# Patient Record
Sex: Male | Born: 1945 | Race: White | Hispanic: No | Marital: Married | Smoking: Former smoker
Health system: Southern US, Community
[De-identification: ages and names within clinical notes are randomized; demographics above are authoritative.]

## PROBLEM LIST (undated history)

## (undated) DIAGNOSIS — M1990 Unspecified osteoarthritis, unspecified site: Secondary | ICD-10-CM

## (undated) DIAGNOSIS — E119 Type 2 diabetes mellitus without complications: Secondary | ICD-10-CM

## (undated) DIAGNOSIS — I10 Essential (primary) hypertension: Secondary | ICD-10-CM

## (undated) DIAGNOSIS — I739 Peripheral vascular disease, unspecified: Secondary | ICD-10-CM

## (undated) DIAGNOSIS — H409 Unspecified glaucoma: Secondary | ICD-10-CM

## (undated) DIAGNOSIS — Z8619 Personal history of other infectious and parasitic diseases: Secondary | ICD-10-CM

## (undated) DIAGNOSIS — K219 Gastro-esophageal reflux disease without esophagitis: Secondary | ICD-10-CM

## (undated) DIAGNOSIS — J45909 Unspecified asthma, uncomplicated: Secondary | ICD-10-CM

## (undated) DIAGNOSIS — C801 Malignant (primary) neoplasm, unspecified: Secondary | ICD-10-CM

## (undated) DIAGNOSIS — E785 Hyperlipidemia, unspecified: Secondary | ICD-10-CM

## (undated) HISTORY — PX: HERNIA REPAIR: SHX51

---

## 2006-11-10 ENCOUNTER — Ambulatory Visit: Payer: Self-pay | Admitting: Gastroenterology

## 2014-06-02 ENCOUNTER — Ambulatory Visit: Payer: Medicare Other | Admitting: Anesthesiology

## 2014-06-02 ENCOUNTER — Encounter
Admission: RE | Disposition: A | Discharge status: Home / Self Care | Payer: Self-pay | Source: Ambulatory Visit | Attending: Gastroenterology

## 2014-06-02 ENCOUNTER — Encounter: Payer: Self-pay | Admitting: *Deleted

## 2014-06-02 ENCOUNTER — Ambulatory Visit
Admission: RE | Admit: 2014-06-02 | Discharge: 2014-06-02 | Disposition: A | Discharge status: Home / Self Care | Payer: Medicare Other | Source: Ambulatory Visit | Attending: Gastroenterology | Admitting: Gastroenterology

## 2014-06-02 DIAGNOSIS — K5730 Diverticulosis of large intestine without perforation or abscess without bleeding: Secondary | ICD-10-CM | POA: Insufficient documentation

## 2014-06-02 DIAGNOSIS — K640 First degree hemorrhoids: Secondary | ICD-10-CM | POA: Insufficient documentation

## 2014-06-02 DIAGNOSIS — D123 Benign neoplasm of transverse colon: Secondary | ICD-10-CM | POA: Insufficient documentation

## 2014-06-02 DIAGNOSIS — H409 Unspecified glaucoma: Secondary | ICD-10-CM | POA: Insufficient documentation

## 2014-06-02 DIAGNOSIS — E785 Hyperlipidemia, unspecified: Secondary | ICD-10-CM | POA: Diagnosis not present

## 2014-06-02 DIAGNOSIS — Z1211 Encounter for screening for malignant neoplasm of colon: Secondary | ICD-10-CM | POA: Diagnosis present

## 2014-06-02 DIAGNOSIS — Z87891 Personal history of nicotine dependence: Secondary | ICD-10-CM | POA: Insufficient documentation

## 2014-06-02 DIAGNOSIS — I10 Essential (primary) hypertension: Secondary | ICD-10-CM | POA: Insufficient documentation

## 2014-06-02 DIAGNOSIS — K621 Rectal polyp: Secondary | ICD-10-CM | POA: Diagnosis not present

## 2014-06-02 DIAGNOSIS — Z79899 Other long term (current) drug therapy: Secondary | ICD-10-CM | POA: Diagnosis not present

## 2014-06-02 DIAGNOSIS — E119 Type 2 diabetes mellitus without complications: Secondary | ICD-10-CM | POA: Diagnosis not present

## 2014-06-02 DIAGNOSIS — Z87898 Personal history of other specified conditions: Secondary | ICD-10-CM | POA: Insufficient documentation

## 2014-06-02 DIAGNOSIS — Z7982 Long term (current) use of aspirin: Secondary | ICD-10-CM | POA: Diagnosis not present

## 2014-06-02 DIAGNOSIS — I739 Peripheral vascular disease, unspecified: Secondary | ICD-10-CM | POA: Diagnosis not present

## 2014-06-02 HISTORY — DX: Personal history of other infectious and parasitic diseases: Z86.19

## 2014-06-02 HISTORY — PX: COLONOSCOPY: SHX5424

## 2014-06-02 HISTORY — DX: Type 2 diabetes mellitus without complications: E11.9

## 2014-06-02 HISTORY — DX: Unspecified glaucoma: H40.9

## 2014-06-02 HISTORY — DX: Peripheral vascular disease, unspecified: I73.9

## 2014-06-02 HISTORY — DX: Hyperlipidemia, unspecified: E78.5

## 2014-06-02 HISTORY — DX: Essential (primary) hypertension: I10

## 2014-06-02 LAB — GLUCOSE, CAPILLARY: Glucose-Capillary: 275 mg/dL — ABNORMAL HIGH (ref 70–99)

## 2014-06-02 SURGERY — COLONOSCOPY
Anesthesia: General

## 2014-06-02 MED ORDER — PROPOFOL INFUSION 10 MG/ML OPTIME
INTRAVENOUS | Status: DC | PRN
  Administered 2014-06-02: 75 ug/kg/min via INTRAVENOUS

## 2014-06-02 MED ORDER — LACTATED RINGERS IV SOLN
INTRAVENOUS | Status: DC | PRN
  Administered 2014-06-02: 08:00:00 via INTRAVENOUS

## 2014-06-02 MED ORDER — SODIUM CHLORIDE 0.9 % IV SOLN
INTRAVENOUS | Status: DC
  Administered 2014-06-02: 1000 mL via INTRAVENOUS

## 2014-06-02 MED ORDER — MIDAZOLAM HCL 2 MG/2ML IJ SOLN
INTRAMUSCULAR | Status: DC | PRN
  Administered 2014-06-02: 2 mg via INTRAVENOUS

## 2014-06-02 MED ORDER — SODIUM CHLORIDE 0.9 % IV SOLN: INTRAVENOUS | Status: DC

## 2014-06-02 NOTE — Anesthesia Preprocedure Evaluation (Signed)
Anesthesia Evaluation  Patient identified by MRN, date of birth, ID band Patient awake    Reviewed: Allergy & Precautions, H&P , NPO status , Patient's Chart, lab work & pertinent test results, reviewed documented beta blocker date and time   Airway Mallampati: IV  TM Distance: >3 FB Neck ROM: full    Dental no notable dental hx. (+) Teeth Intact   Pulmonary neg pulmonary ROS, former smoker,  breath sounds clear to auscultation  Pulmonary exam normal       Cardiovascular Exercise Tolerance: Good hypertension, + Peripheral Vascular Disease negative cardio ROS  Rhythm:regular Rate:Normal     Neuro/Psych negative neurological ROS  negative psych ROS   GI/Hepatic negative GI ROS, Neg liver ROS,   Endo/Other  negative endocrine ROSdiabetes  Renal/GU negative Renal ROS  negative genitourinary   Musculoskeletal   Abdominal   Peds  Hematology negative hematology ROS (+)   Anesthesia Other Findings   Reproductive/Obstetrics negative OB ROS                             Anesthesia Physical Anesthesia Plan  ASA: II  Anesthesia Plan: General   Post-op Pain Management:    Induction:   Airway Management Planned:   Additional Equipment:   Intra-op Plan:   Post-operative Plan:   Informed Consent: I have reviewed the patients History and Physical, chart, labs and discussed the procedure including the risks, benefits and alternatives for the proposed anesthesia with the patient or authorized representative who has indicated his/her understanding and acceptance.   Dental Advisory Given  Plan Discussed with: CRNA  Anesthesia Plan Comments:         Anesthesia Quick Evaluation

## 2014-06-02 NOTE — Transfer of Care (Signed)
Immediate Anesthesia Transfer of Care Note  Patient: Gerald Clark  Procedure(s) Performed: Procedure(s): COLONOSCOPY (N/A)  Patient Location: PACU  Anesthesia Type:General  Level of Consciousness: awake, alert  and oriented  Airway & Oxygen Therapy: Patient Spontanous Breathing  Post-op Assessment: Report given to RN  Post vital signs: Reviewed and stable  Last Vitals:  Filed Vitals:   06/02/14 0715  BP: 110/62  Pulse: 85  Temp: 36.3 C  Resp: 16    Complications: No apparent anesthesia complications

## 2014-06-02 NOTE — Discharge Instructions (Signed)

## 2014-06-02 NOTE — H&P (Signed)
Primary Care Physician:  Adrian Prows, MD  Pre-Procedure History & Physical: HPI:  Gerald Clark is a 69 y.o. male is here for an colonoscopy.   Past Medical History  Diagnosis Date  . Glaucoma   . Diabetes mellitus without complication   . Hypertension   . Hyperlipemia   . Peripheral vascular disease   . History of chicken pox     History reviewed. No pertinent past surgical history.  Prior to Admission medications   Medication Sig Start Date End Date Taking? Authorizing Provider  amLODipine (NORVASC) 5 MG tablet Take 5 mg by mouth daily.   Yes Historical Provider, MD  aspirin 81 MG tablet Take 81 mg by mouth daily.   Yes Historical Provider, MD  brimonidine (ALPHAGAN) 0.2 % ophthalmic solution Place 1 drop into both eyes 2 (two) times daily.   Yes Historical Provider, MD  dorzolamide (TRUSOPT) 2 % ophthalmic solution Place 1 drop into both eyes 2 (two) times daily.   Yes Historical Provider, MD  glipiZIDE (GLUCOTROL) 10 MG tablet Take 10 mg by mouth daily before breakfast.   Yes Historical Provider, MD  hydrochlorothiazide (HYDRODIURIL) 25 MG tablet Take 25 mg by mouth daily.   Yes Historical Provider, MD  latanoprost (XALATAN) 0.005 % ophthalmic solution Place 1 drop into both eyes at bedtime.   Yes Historical Provider, MD  lisinopril (PRINIVIL,ZESTRIL) 40 MG tablet Take 40 mg by mouth daily.   Yes Historical Provider, MD  metFORMIN (GLUCOPHAGE) 1000 MG tablet Take 1,000 mg by mouth 2 (two) times daily with a meal.   Yes Historical Provider, MD  niacin 500 MG CR capsule Take 250 mg by mouth at bedtime.   Yes Historical Provider, MD  Polyethyl Glycol-Propyl Glycol (SYSTANE ULTRA OP) Apply 1 drop to eye 3 (three) times daily as needed (Dry eyes).   Yes Historical Provider, MD  simvastatin (ZOCOR) 40 MG tablet Take 40 mg by mouth daily.   Yes Historical Provider, MD  timolol (TIMOPTIC) 0.5 % ophthalmic solution Place 1 drop into both eyes 2 (two) times daily.   Yes  Historical Provider, MD    Allergies as of 05/08/2014  . (Not on File)    History reviewed. No pertinent family history.  History   Social History  . Marital Status: Married    Spouse Name: N/A  . Number of Children: N/A  . Years of Education: N/A   Occupational History  . Not on file.   Social History Main Topics  . Smoking status: Former Research scientist (life sciences)  . Smokeless tobacco: Not on file  . Alcohol Use: 3.0 oz/week    5 Cans of beer per week  . Drug Use: Not on file  . Sexual Activity: Not on file   Other Topics Concern  . Not on file   Social History Narrative     Physical Exam: BP 110/62 mmHg  Pulse 85  Temp(Src) 97.4 F (36.3 C) (Tympanic)  Resp 16  Ht 5\' 7"  (1.702 m)  Wt 179 lb (81.194 kg)  BMI 28.03 kg/m2  SpO2 95% General:   Alert,  pleasant and cooperative in NAD Head:  Normocephalic and atraumatic. Neck:  Supple; no masses or thyromegaly. Lungs:  Clear throughout to auscultation.    Heart:  Regular rate and rhythm. Abdomen:  Soft, nontender and nondistended. Normal bowel sounds, without guarding, and without rebound.   Neurologic:  Alert and  oriented x4;  grossly normal neurologically.  Impression/Plan: Gerald Clark is here for an colonoscopy to  be performed for polyp surveillance  Risks, benefits, limitations, and alternatives regarding  colonoscopy have been reviewed with the patient.  Questions have been answered.  All parties agreeable.   Josefine Class, MD  06/02/2014, 8:02 AM

## 2014-06-02 NOTE — Anesthesia Postprocedure Evaluation (Signed)
  Anesthesia Post-op Note  Patient: Gerald Clark  Procedure(s) Performed: Procedure(s): COLONOSCOPY (N/A)  Anesthesia type:General  Patient location: PACU  Post pain: Pain level controlled  Post assessment: Post-op Vital signs reviewed, Patient's Cardiovascular Status Stable, Respiratory Function Stable, Patent Airway and No signs of Nausea or vomiting  Post vital signs: Reviewed and stable  Last Vitals:  Filed Vitals:   06/02/14 0910  BP: 107/68  Pulse:   Temp:   Resp:     Level of consciousness: awake, alert  and patient cooperative  Complications: No apparent anesthesia complications

## 2014-06-02 NOTE — Op Note (Signed)
[facility] Patient Name: Gerald Clark Procedure Date: 06/02/2014 8:10 AM MRN: [id number] Account #: [id number] Date of Birth: [birth year] Admit Type: Outpatient Age: 69 Room: [facility] ENDO ROOM 2 Gender: Male Note Status: Finalized Procedure:         Colonoscopy Indications:       High risk colon cancer surveillance: Personal history of                     adenoma less than 10 mm in size, Last colonoscopy: 2008 Patient Profile:   This is a 69 year old male. Providers:         Gerrit Heck. Rayann Heman, MD Referring MD:      Youlanda Roys. Ola Spurr, MD (Referring MD) Medicines:         Propofol per Anesthesia Complications:     No immediate complications. Procedure:         Pre-Anesthesia Assessment:                    - Prior to the procedure, a History and Physical was                     performed, and patient medications and allergies were                     reviewed. The patient is competent. The risks and benefits                     of the procedure and the sedation options and risks were                     discussed with the patient. All questions were answered                     and informed consent was obtained. Patient identification                     and proposed procedure were verified by the physician and                     the nurse in the pre-procedure area. Mental Status                     Examination: alert and oriented. Airway Examination:                     normal oropharyngeal airway and neck mobility. Respiratory                     Examination: clear to auscultation. CV Examination: RRR,                     no murmurs, no S3 or S4. Prophylactic Antibiotics: The                     patient does not require prophylactic antibiotics. Prior                     Anticoagulants: The patient has taken aspirin, last dose                     was 1 day prior to procedure. ASA Grade Assessment: II - A  patient with mild  systemic disease. After reviewing the                     risks and benefits, the patient was deemed in satisfactory                     condition to undergo the procedure. The anesthesia plan                     was to use monitored anesthesia care (MAC). Immediately                     prior to administration of medications, the patient was                     re-assessed for adequacy to receive sedatives. The heart                     rate, respiratory rate, oxygen saturations, blood                     pressure, adequacy of pulmonary ventilation, and response                     to care were monitored throughout the procedure. The                     physical status of the patient was re-assessed after the                     procedure.                    - Prior to the procedure, a History and Physical was                     performed, and patient medications, allergies and                     sensitivities were reviewed. The patient's tolerance of                     previous anesthesia was reviewed.                    After obtaining informed consent, the colonoscope was                     passed under direct vision. Throughout the procedure, the                     patient's blood pressure, pulse, and oxygen saturations                     were monitored continuously. The Olympus CF-H180AL                     colonoscope ( S#: [id number] ) was introduced through the                     anus and advanced to the the cecum, identified by                     appendiceal orifice and ileocecal valve. The colonoscopy                     was  performed without difficulty. The patient tolerated                     the procedure well. The quality of the bowel preparation                     was excellent. Findings:      The perianal and digital rectal examinations were normal.      Two sessile polyps were found in the transverse colon. The polyps were 2       to 3 mm in size. These polyps  were removed with a jumbo cold forceps.       Resection and retrieval were complete.      Three sessile polyps were found in the rectum. The polyps were 1 to 2 mm       in size. These polyps were removed with a jumbo cold forceps. Resection       and retrieval were complete.      A few small and large-mouthed diverticula were found in the sigmoid       colon.      Internal hemorrhoids were found during retroflexion. The hemorrhoids       were Grade I (internal hemorrhoids that do not prolapse).      The exam was otherwise without abnormality. Impression:        - Two 2 to 3 mm polyps in the transverse colon. Resected                     and retrieved.                    - Three 1 to 2 mm polyps in the rectum. Resected and                     retrieved.                    - Diverticulosis in the sigmoid colon, one diverticulum in                     asc colon.                    - Internal hemorrhoids.                    - The examination was otherwise normal. Recommendation:    - Observe patient in GI recovery unit.                    - High fiber diet.                    - Continue present medications.                    - Await pathology results.                    - Repeat colonoscopy for surveillance based on pathology                     results, no later than 5 years.                    - Return to referring physician.                    - The  findings and recommendations were discussed with the                     patient.                    - The findings and recommendations were discussed with the                     patient's family. Procedure Code(s): --- Professional ---                    [phone], Colonoscopy, flexible; with biopsy, single or                     multiple CPT copyright 2014 American Medical Association. All rights reserved. The codes documented in this report are preliminary and upon coder review may  be revised to meet current compliance  requirements. Mellody Life, MD 06/02/2014 8:39:26 AM This report has been signed electronically. Number of Addenda: 0 Note Initiated On: 06/02/2014 8:10 AM Scope Withdrawal Time: 0 hours 11 minutes 51 seconds  Total Procedure Duration: 0 hours 18 minutes 36 seconds       [facility]

## 2014-06-03 LAB — SURGICAL PATHOLOGY

## 2014-06-03 NOTE — Progress Notes (Signed)
Patient phone number that was left for call back was answered for as a fax machine

## 2014-06-04 ENCOUNTER — Encounter: Payer: Self-pay | Admitting: Gastroenterology

## 2014-06-10 ENCOUNTER — Emergency Department
Admission: EM | Admit: 2014-06-10 | Discharge: 2014-06-10 | Disposition: A | Discharge status: Home / Self Care | Payer: Medicare Other | Attending: Emergency Medicine | Admitting: Emergency Medicine

## 2014-06-10 ENCOUNTER — Encounter: Payer: Self-pay | Admitting: Emergency Medicine

## 2014-06-10 DIAGNOSIS — I10 Essential (primary) hypertension: Secondary | ICD-10-CM | POA: Diagnosis not present

## 2014-06-10 DIAGNOSIS — Z87891 Personal history of nicotine dependence: Secondary | ICD-10-CM | POA: Insufficient documentation

## 2014-06-10 DIAGNOSIS — W311XXA Contact with metalworking machines, initial encounter: Secondary | ICD-10-CM | POA: Insufficient documentation

## 2014-06-10 DIAGNOSIS — Z79899 Other long term (current) drug therapy: Secondary | ICD-10-CM | POA: Insufficient documentation

## 2014-06-10 DIAGNOSIS — E119 Type 2 diabetes mellitus without complications: Secondary | ICD-10-CM | POA: Insufficient documentation

## 2014-06-10 DIAGNOSIS — Y998 Other external cause status: Secondary | ICD-10-CM | POA: Diagnosis not present

## 2014-06-10 DIAGNOSIS — Z23 Encounter for immunization: Secondary | ICD-10-CM | POA: Insufficient documentation

## 2014-06-10 DIAGNOSIS — Y92009 Unspecified place in unspecified non-institutional (private) residence as the place of occurrence of the external cause: Secondary | ICD-10-CM | POA: Insufficient documentation

## 2014-06-10 DIAGNOSIS — S61412A Laceration without foreign body of left hand, initial encounter: Secondary | ICD-10-CM | POA: Diagnosis present

## 2014-06-10 DIAGNOSIS — Z7982 Long term (current) use of aspirin: Secondary | ICD-10-CM | POA: Diagnosis not present

## 2014-06-10 DIAGNOSIS — S61211A Laceration without foreign body of left index finger without damage to nail, initial encounter: Secondary | ICD-10-CM | POA: Insufficient documentation

## 2014-06-10 DIAGNOSIS — Y9389 Activity, other specified: Secondary | ICD-10-CM | POA: Insufficient documentation

## 2014-06-10 DIAGNOSIS — S61219A Laceration without foreign body of unspecified finger without damage to nail, initial encounter: Secondary | ICD-10-CM

## 2014-06-10 MED ORDER — LIDOCAINE HCL (PF) 1 % IJ SOLN
INTRAMUSCULAR | Status: AC
  Administered 2014-06-10: 5 mL
  Filled 2014-06-10: qty 5

## 2014-06-10 MED ORDER — TETANUS-DIPHTH-ACELL PERTUSSIS 5-2.5-18.5 LF-MCG/0.5 IM SUSP
INTRAMUSCULAR | Status: AC
  Administered 2014-06-10: 0.5 mL via INTRAMUSCULAR
  Filled 2014-06-10: qty 0.5

## 2014-06-10 MED ORDER — LIDOCAINE HCL 1 % IJ SOLN: 5.0000 mL | Freq: Once | INTRAMUSCULAR | Status: AC

## 2014-06-10 MED ORDER — TETANUS-DIPHTH-ACELL PERTUSSIS 5-2.5-18.5 LF-MCG/0.5 IM SUSP
0.5000 mL | Freq: Once | INTRAMUSCULAR | Status: AC
  Administered 2014-06-10: 0.5 mL via INTRAMUSCULAR

## 2014-06-10 NOTE — ED Notes (Signed)
Patient to ED with laceration to 2nd digit left hand, reports cutting with wire brush on electric grinder.

## 2014-06-10 NOTE — ED Provider Notes (Signed)
[facility] Emergency Department Provider Note  ____________________________________________  Time seen: Approximately 2122 PM  I have reviewed the triage vital signs and the nursing notes.   HISTORY  Chief Complaint Extremity Laceration    HPI Gerald Clark is a 69 y.o. male presents to the ER after sustaining a laceration to his left second digit while using a grinding wheel while at home.   Past Medical History  Diagnosis Date  . Glaucoma   . Diabetes mellitus without complication   . Hypertension   . Hyperlipemia   . Peripheral vascular disease   . History of chicken pox     There are no active problems to display for this patient.   Past Surgical History  Procedure Laterality Date  . Colonoscopy N/A 06/02/2014    Procedure: COLONOSCOPY;  Surgeon: Josefine Class, MD;  Location: [facility] ENDOSCOPY;  Service: Endoscopy;  Laterality: N/A;    Current Outpatient Rx  Name  Route  Sig  Dispense  Refill  . amLODipine (NORVASC) 5 MG tablet   Oral   Take 5 mg by mouth daily.         Marland Kitchen aspirin 81 MG tablet   Oral   Take 81 mg by mouth daily.         . brimonidine (ALPHAGAN) 0.2 % ophthalmic solution   Both Eyes   Place 1 drop into both eyes 2 (two) times daily.         . dorzolamide (TRUSOPT) 2 % ophthalmic solution   Both Eyes   Place 1 drop into both eyes 2 (two) times daily.         Marland Kitchen glipiZIDE (GLUCOTROL) 10 MG tablet   Oral   Take 10 mg by mouth daily before breakfast.         . hydrochlorothiazide (HYDRODIURIL) 25 MG tablet   Oral   Take 25 mg by mouth daily.         Marland Kitchen latanoprost (XALATAN) 0.005 % ophthalmic solution   Both Eyes   Place 1 drop into both eyes at bedtime.         Marland Kitchen lisinopril (PRINIVIL,ZESTRIL) 40 MG tablet   Oral   Take 40 mg by mouth daily.         . metFORMIN (GLUCOPHAGE) 1000 MG tablet   Oral   Take 1,000 mg by mouth 2 (two) times daily with a meal.         . niacin 500 MG CR  capsule   Oral   Take 250 mg by mouth at bedtime.         Vladimir Faster Glycol-Propyl Glycol (SYSTANE ULTRA OP)   Ophthalmic   Apply 1 drop to eye 3 (three) times daily as needed (Dry eyes).         . simvastatin (ZOCOR) 40 MG tablet   Oral   Take 40 mg by mouth daily.         . timolol (TIMOPTIC) 0.5 % ophthalmic solution   Both Eyes   Place 1 drop into both eyes 2 (two) times daily.           Allergies Review of patient's allergies indicates no known allergies.  History reviewed. No pertinent family history.  Social History History  Substance Use Topics  . Smoking status: Former Research scientist (life sciences)  . Smokeless tobacco: Never Used  . Alcohol Use: 3.0 oz/week    5 Cans of beer per week    Review of Systems Constitutional: No fever/chills  Musculoskeletal: Negative for  pain with the exception of the left second digit. Skin: Negative for rash. Neurological: Negative for headaches, focal weakness or numbness.  10-point ROS otherwise negative.  ____________________________________________   PHYSICAL EXAM:  VITAL SIGNS: ED Triage Vitals  Enc Vitals Group     BP 06/10/14 1618 170/80 mmHg     Pulse Rate 06/10/14 1618 97     Resp 06/10/14 1618 101     Temp 06/10/14 1618 94.8 F (34.9 C)     Temp Source 06/10/14 1618 Temporal     SpO2 06/10/14 1618 98 %     Weight 06/10/14 1618 181 lb (82.101 kg)     Height 06/10/14 1618 5\' 7"  (1.702 m)     Head Cir --      Peak Flow --      Pain Score --      Pain Loc --      Pain Edu? --      Excl. in [place]? --     Constitutional: Alert and oriented. Well appearing and in no acute distress. Eyes: Conjunctivae are normal. PERRL. EOMI. Head: Atraumatic. Nose: No congestion/rhinnorhea. Mouth/Throat: Mucous membranes are moist.   Neck: No stridor.   Cardiovascular:   Good peripheral circulation. Respiratory: Normal respiratory effort.  Gastrointestinal: Soft and nontender. No distention. No abdominal bruits. No CVA  tenderness. Musculoskeletal: Full range of motion of left second digit. Neurologic:  Normal speech and language. No gross focal neurologic deficits are appreciated. Speech is normal. No gait instability. No sensory impairment of the second digit. Skin:  Skin is warm, dry and intact. No rash noted. 4 cm laceration present to the dorsal surface of the second digit, tendon visualized intact. Psychiatric: Mood and affect are normal. Speech and behavior are normal.  ____________________________________________   LABS (all labs ordered are listed, but only abnormal results are displayed)  Labs Reviewed - No data to display ____________________________________________  EKG   ____________________________________________  RADIOLOGY   ____________________________________________   PROCEDURES  Procedure(s) performed: LACERATION REPAIR Performed by: Sherrie George Authorized by: Sherrie George Consent: Verbal consent obtained. Risks and benefits: risks, benefits and alternatives were discussed Consent given by: patient Prepped and Draped in normal sterile fashion Wound explored  Laceration Location: left hand dorsal surface of 2nd digit    Laceration Length: 4cm  No Foreign Bodies seen or palpated  Anesthesia: local infiltration  Local anesthetic: lidocaine 1% 0 epinephrine  Anesthetic total: 4 ml  Irrigation method: syringe Amount of cleaning: standard  Skin closure: closely approximated  Number of sutures: 5  Technique: simple interrupted   Patient tolerance: Patient tolerated the procedure well with no immediate complications.  Critical Care performed: No  ____________________________________________   INITIAL IMPRESSION / ASSESSMENT AND PLAN / ED COURSE  Pertinent labs & imaging results that were available during my care of the patient were reviewed by me and considered in my medical decision making (see chart for details).  Patient was advised to return in  10 days for suture removal or sooner for symptoms of concern. Wound care discussed. ____________________________________________   FINAL CLINICAL IMPRESSION(S) / ED DIAGNOSES  Final diagnoses:  Laceration of left hand without complication, including fingers, initial encounter      Victorino Dike, FNP 06/10/14 2320  Nance Pear, MD 06/10/14 [phone]

## 2016-06-17 ENCOUNTER — Encounter
Admission: RE | Admit: 2016-06-17 | Discharge: 2016-06-17 | Disposition: A | Discharge status: Home / Self Care | Payer: Medicare Other | Source: Ambulatory Visit | Attending: Surgery | Admitting: Surgery

## 2016-06-17 DIAGNOSIS — I10 Essential (primary) hypertension: Secondary | ICD-10-CM | POA: Diagnosis not present

## 2016-06-17 DIAGNOSIS — Z01810 Encounter for preprocedural cardiovascular examination: Secondary | ICD-10-CM | POA: Diagnosis present

## 2016-06-17 HISTORY — DX: Unspecified osteoarthritis, unspecified site: M19.90

## 2016-06-17 HISTORY — DX: Malignant (primary) neoplasm, unspecified: C80.1

## 2016-06-17 HISTORY — DX: Gastro-esophageal reflux disease without esophagitis: K21.9

## 2016-06-17 HISTORY — DX: Unspecified asthma, uncomplicated: J45.909

## 2016-06-17 NOTE — Patient Instructions (Signed)
  Your procedure is scheduled on: June 28, 2016 [facility]) Report to Same Day Surgery 2nd floor medical mall ([place] Entrance-take elevator on left to 2nd floor.  Check in with surgery information desk.) To find out your arrival time please call [phone] between 1PM - 3PM on June 27, 2016 (MONDAY)  Remember: Instructions that are not followed completely may result in serious medical risk, up to and including death, or upon the discretion of your surgeon and anesthesiologist your surgery may need to be rescheduled.    _x___ 1. Do not eat food or drink liquids after midnight. No gum chewing or hard candies                               __x__ 2. No Alcohol for 24 hours before or after surgery.   __x__3. No Smoking for 24 prior to surgery.   ____  4. Bring all medications with you on the day of surgery if instructed.    __x__ 5. Notify your doctor if there is any change in your medical condition     (cold, fever, infections).     Do not wear jewelry, make-up, hairpins, clips or nail polish.  Do not wear lotions, powders, or perfumes.   Do not shave 48 hours prior to surgery. Men may shave face and neck.  Do not bring valuables to the hospital.    [facility] is not responsible for any belongings or valuables.               Contacts, dentures or bridgework may not be worn into surgery.  Leave your suitcase in the car. After surgery it may be brought to your room.  For patients admitted to the hospital, discharge time is determined by your  treatment team                     Patients discharged the day of surgery will not be allowed to drive home.  You will need someone to drive you home and stay with you the night of your procedure.    Please read over the following fact sheets that you were given:   [facility] Preparing for Surgery and or MRSA Information   _x___ Take the following medication the morning of surgery with a sip of water:   1. AMLODIPINE    ____Fleets enema or  Magnesium Citrate as directed.   _x___ Use CHG Soap or sage wipes as directed on instruction sheet   ____ Use inhalers on the day of surgery and bring to hospital day of surgery  __x__ Stop Metformin and Janumet 2 days prior to surgery. (STOP METFORMIN ON JUNE  3 )  ____ Take 1/2 of usual insulin dose the night before surgery and none on the morning     surgery.   _x___ Follow recommendations from Cardiologist, Pulmonologist or PCP regarding          stopping Aspirin, Coumadin, Pllavix ,Eliquis, Effient, or Pradaxa, and Pletal. (STOP ASPIRIN ONE WEEK PRIOR TO SURGERY )  X____Stop Anti-inflammatories such as Advil, Aleve, Ibuprofen, Motrin, Naproxen, Naprosyn, Goodies powders or aspirin products. OK to take Tylenol   _x___ Stop supplements until after surgery.  But may continue Vitamin D, Vitamin B, and multivitamin (STOP CINNAMON, AND CHROMIUM PICOLINATE NOW )       ____ Bring C-Pap to the hospital.

## 2016-06-28 ENCOUNTER — Ambulatory Visit
Admission: RE | Admit: 2016-06-28 | Discharge: 2016-06-28 | Disposition: A | Discharge status: Home / Self Care | Payer: Medicare Other | Source: Ambulatory Visit | Attending: Surgery | Admitting: Surgery

## 2016-06-28 ENCOUNTER — Ambulatory Visit: Payer: Medicare Other | Admitting: Anesthesiology

## 2016-06-28 ENCOUNTER — Encounter: Payer: Self-pay | Admitting: *Deleted

## 2016-06-28 ENCOUNTER — Encounter
Admission: RE | Disposition: A | Discharge status: Home / Self Care | Payer: Self-pay | Source: Ambulatory Visit | Attending: Surgery

## 2016-06-28 DIAGNOSIS — Z7982 Long term (current) use of aspirin: Secondary | ICD-10-CM | POA: Insufficient documentation

## 2016-06-28 DIAGNOSIS — E1151 Type 2 diabetes mellitus with diabetic peripheral angiopathy without gangrene: Secondary | ICD-10-CM | POA: Insufficient documentation

## 2016-06-28 DIAGNOSIS — K4090 Unilateral inguinal hernia, without obstruction or gangrene, not specified as recurrent: Secondary | ICD-10-CM | POA: Diagnosis present

## 2016-06-28 DIAGNOSIS — D176 Benign lipomatous neoplasm of spermatic cord: Secondary | ICD-10-CM | POA: Insufficient documentation

## 2016-06-28 DIAGNOSIS — Z79899 Other long term (current) drug therapy: Secondary | ICD-10-CM | POA: Diagnosis not present

## 2016-06-28 DIAGNOSIS — Z87891 Personal history of nicotine dependence: Secondary | ICD-10-CM | POA: Insufficient documentation

## 2016-06-28 DIAGNOSIS — I10 Essential (primary) hypertension: Secondary | ICD-10-CM | POA: Diagnosis not present

## 2016-06-28 DIAGNOSIS — E785 Hyperlipidemia, unspecified: Secondary | ICD-10-CM | POA: Diagnosis not present

## 2016-06-28 DIAGNOSIS — Z7984 Long term (current) use of oral hypoglycemic drugs: Secondary | ICD-10-CM | POA: Diagnosis not present

## 2016-06-28 DIAGNOSIS — H409 Unspecified glaucoma: Secondary | ICD-10-CM | POA: Diagnosis not present

## 2016-06-28 HISTORY — PX: INGUINAL HERNIA REPAIR: SHX194

## 2016-06-28 LAB — GLUCOSE, CAPILLARY
Glucose-Capillary: 214 mg/dL — ABNORMAL HIGH (ref 65–99)
Glucose-Capillary: 171 mg/dL — ABNORMAL HIGH (ref 65–99)

## 2016-06-28 SURGERY — REPAIR, HERNIA, INGUINAL, ADULT
Anesthesia: General | Site: Inguinal | Laterality: Left | Wound class: Clean Contaminated

## 2016-06-28 MED ORDER — ROCURONIUM BROMIDE 50 MG/5ML IV SOLN
INTRAVENOUS | Status: AC
  Filled 2016-06-28: qty 1

## 2016-06-28 MED ORDER — FENTANYL CITRATE (PF) 100 MCG/2ML IJ SOLN
25.0000 ug | INTRAMUSCULAR | Status: DC | PRN
  Administered 2016-06-28 (×4): 25 ug via INTRAVENOUS

## 2016-06-28 MED ORDER — CEFAZOLIN SODIUM-DEXTROSE 2-4 GM/100ML-% IV SOLN
INTRAVENOUS | Status: AC
  Filled 2016-06-28: qty 100

## 2016-06-28 MED ORDER — FAMOTIDINE 20 MG PO TABS
ORAL_TABLET | ORAL | Status: AC
  Filled 2016-06-28: qty 1

## 2016-06-28 MED ORDER — ONDANSETRON HCL 4 MG/2ML IJ SOLN
INTRAMUSCULAR | Status: DC | PRN
  Administered 2016-06-28: 4 mg via INTRAVENOUS

## 2016-06-28 MED ORDER — FAMOTIDINE 20 MG PO TABS
20.0000 mg | ORAL_TABLET | Freq: Once | ORAL | Status: AC
  Administered 2016-06-28: 20 mg via ORAL

## 2016-06-28 MED ORDER — HYDROCODONE-ACETAMINOPHEN 5-325 MG PO TABS: 1.0000 | ORAL_TABLET | ORAL | 0 refills | Status: DC | PRN

## 2016-06-28 MED ORDER — HYDROCODONE-ACETAMINOPHEN 5-325 MG PO TABS
1.0000 | ORAL_TABLET | ORAL | Status: DC | PRN
  Administered 2016-06-28: 1 via ORAL

## 2016-06-28 MED ORDER — PHENYLEPHRINE HCL 10 MG/ML IJ SOLN
INTRAMUSCULAR | Status: DC | PRN
  Administered 2016-06-28 (×6): 100 ug via INTRAVENOUS

## 2016-06-28 MED ORDER — PROPOFOL 10 MG/ML IV BOLUS
INTRAVENOUS | Status: AC
  Filled 2016-06-28: qty 20

## 2016-06-28 MED ORDER — FENTANYL CITRATE (PF) 100 MCG/2ML IJ SOLN
INTRAMUSCULAR | Status: AC
  Filled 2016-06-28: qty 2

## 2016-06-28 MED ORDER — SODIUM CHLORIDE 0.9 % IV SOLN
INTRAVENOUS | Status: DC
  Administered 2016-06-28 (×2): via INTRAVENOUS

## 2016-06-28 MED ORDER — BUPIVACAINE-EPINEPHRINE (PF) 0.5% -1:200000 IJ SOLN
INTRAMUSCULAR | Status: DC | PRN
  Administered 2016-06-28: 22 mL via PERINEURAL

## 2016-06-28 MED ORDER — PROPOFOL 10 MG/ML IV BOLUS
INTRAVENOUS | Status: DC | PRN
  Administered 2016-06-28: 150 mg via INTRAVENOUS

## 2016-06-28 MED ORDER — ONDANSETRON HCL 4 MG/2ML IJ SOLN: 4.0000 mg | Freq: Once | INTRAMUSCULAR | Status: DC | PRN

## 2016-06-28 MED ORDER — FENTANYL CITRATE (PF) 100 MCG/2ML IJ SOLN
INTRAMUSCULAR | Status: DC | PRN
  Administered 2016-06-28: 50 ug via INTRAVENOUS
  Administered 2016-06-28: 100 ug via INTRAVENOUS
  Administered 2016-06-28: 50 ug via INTRAVENOUS

## 2016-06-28 MED ORDER — CEFAZOLIN SODIUM-DEXTROSE 2-4 GM/100ML-% IV SOLN
2.0000 g | Freq: Once | INTRAVENOUS | Status: AC
  Administered 2016-06-28: 2 g via INTRAVENOUS

## 2016-06-28 MED ORDER — ROCURONIUM BROMIDE 100 MG/10ML IV SOLN
INTRAVENOUS | Status: DC | PRN
  Administered 2016-06-28: 10 mg via INTRAVENOUS
  Administered 2016-06-28: 15 mg via INTRAVENOUS
  Administered 2016-06-28: 5 mg via INTRAVENOUS

## 2016-06-28 MED ORDER — EPHEDRINE SULFATE 50 MG/ML IJ SOLN
INTRAMUSCULAR | Status: DC | PRN
  Administered 2016-06-28: 5 mg via INTRAVENOUS
  Administered 2016-06-28: 10 mg via INTRAVENOUS

## 2016-06-28 MED ORDER — HYDROCODONE-ACETAMINOPHEN 5-325 MG PO TABS
ORAL_TABLET | ORAL | Status: AC
  Filled 2016-06-28: qty 1

## 2016-06-28 MED ORDER — SUCCINYLCHOLINE CHLORIDE 20 MG/ML IJ SOLN
INTRAMUSCULAR | Status: DC | PRN
  Administered 2016-06-28: 100 mg via INTRAVENOUS

## 2016-06-28 MED ORDER — SUGAMMADEX SODIUM 200 MG/2ML IV SOLN
INTRAVENOUS | Status: DC | PRN
  Administered 2016-06-28: 200 mg via INTRAVENOUS

## 2016-06-28 MED ORDER — BUPIVACAINE-EPINEPHRINE (PF) 0.5% -1:200000 IJ SOLN
INTRAMUSCULAR | Status: AC
  Filled 2016-06-28: qty 30

## 2016-06-28 MED ORDER — LIDOCAINE HCL (PF) 2 % IJ SOLN
INTRAMUSCULAR | Status: AC
  Filled 2016-06-28: qty 2

## 2016-06-28 MED ORDER — SUGAMMADEX SODIUM 200 MG/2ML IV SOLN
INTRAVENOUS | Status: AC
  Filled 2016-06-28: qty 2

## 2016-06-28 MED ORDER — LIDOCAINE HCL (CARDIAC) 20 MG/ML IV SOLN
INTRAVENOUS | Status: DC | PRN
  Administered 2016-06-28: 80 mg via INTRAVENOUS

## 2016-06-28 SURGICAL SUPPLY — 33 items
BLADE CLIPPER SURG (BLADE) ×2 IMPLANT
BLADE SURG 15 STRL LF DISP TIS (BLADE) ×1 IMPLANT
BLADE SURG 15 STRL SS (BLADE) ×1
CANISTER SUCT 1200ML W/VALVE (MISCELLANEOUS) ×2 IMPLANT
CHLORAPREP W/TINT 26ML (MISCELLANEOUS) ×2 IMPLANT
CLEANER CAUTERY TIP 5X5 PAD (MISCELLANEOUS) ×1 IMPLANT
DERMABOND ADVANCED (GAUZE/BANDAGES/DRESSINGS) ×1
DERMABOND ADVANCED .7 DNX12 (GAUZE/BANDAGES/DRESSINGS) ×1 IMPLANT
DRAIN PENROSE 5/8X18 LTX STRL (WOUND CARE) ×2 IMPLANT
DRAPE LAPAROTOMY 77X122 PED (DRAPES) ×2 IMPLANT
ELECT REM PT RETURN 9FT ADLT (ELECTROSURGICAL) ×2
ELECTRODE REM PT RTRN 9FT ADLT (ELECTROSURGICAL) ×1 IMPLANT
GLOVE BIO SURGEON STRL SZ 6.5 (GLOVE) ×4 IMPLANT
GLOVE BIO SURGEON STRL SZ7.5 (GLOVE) ×2 IMPLANT
GLOVE BIOGEL PI IND STRL 7.0 (GLOVE) ×3 IMPLANT
GLOVE BIOGEL PI INDICATOR 7.0 (GLOVE) ×3
GOWN STRL REUS W/ TWL LRG LVL3 (GOWN DISPOSABLE) ×3 IMPLANT
GOWN STRL REUS W/TWL LRG LVL3 (GOWN DISPOSABLE) ×3
KIT RM TURNOVER STRD PROC AR (KITS) ×2 IMPLANT
LABEL OR SOLS (LABEL) ×2 IMPLANT
MESH SYNTHETIC 4X6 SOFT BARD (Mesh General) ×1 IMPLANT
MESH SYNTHETIC SOFT BARD 4X6 (Mesh General) ×1 IMPLANT
NEEDLE HYPO 25X1 1.5 SAFETY (NEEDLE) ×2 IMPLANT
NS IRRIG 500ML POUR BTL (IV SOLUTION) ×2 IMPLANT
PACK BASIN MINOR ARMC (MISCELLANEOUS) ×2 IMPLANT
PAD CLEANER CAUTERY TIP 5X5 (MISCELLANEOUS) ×1
PENCIL ELECTRO HAND CTR (MISCELLANEOUS) ×2 IMPLANT
SUT CHROMIC 4 0 RB 1X27 (SUTURE) ×2 IMPLANT
SUT MNCRL AB 4-0 PS2 18 (SUTURE) ×2 IMPLANT
SUT SURGILON 0 30 BLK (SUTURE) ×4 IMPLANT
SUT VIC AB 4-0 SH 27 (SUTURE) ×2
SUT VIC AB 4-0 SH 27XANBCTRL (SUTURE) ×2 IMPLANT
SYRINGE 10CC LL (SYRINGE) ×2 IMPLANT

## 2016-06-28 NOTE — Transfer of Care (Signed)
Immediate Anesthesia Transfer of Care Note  Patient: Gerald Clark  Procedure(s) Performed: Procedure(s): HERNIA REPAIR INGUINAL ADULT WITH MESH (Left)  Patient Location: PACU  Anesthesia Type:General  Level of Consciousness: sedated  Airway & Oxygen Therapy: Patient Spontanous Breathing and Patient connected to face mask oxygen  Post-op Assessment: Report given to RN and Post -op Vital signs reviewed and stable  Post vital signs: Reviewed and stable  Last Vitals:  Vitals:   06/28/16 0824 06/28/16 1117  BP: 118/64 96/65  Pulse: 79 81  Resp: 18 14  Temp: 36.6 C 36.8 C    Last Pain:  Vitals:   06/28/16 0824  TempSrc: Tympanic         Complications: No apparent anesthesia complications

## 2016-06-28 NOTE — Anesthesia Preprocedure Evaluation (Signed)
Anesthesia Evaluation  Patient identified by MRN, date of birth, ID band Patient awake    Reviewed: Allergy & Precautions, NPO status , Patient's Chart, lab work & pertinent test results, reviewed documented beta blocker date and time   Airway Mallampati: III  TM Distance: >3 FB     Dental  (+) Chipped   Pulmonary asthma , former smoker,           Cardiovascular hypertension, Pt. on medications and Pt. on home beta blockers + Peripheral Vascular Disease       Neuro/Psych    GI/Hepatic GERD  Controlled,  Endo/Other  diabetes, Type 2  Renal/GU      Musculoskeletal  (+) Arthritis ,   Abdominal   Peds  Hematology   Anesthesia Other Findings   Reproductive/Obstetrics                             Anesthesia Physical Anesthesia Plan  ASA: III  Anesthesia Plan: General   Post-op Pain Management:    Induction: Intravenous  PONV Risk Score and Plan:   Airway Management Planned: LMA  Additional Equipment:   Intra-op Plan:   Post-operative Plan:   Informed Consent: I have reviewed the patients History and Physical, chart, labs and discussed the procedure including the risks, benefits and alternatives for the proposed anesthesia with the patient or authorized representative who has indicated his/her understanding and acceptance.     Plan Discussed with: CRNA  Anesthesia Plan Comments:         Anesthesia Quick Evaluation

## 2016-06-28 NOTE — Anesthesia Postprocedure Evaluation (Signed)
Anesthesia Post Note  Patient: Gerald Clark  Procedure(s) Performed: Procedure(s) (LRB): HERNIA REPAIR INGUINAL ADULT WITH MESH (Left)  Patient location during evaluation: PACU Anesthesia Type: General Level of consciousness: awake and alert Pain management: pain level controlled Vital Signs Assessment: post-procedure vital signs reviewed and stable Respiratory status: spontaneous breathing, nonlabored ventilation, respiratory function stable and patient connected to nasal cannula oxygen Cardiovascular status: blood pressure returned to baseline and stable Postop Assessment: no signs of nausea or vomiting Anesthetic complications: no     Last Vitals:  Vitals:   06/28/16 1257 06/28/16 1331  BP: 136/63 (!) 142/72  Pulse: 73 81  Resp: 14 16  Temp: 36.3 C     Last Pain:  Vitals:   06/28/16 1342  TempSrc:   PainSc: 2                  Benigno,Uyen Eichholz S

## 2016-06-28 NOTE — Anesthesia Procedure Notes (Signed)
Procedure Name: Intubation Date/Time: 06/28/2016 9:39 AM Performed by: Hedda Slade Pre-anesthesia Checklist: Patient identified, Patient being monitored, Timeout performed, Emergency Drugs available and Suction available Patient Re-evaluated:Patient Re-evaluated prior to inductionOxygen Delivery Method: Circle system utilized Preoxygenation: Pre-oxygenation with 100% oxygen Intubation Type: IV induction Ventilation: Mask ventilation without difficulty and Oral airway inserted - appropriate to patient size Laryngoscope Size: McGraph and 4 Grade View: Grade I Tube type: Oral Tube size: 7.5 mm Number of attempts: 1 Airway Equipment and Method: Stylet Placement Confirmation: ETT inserted through vocal cords under direct vision,  positive ETCO2 and breath sounds checked- equal and bilateral Secured at: 21 cm Tube secured with: Tape Dental Injury: Teeth and Oropharynx as per pre-operative assessment

## 2016-06-28 NOTE — Discharge Instructions (Addendum)
Take Tylenol or Norco if needed for pain.  Should not drive or do anything dangerous when taking Norco.  May shower and blot dry.  Avoid straining and heavy lifting.  General Anesthesia, Adult, Care After These instructions provide you with information about caring for yourself after your procedure. Your health care provider may also give you more specific instructions. Your treatment has been planned according to current medical practices, but problems sometimes occur. Call your health care provider if you have any problems or questions after your procedure. What can I expect after the procedure? After the procedure, it is common to have:  Vomiting.  A sore throat.  Mental slowness.  It is common to feel:  Nauseous.  Cold or shivery.  Sleepy.  Tired.  Sore or achy, even in parts of your body where you did not have surgery.  Follow these instructions at home: For at least 24 hours after the procedure:  Do not: ? Participate in activities where you could fall or become injured. ? Drive. ? Use heavy machinery. ? Drink alcohol. ? Take sleeping pills or medicines that cause drowsiness. ? Make important decisions or sign legal documents. ? Take care of children on your own.  Rest. Eating and drinking  If you vomit, drink water, juice, or soup when you can drink without vomiting.  Drink enough fluid to keep your urine clear or pale yellow.  Make sure you have little or no nausea before eating solid foods.  Follow the diet recommended by your health care provider. General instructions  Have a responsible adult stay with you until you are awake and alert.  Return to your normal activities as told by your health care provider. Ask your health care provider what activities are safe for you.  Take over-the-counter and prescription medicines only as told by your health care provider.  If you smoke, do not smoke without supervision.  Keep all follow-up visits as told  by your health care provider. This is important. Contact a health care provider if:  You continue to have nausea or vomiting at home, and medicines are not helpful.  You cannot drink fluids or start eating again.  You cannot urinate after 8-12 hours.  You develop a skin rash.  You have fever.  You have increasing redness at the site of your procedure. Get help right away if:  You have difficulty breathing.  You have chest pain.  You have unexpected bleeding.  You feel that you are having a life-threatening or urgent problem. This information is not intended to replace advice given to you by your health care provider. Make sure you discuss any questions you have with your health care provider. Document Released: 04/18/2000 Document Revised: 06/15/2015 Document Reviewed: 12/25/2014 Elsevier Interactive Patient Education  2018 [place] Repair, Adult, Care After These instructions give you information about caring for yourself after your procedure. Your doctor may also give you more specific instructions. If you have problems or questions, contact your doctor. Follow these instructions at home: Surgical cut (incision) care   Follow instructions from your doctor about how to take care of your surgical cut area. Make sure you: ? Wash your hands with soap and water before you change your bandage (dressing). If you cannot use soap and water, use hand sanitizer. ? Change your bandage as told by your doctor. ? Leave stitches (sutures), skin glue, or skin tape (adhesive) strips in place. They may need to stay in place for 2  weeks or longer. If tape strips get loose and curl up, you may trim the loose edges. Do not remove tape strips completely unless your doctor says it is okay.  Check your surgical cut every day for signs of infection. Check for: ? More redness, swelling, or pain. ? More fluid or blood. ? Warmth. ? Pus or a bad smell. Activity  Do not drive or  use heavy machinery while taking prescription pain medicine. Do not drive until your doctor says it is okay.  Until your doctor says it is okay: ? Do not lift anything that is heavier than 10 lb (4.5 kg). ? Do not play contact sports.  Return to your normal activities as told by your doctor. Ask your doctor what activities are safe. General instructions  To prevent or treat having a hard time pooping (constipation) while you are taking prescription pain medicine, your doctor may recommend that you: ? Drink enough fluid to keep your pee (urine) clear or pale yellow. ? Take over-the-counter or prescription medicines. ? Eat foods that are high in fiber, such as fresh fruits and vegetables, whole grains, and beans. ? Limit foods that are high in fat and processed sugars, such as fried and sweet foods.  Take over-the-counter and prescription medicines only as told by your doctor.  Do not take baths, swim, or use a hot tub until your doctor says it is okay.  Keep all follow-up visits as told by your doctor. This is important. Contact a doctor if:  You develop a rash.  You have more redness, swelling, or pain around your surgical cut.  You have more fluid or blood coming from your surgical cut.  Your surgical cut feels warm to the touch.  You have pus or a bad smell coming from your surgical cut.  You have a fever or chills.  You have blood in your poop (stool).  You have not pooped in 2-3 days.  Medicine does not help your pain. Get help right away if:  You have chest pain or you are short of breath.  You feel light-headed.  You feel weak and dizzy (feel faint).  You have very bad pain.  You throw up (vomit) and your pain is worse. This information is not intended to replace advice given to you by your health care provider. Make sure you discuss any questions you have with your health care provider. Document Released: 01/31/2014 Document Revised: 07/31/2015 Document  Reviewed: 06/24/2015 Elsevier Interactive Patient Education  2017 [employer].

## 2016-06-28 NOTE — Op Note (Signed)
OPERATIVE REPORT  PREOPERATIVE DIAGNOSIS: left inguinal hernia  POSTOPERATIVE DIAGNOSIS:left  inguinal hernia  PROCEDURE:  left inguinal hernia repair  ANESTHESIA:  General  SURGEON:  Rochel Brome M.D.  INDICATIONS: He is a history of mild discomfort and bulging in the left groin. A left inguinal hernia was demonstrated on physical exam and repair was recommended for definitive treatment.    With the patient on the operating table in the supine position the left lower quadrant was prepared with clippers and with ChloraPrep and draped in a sterile manner. A transversely oriented suprapubic incision was made and carried down through subcutaneous tissues. Electrocautery was used for hemostasis. The Scarpa's fascia was incised. The external oblique aponeurosis was incised along the course of its fibers to open the external ring and expose the inguinal cord structures. The cord structures were mobilized. A Penrose drain was passed around the cord structures for traction. Cremaster fibers were separated to expose an indirect hernia sac. The sac was dissected free from surrounding structures. The sac was opened. Its continuity with the peritoneal cavity was demonstrated. The sac was approximately 10 cm in length. A high ligation of the sac was done with a 4-0 Vicryl suture ligature. The sac was excised and was not submitted for pathology. A small cord lipoma was suture ligated with 4-0 Vicryl and amputated and was not submitted for pathology. There was a large defect at the internal ring. The repair was carried out with 0 Surgilon sutures beginning at the pubic tubercle suturing the conjoined tendon to the shelving edge of the inguinal ligament. The last stitch led to satisfactory narrowing of the internal ring. Bard soft mesh was cut to create an oval shape and was placed over the repair. This was sutured to the repair with interrupted 0 Surgilon sutures and also sutured medially to the deep fascia and on  both sides of the internal ring. Next after seeing hemostasis was intact the cord structures were replaced along the floor of the inguinal canal. The cut edges of the external oblique aponeurosis were closed with a running 4-0 Vicryl suture to re-create the external ring. The deep fascia superior and lateral to the repair site was infiltrated with half percent Sensorcaine with epinephrine. Subcutaneous tissues were also infiltrated. The Scarpa's fascia was closed with interrupted 4-0 Vicryl sutures. The skin was closed with running 4-0 Monocryl subcuticular suture and Dermabond. The testicle remained in the scrotum  The patient appeared to be in satisfactory condition and was prepared for transfer to the recovery room.  Rochel Brome M.D.

## 2016-06-28 NOTE — H&P (Signed)
  He reports no change in overall condition since the day of the office exam. He comes in today prepared for left inguinal hernia repair.  The left side was marked YES.  Lab work reviewed  I discussed the plan for left inguinal hernia repair

## 2016-06-28 NOTE — Anesthesia Post-op Follow-up Note (Cosign Needed)
Anesthesia QCDR form completed.        

## 2016-08-25 ENCOUNTER — Other Ambulatory Visit: Payer: Self-pay | Admitting: Internal Medicine

## 2016-08-25 DIAGNOSIS — I739 Peripheral vascular disease, unspecified: Secondary | ICD-10-CM

## 2016-09-06 ENCOUNTER — Ambulatory Visit: Payer: Medicare Other

## 2016-09-12 ENCOUNTER — Ambulatory Visit
Admission: RE | Admit: 2016-09-12 | Discharge: 2016-09-12 | Disposition: A | Discharge status: Home / Self Care | Payer: Medicare Other | Source: Ambulatory Visit | Attending: Internal Medicine | Admitting: Internal Medicine

## 2016-09-12 DIAGNOSIS — I739 Peripheral vascular disease, unspecified: Secondary | ICD-10-CM | POA: Diagnosis present

## 2016-09-12 DIAGNOSIS — K5730 Diverticulosis of large intestine without perforation or abscess without bleeding: Secondary | ICD-10-CM | POA: Diagnosis not present

## 2016-09-12 DIAGNOSIS — K760 Fatty (change of) liver, not elsewhere classified: Secondary | ICD-10-CM | POA: Diagnosis not present

## 2016-09-12 DIAGNOSIS — I70209 Unspecified atherosclerosis of native arteries of extremities, unspecified extremity: Secondary | ICD-10-CM | POA: Diagnosis not present

## 2016-09-12 DIAGNOSIS — I701 Atherosclerosis of renal artery: Secondary | ICD-10-CM | POA: Diagnosis not present

## 2016-09-12 MED ORDER — IOPAMIDOL (ISOVUE-370) INJECTION 76%
125.0000 mL | Freq: Once | INTRAVENOUS | Status: AC | PRN
  Administered 2016-09-12: 125 mL via INTRAVENOUS

## 2016-10-07 ENCOUNTER — Encounter (INDEPENDENT_AMBULATORY_CARE_PROVIDER_SITE_OTHER): Payer: Self-pay | Admitting: Vascular Surgery

## 2016-10-07 ENCOUNTER — Ambulatory Visit (INDEPENDENT_AMBULATORY_CARE_PROVIDER_SITE_OTHER): Payer: Medicare Other | Admitting: Vascular Surgery

## 2016-10-07 ENCOUNTER — Telehealth (INDEPENDENT_AMBULATORY_CARE_PROVIDER_SITE_OTHER): Payer: Self-pay

## 2016-10-07 VITALS — BP 126/73 | HR 85 | Resp 17 | Ht 67.0 in | Wt 185.0 lb

## 2016-10-07 DIAGNOSIS — E785 Hyperlipidemia, unspecified: Secondary | ICD-10-CM | POA: Diagnosis not present

## 2016-10-07 DIAGNOSIS — I70213 Atherosclerosis of native arteries of extremities with intermittent claudication, bilateral legs: Secondary | ICD-10-CM | POA: Diagnosis not present

## 2016-10-07 DIAGNOSIS — I70219 Atherosclerosis of native arteries of extremities with intermittent claudication, unspecified extremity: Secondary | ICD-10-CM | POA: Insufficient documentation

## 2016-10-07 DIAGNOSIS — E119 Type 2 diabetes mellitus without complications: Secondary | ICD-10-CM | POA: Diagnosis not present

## 2016-10-07 DIAGNOSIS — I10 Essential (primary) hypertension: Secondary | ICD-10-CM | POA: Insufficient documentation

## 2016-10-07 NOTE — Assessment & Plan Note (Signed)
blood pressure control important in reducing the progression of atherosclerotic disease. On appropriate oral medications.  

## 2016-10-07 NOTE — Assessment & Plan Note (Signed)
lipid control important in reducing the progression of atherosclerotic disease. Continue statin therapy  

## 2016-10-07 NOTE — Patient Instructions (Signed)

## 2016-10-07 NOTE — Assessment & Plan Note (Signed)
blood glucose control important in reducing the progression of atherosclerotic disease. Also, involved in wound healing. On appropriate medications.  

## 2016-10-07 NOTE — Assessment & Plan Note (Signed)
He has undergone a CT angiogram which I have independently reviewed. This demonstrates reasonably diffuse peripheral arterial disease throughout both lower extremities. 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 There is common femoral and femoral bifurcation disease bilaterally that appears at least moderate. There appears to be a short segment occlusion or high-grade stenosis in both distal superficial femoral arteries. His tibial vessels seemed to be good bilaterally.  Recommend:  The patient has experienced increased symptoms and is now describing lifestyle limiting claudication.   Given the severity of the patient's lower extremity symptoms the patient should undergo angiography and intervention.  Risk and benefits were reviewed the patient.  Indications for the procedure were reviewed.  All questions were answered, the patient agrees to proceed.   The patient should continue walking and begin a more formal exercise program.  The patient should continue antiplatelet therapy and aggressive treatment of the lipid abnormalities  The patient will follow up with me after the angiogram.

## 2016-10-07 NOTE — Progress Notes (Signed)
Patient ID: Gerald Clark, male   DOB: 12/31/1945, 71 y.o.   MRN: [id number]  Chief Complaint  Patient presents with  . Claudication    HPI Gerald Clark is a 71 y.o. male.  I am asked to see the patient by Dr. Caryl Comes for evaluation of PAD.  The patient reports Worsening leg pain with activity over the past several years. There was no clear inciting event or causative factor and started the pain. The patient reports no ulceration or infection. Both lower extremities are affected. He says he can only now walk very short distances before having to stop and sit down. Several years ago, he did not notice the pain until he had walked for quite some time. He has multiple atherosclerotic risk factors as listed below. He has previous tobacco use. He denies chest pain or shortness of breath. No fever or chills. He has undergone a CT angiogram which I have independently reviewed. This demonstrates reasonably diffuse peripheral arterial disease throughout both lower extremities. 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 There is common femoral and femoral bifurcation disease bilaterally that appears at least moderate. There appears to be a short segment occlusion or high-grade stenosis in both distal superficial femoral arteries. His tibial vessels seemed to be good bilaterally.   Past Medical History:  Diagnosis Date  . Arthritis   . Asthma    childhood asthma  . Cancer ([place])    Basal Cell Skin Cancer  . Diabetes mellitus without complication ([place])   . GERD (gastroesophageal reflux disease)   . Glaucoma   . History of chicken pox   . Hyperlipemia   . Hypertension   . Peripheral vascular disease [facility])     Past Surgical History:  Procedure Laterality Date  . COLONOSCOPY N/A 06/02/2014   Procedure: COLONOSCOPY;  Surgeon: Josefine Class, MD;  Location: [facility] ENDOSCOPY;  Service: Endoscopy;  Laterality: N/A;  . INGUINAL HERNIA REPAIR Left  06/28/2016   Procedure: HERNIA REPAIR INGUINAL ADULT WITH MESH;  Surgeon: Leonie Green, MD;  Location: [facility] ORS;  Service: General;  Laterality: Left;    Family History  Problem Relation Age of Onset  . Hypertension Mother   . Hypertension Father   No bleeding disorders, clotting disorders, or aneurysms  Social History Social History  Substance Use Topics  . Smoking status: Former Smoker    Packs/day: 2.00    Types: Cigarettes    Quit date: 04/25/2011  . Smokeless tobacco: Never Used  . Alcohol use 3.0 oz/week    5 Cans of beer per week     Comment: occas  No IVDU  No Known Allergies  Current Outpatient Prescriptions  Medication Sig Dispense Refill  . aspirin 81 MG tablet Take 81 mg by mouth daily.    . brimonidine (ALPHAGAN) 0.2 % ophthalmic solution Place 1 drop into both eyes 2 (two) times daily.    . Cinnamon 500 MG capsule Take 500 mg by mouth daily.    . dorzolamide (TRUSOPT) 2 % ophthalmic solution Place 1 drop into both eyes 2 (two) times daily.    . empagliflozin (JARDIANCE) 10 MG TABS tablet Take 10 mg by mouth daily.    Marland Kitchen glipiZIDE (GLUCOTROL) 10 MG tablet Take 10 mg by mouth 2 (two) times daily before a meal.     . hydrochlorothiazide (HYDRODIURIL) 25 MG tablet Take 25 mg by mouth daily.    Marland Kitchen latanoprost (XALATAN) 0.005 % ophthalmic solution Place  1 drop into both eyes at bedtime.    Marland Kitchen lisinopril (PRINIVIL,ZESTRIL) 40 MG tablet Take 40 mg by mouth every evening.     . magnesium oxide (MAG-OX) 400 MG tablet Take 400 mg by mouth every evening.    . metFORMIN (GLUCOPHAGE) 1000 MG tablet Take 1,000 mg by mouth 2 (two) times daily with a meal.    . niacin 500 MG CR capsule Take 500 mg by mouth at bedtime.     . pioglitazone (ACTOS) 15 MG tablet TAKE 1 TABLET (15 MG TOTAL) BY MOUTH ONCE DAILY.  5  . Polyethyl Glycol-Propyl Glycol (SYSTANE ULTRA OP) Apply 1 drop to eye 3 (three) times daily as needed (Dry eyes).    . Potassium 99 MG TABS Take 99 mg by mouth daily.      . simvastatin (ZOCOR) 40 MG tablet Take 40 mg by mouth daily at 6 PM.     . timolol (TIMOPTIC) 0.5 % ophthalmic solution Place 1 drop into both eyes 2 (two) times daily.    Marland Kitchen amLODipine (NORVASC) 5 MG tablet Take 5 mg by mouth daily.    . Chromium Picolinate 500 MCG CAPS Take 500 mcg by mouth daily.    Marland Kitchen HYDROcodone-acetaminophen (NORCO) 5-325 MG tablet Take 1-2 tablets by mouth every 4 (four) hours as needed for moderate pain. (Patient not taking: Reported on 10/07/2016) 16 tablet 0   No current facility-administered medications for this visit.       REVIEW OF SYSTEMS (Negative unless checked)  Constitutional: [] Weight loss  [] Fever  [] Chills Cardiac: [] Chest pain   [] Chest pressure   [] Palpitations   [] Shortness of breath when laying flat   [] Shortness of breath at rest   [x] Shortness of breath with exertion. Vascular:  [] Pain in legs with walking   [] Pain in legs at rest   [] Pain in legs when laying flat   [x] Claudication   [] Pain in feet when walking  [] Pain in feet at rest  [] Pain in feet when laying flat   [] History of DVT   [] Phlebitis   [] Swelling in legs   [] Varicose veins   [] Non-healing ulcers Pulmonary:   [] Uses home oxygen   [] Productive cough   [] Hemoptysis   [] Wheeze  [] COPD   [x] Asthma Neurologic:  [] Dizziness  [] Blackouts   [] Seizures   [] History of stroke   [] History of TIA  [] Aphasia   [] Temporary blindness   [] Dysphagia   [] Weakness or numbness in arms   [] Weakness or numbness in legs Musculoskeletal:  [x] Arthritis   [] Joint swelling   [] Joint pain   [] Low back pain Hematologic:  [] Easy bruising  [] Easy bleeding   [] Hypercoagulable state   [] Anemic  [] Hepatitis Gastrointestinal:  [] Blood in stool   [] Vomiting blood  [x] Gastroesophageal reflux/heartburn   [] Abdominal pain Genitourinary:  [] Chronic kidney disease   [] Difficult urination  [] Frequent urination  [] Burning with urination   [] Hematuria Skin:  [] Rashes   [] Ulcers   [] Wounds Psychological:  [] History of anxiety    []  History of major depression.    Physical Exam BP 126/73   Pulse 85   Resp 17   Ht 5\' 7"  (1.702 m)   Wt 83.9 kg (185 lb)   BMI 28.98 kg/m  Gen:  WD/WN, NAD Head: /AT, No temporalis wasting. Ear/Nose/Throat: Hearing grossly intact, nares w/o erythema or drainage, oropharynx w/o Erythema/Exudate Eyes: Conjunctiva clear, sclera non-icteric  Neck: trachea midline.  No JVD.  Pulmonary:  Good air movement, respirations not labored, no use of accessory muscles Cardiac: RRR,  normal S1, S2 Vascular:  Vessel Right Left  Radial Palpable Palpable                  Femoral 1+ Palpable 1+ Palpable  Popliteal Not Palpable Not Palpable  PT 1+ Palpable Trace Palpable  DP Not Palpable 1+ Palpable   Gastrointestinal: soft, non-tender/non-distended. Musculoskeletal: M/S 5/5 throughout. No deformity or atrophy. No significant lower extremity edema. Neurologic: Sensation grossly intact in extremities.  Symmetrical.  Speech is fluent. Motor exam as listed above. Psychiatric: Judgment intact, Mood & affect appropriate for pt's clinical situation. Dermatologic: No rashes or ulcers noted.  No cellulitis or open wounds. Lymph : No Cervical, Axillary, or Inguinal lymphadenopathy.   Radiology Ct Angio Ao+bifem W & Or Wo Contrast  Result Date: 09/12/2016 CLINICAL DATA:  71 year old with peripheral vascular disease. Cramping in both femurs for 3-4 years. EXAM: CT ANGIOGRAPHY OF ABDOMINAL AORTA WITH ILIOFEMORAL RUNOFF TECHNIQUE: Multidetector CT imaging of the abdomen, pelvis and lower extremities was performed using the standard protocol during bolus administration of intravenous contrast. Multiplanar CT image reconstructions and MIPs were obtained to evaluate the vascular anatomy. CONTRAST:  125 mL Isovue 370 COMPARISON:  None. FINDINGS: VASCULAR Aorta: Distal descending thoracic aorta measures 2.8 cm. The abdominal aorta has diffuse peripheral calcifications but no aneurysm. Negative for an aortic  dissection. Celiac: Calcified plaque at the origin of the celiac trunk with at least moderate stenosis. Main branches of the celiac trunk are patent. SMA: Patent without evidence of aneurysm, dissection, vasculitis or significant stenosis. Renals: Right renal artery is patent without significant atherosclerotic disease or stenosis. Calcified plaque at the origin the left renal artery causing mild to moderate narrowing. No evidence for a dissection or aneurysm. IMA: Patent without evidence of aneurysm, dissection, vasculitis or significant stenosis. RIGHT Lower Extremity Inflow: Iliac arteries are diffusely calcified. There is a large focal plaque in the proximal right common iliac artery on sequence 4, image 100. This calcified plaque may be causing focal hemodynamically significant stenosis. There is also focal plaque at the origin of the right external iliac artery which may be hemodynamically significant. This plaque is seen on sequence 4, image 106. Right internal iliac artery is patent. Narrowing due to calcified plaque in the mid right external iliac artery. Outflow: Calcified plaque and stenosis in the right common femoral artery near the takeoff of the profunda femoral artery. This is likely hemodynamically significant. The profunda femoral artery is patent. Calcified plaque in the right SFA without significant stenosis in the proximal and mid segments. However, there is large calcified plaque in the right adductor canal which may be hemodynamically significant. Right popliteal artery is patent with minimal atherosclerotic disease. Runoff: Normal three-vessel runoff in the right lower extremity. Posterior tibial artery and dorsalis pedis artery are patent at the ankle. LEFT Lower Extremity Inflow: The left iliac arteries are heavily calcified. There is a large amount of calcified plaque in the proximal left common iliac artery on sequence 4, image 99 likely causing moderate to severe stenosis. There is also  a large amount of calcified plaque and stenosis at the origin of the left external iliac artery. Scattered areas of stenosis in left external iliac artery related to calcified plaque. Left internal iliac artery appears to be diffusely diseased. Outflow: Large amount of calcified plaque in left common femoral artery with extensive positive remodeling but probably moderate stenosis. Left profunda femoral artery is patent. Calcified plaque throughout the left SFA with the large calcified plaque in the distal aspect  near the adductor canal. This distribution of disease is similar to the right leg. Probably hemodynamically significant stenosis near the adductor canal. Mild atherosclerotic disease in the left popliteal artery without significant stenosis. Runoff: Three-vessel runoff disease in the left lower extremity. The posterior tibial artery and dorsalis pedal artery are patent at the ankle. Veins: No obvious venous abnormality within the limitations of this arterial phase study. Review of the MIP images confirms the above findings. NON-VASCULAR Lower chest: Lung bases are clear.  No large pleural effusions. Hepatobiliary: Liver has diffusely decreased attenuation compatible with steatosis. No suspicious hepatic findings. Normal appearance of the gallbladder. Pancreas: Normal appearance of the pancreas without inflammation or duct dilatation. Spleen: Normal appearance of spleen without enlargement. Adrenals/Urinary Tract: Normal appearance of the adrenal glands. Normal appearance of both kidneys without hydronephrosis. No suspicious renal lesions. Urinary bladder is unremarkable. Stomach/Bowel: Diverticulosis in the sigmoid colon. There is no focal bowel inflammation or wall thickening. No evidence for bowel obstruction. Normal appendix. Lymphatic: No significant lymph node enlargement in the abdomen or pelvis. Few small lymph nodes near the porta hepatis region and around the distal esophagus which are nonspecific.  Reproductive: Prostate is mildly prominent measuring up to 4.8 cm in transverse dimension. Other: Subcutaneous edema and stranding in the left inguinal region probably related to prior surgery or intervention. Evidence for small fat containing inguinal hernias. Negative for ascites. No free air. Musculoskeletal: No acute abnormality. IMPRESSION: VASCULAR Diffuse atherosclerotic disease causing bilateral inflow and outflow disease. Diffuse inflow disease involving the common and external iliac arteries bilaterally. Stenosis in the common femoral artery and distal SFA bilaterally. No significant runoff disease. 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 Left renal artery stenosis as described. NON-VASCULAR Hepatic steatosis. Colonic diverticulosis.  No acute inflammatory changes. Electronically Signed   By: Markus Daft M.D.   On: 09/12/2016 09:29    Labs No results found for this or any previous visit (from the past 2160 hour(s)).  Assessment/Plan:  Hypertension blood pressure control important in reducing the progression of atherosclerotic disease. On appropriate oral medications.   Diabetes mellitus without complication (HCC) blood glucose control important in reducing the progression of atherosclerotic disease. Also, involved in wound healing. On appropriate medications.   Hyperlipemia lipid control important in reducing the progression of atherosclerotic disease. Continue statin therapy   Atherosclerosis of native arteries of extremity with intermittent claudication ([place]) He has undergone a CT angiogram which I have independently reviewed. This demonstrates reasonably diffuse peripheral arterial disease throughout both lower extremities. 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 There is common femoral and femoral bifurcation disease bilaterally that appears at least moderate. There appears to be a short segment occlusion or high-grade stenosis in both  distal superficial femoral arteries. His tibial vessels seemed to be good bilaterally.  Recommend:  The patient has experienced increased symptoms and is now describing lifestyle limiting claudication.   Given the severity of the patient's lower extremity symptoms the patient should undergo angiography and intervention.  Risk and benefits were reviewed the patient.  Indications for the procedure were reviewed.  All questions were answered, the patient agrees to proceed.   The patient should continue walking and begin a more formal exercise program.  The patient should continue antiplatelet therapy and aggressive treatment of the lipid abnormalities  The patient will follow up with me after the angiogram.       Leotis Pain 10/07/2016, 11:21 AM   This note was created with [employer]  medical transcription system.  Any errors from dictation are unintentional.

## 2016-10-07 NOTE — Telephone Encounter (Signed)
Attempted to contact the patient, but the voicemail is not set up so unable to make contact or leave a message.

## 2016-10-12 ENCOUNTER — Other Ambulatory Visit (INDEPENDENT_AMBULATORY_CARE_PROVIDER_SITE_OTHER): Payer: Self-pay | Admitting: Vascular Surgery

## 2016-10-12 ENCOUNTER — Encounter (INDEPENDENT_AMBULATORY_CARE_PROVIDER_SITE_OTHER): Payer: Self-pay

## 2016-10-21 ENCOUNTER — Encounter
Admission: RE | Admit: 2016-10-21 | Discharge: 2016-10-21 | Disposition: A | Discharge status: Home / Self Care | Payer: Medicare Other | Source: Ambulatory Visit | Attending: Vascular Surgery | Admitting: Vascular Surgery

## 2016-10-21 DIAGNOSIS — M1990 Unspecified osteoarthritis, unspecified site: Secondary | ICD-10-CM | POA: Diagnosis not present

## 2016-10-21 DIAGNOSIS — E785 Hyperlipidemia, unspecified: Secondary | ICD-10-CM | POA: Diagnosis not present

## 2016-10-21 DIAGNOSIS — I10 Essential (primary) hypertension: Secondary | ICD-10-CM | POA: Diagnosis not present

## 2016-10-21 DIAGNOSIS — Z87891 Personal history of nicotine dependence: Secondary | ICD-10-CM | POA: Diagnosis not present

## 2016-10-21 DIAGNOSIS — Z8619 Personal history of other infectious and parasitic diseases: Secondary | ICD-10-CM | POA: Diagnosis not present

## 2016-10-21 DIAGNOSIS — K219 Gastro-esophageal reflux disease without esophagitis: Secondary | ICD-10-CM | POA: Diagnosis not present

## 2016-10-21 DIAGNOSIS — H409 Unspecified glaucoma: Secondary | ICD-10-CM | POA: Diagnosis not present

## 2016-10-21 DIAGNOSIS — Z7982 Long term (current) use of aspirin: Secondary | ICD-10-CM | POA: Diagnosis not present

## 2016-10-21 DIAGNOSIS — Z8249 Family history of ischemic heart disease and other diseases of the circulatory system: Secondary | ICD-10-CM | POA: Diagnosis not present

## 2016-10-21 DIAGNOSIS — I70213 Atherosclerosis of native arteries of extremities with intermittent claudication, bilateral legs: Secondary | ICD-10-CM | POA: Diagnosis present

## 2016-10-21 DIAGNOSIS — Z9889 Other specified postprocedural states: Secondary | ICD-10-CM | POA: Diagnosis not present

## 2016-10-21 DIAGNOSIS — E119 Type 2 diabetes mellitus without complications: Secondary | ICD-10-CM | POA: Diagnosis not present

## 2016-10-21 DIAGNOSIS — Z7984 Long term (current) use of oral hypoglycemic drugs: Secondary | ICD-10-CM | POA: Diagnosis not present

## 2016-10-21 LAB — BUN: BUN: 19 mg/dL (ref 6–20)

## 2016-10-21 LAB — CREATININE, SERUM
CREATININE: 1.14 mg/dL (ref 0.61–1.24)
GFR calc Af Amer: >60 mL/min (ref >60)
GFR calc non Af Amer: >60 mL/min (ref >60)

## 2016-10-21 NOTE — Patient Instructions (Signed)
[facility]  Your procedure is scheduled with Dr. Lucky Cowboy                           On:  Date:  Monday, October 24, 2016                   FOLLOW INSTRUCTIONS GIVEN BY DR. Bunnie Domino OFFICE.  On arrival go [facility] on first floor of the [facility].  Please call Dr Lucky Cowboy and Dr Nino Parsley office with any questions or concerns: [phone].  You will need to have someone drive you home and stay with you the night of the procedure.

## 2016-10-23 MED ORDER — CEFAZOLIN SODIUM-DEXTROSE 2-4 GM/100ML-% IV SOLN
2.0000 g | Freq: Once | INTRAVENOUS | Status: AC
  Administered 2016-10-24: 2 g via INTRAVENOUS

## 2016-10-24 ENCOUNTER — Ambulatory Visit
Admission: RE | Admit: 2016-10-24 | Discharge: 2016-10-24 | Disposition: A | Discharge status: Home / Self Care | Payer: Medicare Other | Source: Ambulatory Visit | Attending: Vascular Surgery | Admitting: Vascular Surgery

## 2016-10-24 ENCOUNTER — Encounter: Payer: Self-pay | Admitting: Emergency Medicine

## 2016-10-24 ENCOUNTER — Encounter
Admission: RE | Disposition: A | Discharge status: Home / Self Care | Payer: Self-pay | Source: Ambulatory Visit | Attending: Vascular Surgery

## 2016-10-24 DIAGNOSIS — I70213 Atherosclerosis of native arteries of extremities with intermittent claudication, bilateral legs: Secondary | ICD-10-CM | POA: Insufficient documentation

## 2016-10-24 DIAGNOSIS — E119 Type 2 diabetes mellitus without complications: Secondary | ICD-10-CM | POA: Insufficient documentation

## 2016-10-24 DIAGNOSIS — Z9889 Other specified postprocedural states: Secondary | ICD-10-CM | POA: Insufficient documentation

## 2016-10-24 DIAGNOSIS — Z8249 Family history of ischemic heart disease and other diseases of the circulatory system: Secondary | ICD-10-CM | POA: Insufficient documentation

## 2016-10-24 DIAGNOSIS — K219 Gastro-esophageal reflux disease without esophagitis: Secondary | ICD-10-CM | POA: Insufficient documentation

## 2016-10-24 DIAGNOSIS — I10 Essential (primary) hypertension: Secondary | ICD-10-CM | POA: Insufficient documentation

## 2016-10-24 DIAGNOSIS — H409 Unspecified glaucoma: Secondary | ICD-10-CM | POA: Insufficient documentation

## 2016-10-24 DIAGNOSIS — Z87891 Personal history of nicotine dependence: Secondary | ICD-10-CM | POA: Insufficient documentation

## 2016-10-24 DIAGNOSIS — E785 Hyperlipidemia, unspecified: Secondary | ICD-10-CM | POA: Insufficient documentation

## 2016-10-24 DIAGNOSIS — M1990 Unspecified osteoarthritis, unspecified site: Secondary | ICD-10-CM | POA: Insufficient documentation

## 2016-10-24 DIAGNOSIS — Z7982 Long term (current) use of aspirin: Secondary | ICD-10-CM | POA: Insufficient documentation

## 2016-10-24 DIAGNOSIS — Z8619 Personal history of other infectious and parasitic diseases: Secondary | ICD-10-CM | POA: Insufficient documentation

## 2016-10-24 DIAGNOSIS — Z7984 Long term (current) use of oral hypoglycemic drugs: Secondary | ICD-10-CM | POA: Insufficient documentation

## 2016-10-24 HISTORY — PX: LOWER EXTREMITY ANGIOGRAPHY: CATH118251

## 2016-10-24 LAB — GLUCOSE, CAPILLARY
GLUCOSE-CAPILLARY: 159 mg/dL — AB (ref 65–99)
Glucose-Capillary: 189 mg/dL — ABNORMAL HIGH (ref 65–99)

## 2016-10-24 SURGERY — LOWER EXTREMITY ANGIOGRAPHY
Anesthesia: Moderate Sedation | Laterality: Right

## 2016-10-24 MED ORDER — CLOPIDOGREL BISULFATE 75 MG PO TABS: 75.0000 mg | ORAL_TABLET | Freq: Every day | ORAL | 11 refills | Status: DC

## 2016-10-24 MED ORDER — MIDAZOLAM HCL 2 MG/2ML IJ SOLN
INTRAMUSCULAR | Status: DC | PRN
  Administered 2016-10-24: 1 mg via INTRAVENOUS
  Administered 2016-10-24: 2 mg via INTRAVENOUS
  Administered 2016-10-24: 0.5 mg via INTRAVENOUS

## 2016-10-24 MED ORDER — HEPARIN SODIUM (PORCINE) 1000 UNIT/ML IJ SOLN
INTRAMUSCULAR | Status: AC
  Filled 2016-10-24: qty 1

## 2016-10-24 MED ORDER — SODIUM CHLORIDE 0.9% FLUSH: 3.0000 mL | Freq: Two times a day (BID) | INTRAVENOUS | Status: DC

## 2016-10-24 MED ORDER — FENTANYL CITRATE (PF) 100 MCG/2ML IJ SOLN
INTRAMUSCULAR | Status: AC
  Filled 2016-10-24: qty 2

## 2016-10-24 MED ORDER — FAMOTIDINE 20 MG PO TABS
40.0000 mg | ORAL_TABLET | ORAL | Status: DC | PRN
Start: 2016-10-24 — End: 2016-10-24

## 2016-10-24 MED ORDER — FENTANYL CITRATE (PF) 100 MCG/2ML IJ SOLN
INTRAMUSCULAR | Status: DC | PRN
  Administered 2016-10-24: 25 ug via INTRAVENOUS
  Administered 2016-10-24: 50 ug via INTRAVENOUS
  Administered 2016-10-24: 12.5 ug via INTRAVENOUS

## 2016-10-24 MED ORDER — SODIUM CHLORIDE 0.9 % IV SOLN: INTRAVENOUS | Status: DC

## 2016-10-24 MED ORDER — ONDANSETRON HCL 4 MG/2ML IJ SOLN: 4.0000 mg | Freq: Four times a day (QID) | INTRAMUSCULAR | Status: DC | PRN

## 2016-10-24 MED ORDER — HYDRALAZINE HCL 20 MG/ML IJ SOLN: 5.0000 mg | INTRAMUSCULAR | Status: DC | PRN

## 2016-10-24 MED ORDER — METHYLPREDNISOLONE SODIUM SUCC 125 MG IJ SOLR: 125.0000 mg | INTRAMUSCULAR | Status: DC | PRN

## 2016-10-24 MED ORDER — HYDROMORPHONE HCL 1 MG/ML IJ SOLN: 1.0000 mg | Freq: Once | INTRAMUSCULAR | Status: DC | PRN

## 2016-10-24 MED ORDER — HEPARIN SODIUM (PORCINE) 1000 UNIT/ML IJ SOLN
INTRAMUSCULAR | Status: DC | PRN
  Administered 2016-10-24: 3000 [IU] via INTRAVENOUS
  Administered 2016-10-24: 2000 [IU] via INTRAVENOUS

## 2016-10-24 MED ORDER — IOPAMIDOL (ISOVUE-300) INJECTION 61%
INTRAVENOUS | Status: DC | PRN
  Administered 2016-10-24: 90 mL via INTRAVENOUS

## 2016-10-24 MED ORDER — SODIUM CHLORIDE 0.9% FLUSH: 3.0000 mL | INTRAVENOUS | Status: DC | PRN

## 2016-10-24 MED ORDER — CLOPIDOGREL BISULFATE 75 MG PO TABS: 75.0000 mg | ORAL_TABLET | Freq: Every day | ORAL | Status: DC

## 2016-10-24 MED ORDER — LIDOCAINE-EPINEPHRINE (PF) 2 %-1:200000 IJ SOLN
INTRAMUSCULAR | Status: AC
  Filled 2016-10-24: qty 20

## 2016-10-24 MED ORDER — SODIUM CHLORIDE 0.9 % IV SOLN: 250.0000 mL | INTRAVENOUS | Status: DC | PRN

## 2016-10-24 MED ORDER — MIDAZOLAM HCL 5 MG/5ML IJ SOLN
INTRAMUSCULAR | Status: AC
  Filled 2016-10-24: qty 5

## 2016-10-24 MED ORDER — SODIUM CHLORIDE 0.9 % IV SOLN
INTRAVENOUS | Status: DC
  Administered 2016-10-24: 08:00:00 via INTRAVENOUS

## 2016-10-24 MED ORDER — LABETALOL HCL 5 MG/ML IV SOLN: 10.0000 mg | INTRAVENOUS | Status: DC | PRN

## 2016-10-24 MED ORDER — HEPARIN (PORCINE) IN NACL 2-0.9 UNIT/ML-% IJ SOLN
INTRAMUSCULAR | Status: AC
  Filled 2016-10-24: qty 1000

## 2016-10-24 SURGICAL SUPPLY — 25 items
BALLN LUTONIX DCB 5X100X130 (BALLOONS) ×2
BALLN LUTONIX DCB 6X100X130 (BALLOONS) ×2
BALLN LUTONIX DCB 6X80X130 (BALLOONS) ×2
BALLN LUTONIX DCB 7X60X130 (BALLOONS) ×2
BALLN ULTRVRSE 5X80X130C (BALLOONS) ×2
BALLN ULTRVRSE 7X80X75 (BALLOONS) ×2
BALLOON LUTONIX DCB 5X100X130 (BALLOONS) ×1 IMPLANT
BALLOON LUTONIX DCB 6X100X130 (BALLOONS) ×1 IMPLANT
BALLOON LUTONIX DCB 6X80X130 (BALLOONS) ×1 IMPLANT
BALLOON LUTONIX DCB 7X60X130 (BALLOONS) ×1 IMPLANT
BALLOON ULTRVRSE 5X80X130C (BALLOONS) ×1 IMPLANT
BALLOON ULTRVRSE 7X80X75 (BALLOONS) ×1 IMPLANT
CATH BEACON 5 .035 65 KMP TIP (CATHETERS) ×2 IMPLANT
CATH BEACON 5 .038 100 VERT TP (CATHETERS) ×2 IMPLANT
CATH PIG 70CM (CATHETERS) ×2 IMPLANT
DEVICE PRESTO INFLATION (MISCELLANEOUS) ×2 IMPLANT
DEVICE STARCLOSE SE CLOSURE (Vascular Products) ×2 IMPLANT
GLIDEWIRE ADV .035X260CM (WIRE) ×2 IMPLANT
PACK ANGIOGRAPHY (CUSTOM PROCEDURE TRAY) ×2 IMPLANT
SHEATH ANL2 6FRX45 HC (SHEATH) ×2 IMPLANT
SHEATH BRITE TIP 5FRX11 (SHEATH) ×2 IMPLANT
STENT LIFESTAR 7X80X80 (Permanent Stent) ×2 IMPLANT
STENT LIFESTENT 5F 6X80X135 (Permanent Stent) ×2 IMPLANT
TUBING CONTRAST HIGH PRESS 72 (TUBING) ×2 IMPLANT
WIRE J 3MM .035X145CM (WIRE) ×2 IMPLANT

## 2016-10-24 NOTE — H&P (Signed)
[facility] History & Physical Update  The patient was interviewed and re-examined.  The patient's previous History and Physical has been reviewed and is unchanged.  There is no change in the plan of care. We plan to proceed with the scheduled procedure.  Leotis Pain, MD  10/24/2016, 8:30 AM

## 2016-10-24 NOTE — Op Note (Signed)
[facility] Percutaneous Study/Intervention Procedural Note   Date of Surgery: 10/24/2016  Surgeon(s):Philisha Weinel   Assistants:none  Pre-operative Diagnosis: PAD with claudication bilateral lower extremities  Post-operative diagnosis: Same  Procedure(s) Performed: 1. Ultrasound guidance for vascular access left femoral artery 2. Catheter placement into right popliteal artery from left femoral approach 3. Aortogram and selective right lower extremity angiogram 4. Percutaneous transluminal angioplasty of left external iliac artery with 6 mm diameter by 8 cm length Lutonix drug-coated angioplasty balloon 5. percutaneous transluminal angioplasty of the left common iliac artery with 7 mm diameter by 6 cm length Lutonix drug-coated angioplasty balloon  6.  percutaneous transluminal angioplasty of the right SFA with 5 mm diameter by 10 cm length Lutonix drug-coated angioplasty balloon 7. percutaneous transluminal angioplasty of the right external iliac artery with 6 mm diameter by 10 cm length Lutonix drug-coated angioplasty balloon  8.  self extending stent placement to the right SFA for greater than 50% stenosis after angioplasty with 6 mm diameter by 8 cm length life stent  9.  self-expanding stent placement to the left external iliac artery with 7 mm diameter by 8 cm length life Star stent for greater than 50% residual stenosis after angioplasty  10. StarClose closure device left femoral artery  EBL: 10 cc  Contrast: 90 cc  Fluoro Time: 9.1 minutes  Moderate Conscious Sedation Time: approximately 50 minutes using 3.5 mg of Versed and 87.5 mcg of Fentanyl  Indications: Patient is a 71 y.o.male with severe, lifestyle limiting claudication of both lower extremities. The patient has a CT scan showing severe aortoiliac disease and bilateral common femoral and SFA disease. The  patient is brought in for angiography for further evaluation and potential treatment. Risks and benefits are discussed and informed consent is obtained  Procedure: The patient was identified and appropriate procedural time out was performed. The patient was then placed supine on the table and prepped and draped in the usual sterile fashion.Moderate conscious sedation was administered during a face to face encounter with the patient throughout the procedure with my supervision of the RN administering medicines and monitoring the patient's vital signs, pulse oximetry, telemetry and mental status throughout from the start of the procedure until the patient was taken to the recovery room. Ultrasound was used to evaluate the left common femoral artery. It was patent but heavily diseased. A digital ultrasound image was acquired. A Seldinger needle was used to access the left common femoral artery under direct ultrasound guidance and a permanent image was performed. A 0.035 J wire was advanced without resistance and a 5Fr sheath was placed. Pigtail catheter was placed into the aorta and an AP aortogram was performed. This demonstrated that the right renal artery appeared to be normal, the left renal artery appeared to have a moderate degree of stenosis. The aorta was highly calcific but not stenotic. There was severe iliac disease with what appeared to be reasonably normal right common iliac artery but a diffusely diseased right external iliac artery with significant stenosis of greater than 80% . There was then a highly calcific lesion in the right common femoral artery that appeared to create a 70% or greater stenosis. The left common iliac artery had about an 80% stenosis in the vessel normalized. The left external iliac artery had a short segment occlusion with another area of stenosis of greater than 70% a few centimeters beyond the occlusion. There was again a heavily calcified and at least moderately  diseased common femoral artery with  stenosis that appeared to be in the 60-70% range. It should be noted that a Kumpe catheter and an advantage wire were required to cross the lesions in the left external and common iliac arteries which were separate and distinct and confirm intraluminal flow in the aorta before the aortograms and pelvic obliques were performed I then crossed the aortic bifurcation and advanced to the right femoral head. Selective right lower extremity angiogram was then performed and it turned out this was actually in the proximal superficial femoral artery after we saw the high bifurcation of the profunda femoris artery. This demonstrated the above-mentioned right common femoral artery lesion. The profunda femoris artery had a somewhat high origin and was very medial. This had calcific lesion at its origin as well that appeared to be greater than 70%. After the origin of the SFA which did have a calcific stenosis, the vessel normalized until the distal SFA where there was a short segment occlusion. This reconstituted the above-knee popliteal artery and then had two-vessel runoff distally. The patient was systemically heparinized and I started by performing angioplasty of the left external and common iliac artery separately. The left external iliac artery was treated with a 6 mm diameter by 8 cm length Lutonix drug-coated angioplasty balloon inflated to 12 atm for 1 minute. The left common iliac artery was then treated with a 7 mm diameter by 6 cm length Lutonix drug-coated angioplasty balloon inflated to 10 atm for 1 minute. Completion angiogram following this showed only about a 20-30% residual stenosis in the left common iliac artery, but high-grade residual stenosis greater than 80% in the left external iliac artery. I would place a stent there would do this after treating the right leg and so at this point I went ahead and placed a 6 [place] [employer] sheath was then placed over the [employer] wire. I then used a Kumpe catheter and the advantage wire to navigate through the common femoral lesion and the SFA occlusion confirming intraluminal flow in the below-knee popliteal artery. The wire was then replaced. I then proceeded with treatment. The SFA lesion was treated with a 5 mm diameter by 10 cm length Lutonix drug-coated angioplasty balloon. This was inflated to 12 atm for 1 minute. Completion angiogram showed high-grade residual stenosis so a 6 mm diameter by 10 cm length life stent was then deployed and postdilated with a 5 mm balloon with less than 20% residual stenosis although calcific disease remained present. The right external iliac artery was then treated with a 6 mm diameter by 10 cm length Lutonix drug-coated angioplasty balloon inflated to 12 atm for 1 minute. Completion angiogram showed about a 30% residual stenosis in the right external iliac artery which was not flow limiting. The sheath was then pulled back to the ipsilateral external iliac artery on the left and a 7 mm diameter by 8 cm length life Star stent was deployed in the external iliac artery. This was postdilated with a 7 mm balloon with excellent angiographic completion result and less than 20% residual stenosis. I elected to terminate the procedure. The sheath was removed and StarClose closure device was deployed in the left femoral artery with excellent hemostatic result. The patient was taken to the recovery room in stable condition having tolerated the procedure well.  Findings:  Aortogram: right renal artery appeared to be normal, the left renal artery appeared to have a moderate degree of stenosis. The aorta was highly calcific but not stenotic. There was severe iliac disease  with what appeared to be reasonably normal right common iliac artery but a diffusely diseased right external iliac artery with significant stenosis of greater than 80% . There was then a highly calcific lesion in the right  common femoral artery that appeared to create a 70% or greater stenosis. The left common iliac artery had about an 80% stenosis in the vessel normalized. The left external iliac artery had a short segment occlusion with another area of stenosis of greater than 70% a few centimeters beyond the occlusion. There was again a heavily calcified and at least moderately diseased common femoral artery with stenosis that appeared to be in the 60-70% range. Right Lower Extremity: This demonstrated the above-mentioned right common femoral artery lesion. The profunda femoris artery had a somewhat high origin and was very medial. This had calcific lesion at its origin as well that appeared to be greater than 70%. After the origin of the SFA which did have a calcific stenosis, the vessel normalized until the distal SFA where there was a short segment occlusion. This reconstituted the above-knee popliteal artery and then had two-vessel runoff distally.   Disposition: Patient was taken to the recovery room in stable condition having tolerated the procedure well.  Complications: None  Leotis Pain 10/24/2016 10:25 AM   This note was created with Dragon Medical transcription system. Any errors in dictation are purely unintentional.

## 2016-10-24 NOTE — Discharge Instructions (Signed)
Angiogram, Care After °This sheet gives you information about how to care for yourself after your procedure. Your health care provider may also give you more specific instructions. If you have problems or questions, contact your health care provider. °What can I expect after the procedure? °After the procedure, it is common to have bruising and tenderness at the catheter insertion area. °Follow these instructions at home: °Insertion site care °· Follow instructions from your health care provider about how to take care of your insertion site. Make sure you: °? Wash your hands with soap and water before you change your bandage (dressing). If soap and water are not available, use hand sanitizer. °? Change your dressing as told by your health care provider. °? Leave stitches (sutures), skin glue, or adhesive strips in place. These skin closures may need to stay in place for 2 weeks or longer. If adhesive strip edges start to loosen and curl up, you may trim the loose edges. Do not remove adhesive strips completely unless your health care provider tells you to do that. °· Do not take baths, swim, or use a hot tub until your health care provider approves. °· You may shower 24-48 hours after the procedure or as told by your health care provider. °? Gently wash the site with plain soap and water. °? Pat the area dry with a clean towel. °? Do not rub the site. This may cause bleeding. °· Do not apply powder or lotion to the site. Keep the site clean and dry. °· Check your insertion site every day for signs of infection. Check for: °? Redness, swelling, or pain. °? Fluid or blood. °? Warmth. °? Pus or a bad smell. °Activity °· Rest as told by your health care provider, usually for 1-2 days. °· Do not lift anything that is heavier than 10 lbs. (4.5 kg) or as told by your health care provider. °· Do not drive for 24 hours if you were given a medicine to help you relax (sedative). °· Do not drive or use heavy machinery while  taking prescription pain medicine. °General instructions °· Return to your normal activities as told by your health care provider, usually in about a week. Ask your health care provider what activities are safe for you. °· If the catheter site starts bleeding, lie flat and put pressure on the site. If the bleeding does not stop, get help right away. This is a medical emergency. °· Drink enough fluid to keep your urine clear or pale yellow. This helps flush the contrast dye from your body. °· Take over-the-counter and prescription medicines only as told by your health care provider. °· Keep all follow-up visits as told by your health care provider. This is important. °Contact a health care provider if: °· You have a fever or chills. °· You have redness, swelling, or pain around your insertion site. °· You have fluid or blood coming from your insertion site. °· The insertion site feels warm to the touch. °· You have pus or a bad smell coming from your insertion site. °· You have bruising around the insertion site. °· You notice blood collecting in the tissue around the catheter site (hematoma). The hematoma may be painful to the touch. °Get help right away if: °· You have severe pain at the catheter insertion area. °· The catheter insertion area swells very fast. °· The catheter insertion area is bleeding, and the bleeding does not stop when you hold steady pressure on the area. °·   The area near or just beyond the catheter insertion site becomes pale, cool, tingly, or numb. °These symptoms may represent a serious problem that is an emergency. Do not wait to see if the symptoms will go away. Get medical help right away. Call your local emergency services (911 in the [place] Do not drive yourself to the hospital. °Summary °· After the procedure, it is common to have bruising and tenderness at the catheter insertion area. °· After the procedure, it is important to rest and drink plenty of fluids. °· Do not take baths,  swim, or use a hot tub until your health care provider says it is okay to do so. You may shower 24-48 hours after the procedure or as told by your health care provider. °· If the catheter site starts bleeding, lie flat and put pressure on the site. If the bleeding does not stop, get help right away. This is a medical emergency. °This information is not intended to replace advice given to you by your health care provider. Make sure you discuss any questions you have with your health care provider. °Document Released: 07/29/2004 Document Revised: 12/16/2015 Document Reviewed: 12/16/2015 °Elsevier Interactive Patient Education © 2017 Elsevier Inc. °Moderate Conscious Sedation, Adult, Care After °These instructions provide you with information about caring for yourself after your procedure. Your health care provider may also give you more specific instructions. Your treatment has been planned according to current medical practices, but problems sometimes occur. Call your health care provider if you have any problems or questions after your procedure. °What can I expect after the procedure? °After your procedure, it is common: °· To feel sleepy for several hours. °· To feel clumsy and have poor balance for several hours. °· To have poor judgment for several hours. °· To vomit if you eat too soon. ° °Follow these instructions at home: °For at least 24 hours after the procedure: ° °· Do not: °? Participate in activities where you could fall or become injured. °? Drive. °? Use heavy machinery. °? Drink alcohol. °? Take sleeping pills or medicines that cause drowsiness. °? Make important decisions or sign legal documents. °? Take care of children on your own. °· Rest. °Eating and drinking °· Follow the diet recommended by your health care provider. °· If you vomit: °? Drink water, juice, or soup when you can drink without vomiting. °? Make sure you have little or no nausea before eating solid foods. °General  instructions °· Have a responsible adult stay with you until you are awake and alert. °· Take over-the-counter and prescription medicines only as told by your health care provider. °· If you smoke, do not smoke without supervision. °· Keep all follow-up visits as told by your health care provider. This is important. °Contact a health care provider if: °· You keep feeling nauseous or you keep vomiting. °· You feel light-headed. °· You develop a rash. °· You have a fever. °Get help right away if: °· You have trouble breathing. °This information is not intended to replace advice given to you by your health care provider. Make sure you discuss any questions you have with your health care provider. °Document Released: 10/31/2012 Document Revised: 06/15/2015 Document Reviewed: 05/02/2015 °Elsevier Interactive Patient Education © 2018 Elsevier Inc. ° ° °

## 2016-10-25 ENCOUNTER — Encounter: Payer: Self-pay | Admitting: Vascular Surgery

## 2016-10-28 ENCOUNTER — Inpatient Hospital Stay: Admission: RE | Admit: 2016-10-28 | Payer: Medicare Other | Source: Ambulatory Visit

## 2016-10-30 MED ORDER — CEFAZOLIN SODIUM-DEXTROSE 1-4 GM/50ML-% IV SOLN: 1.0000 g | Freq: Once | INTRAVENOUS | Status: DC

## 2016-10-31 ENCOUNTER — Ambulatory Visit: Admission: RE | Admit: 2016-10-31 | Payer: Medicare Other | Source: Ambulatory Visit | Admitting: Vascular Surgery

## 2016-10-31 ENCOUNTER — Encounter: Admission: RE | Payer: Self-pay | Source: Ambulatory Visit

## 2016-10-31 SURGERY — LOWER EXTREMITY ANGIOGRAPHY
Anesthesia: Moderate Sedation | Laterality: Left

## 2016-11-07 ENCOUNTER — Other Ambulatory Visit (INDEPENDENT_AMBULATORY_CARE_PROVIDER_SITE_OTHER): Payer: Self-pay | Admitting: Vascular Surgery

## 2016-11-07 DIAGNOSIS — I739 Peripheral vascular disease, unspecified: Secondary | ICD-10-CM

## 2016-11-11 ENCOUNTER — Ambulatory Visit (INDEPENDENT_AMBULATORY_CARE_PROVIDER_SITE_OTHER): Payer: Medicare Other | Admitting: Vascular Surgery

## 2016-11-11 ENCOUNTER — Encounter (INDEPENDENT_AMBULATORY_CARE_PROVIDER_SITE_OTHER): Payer: Medicare Other

## 2016-12-13 ENCOUNTER — Encounter (INDEPENDENT_AMBULATORY_CARE_PROVIDER_SITE_OTHER): Payer: Self-pay | Admitting: Vascular Surgery

## 2016-12-13 ENCOUNTER — Ambulatory Visit (INDEPENDENT_AMBULATORY_CARE_PROVIDER_SITE_OTHER): Payer: Medicare Other

## 2016-12-13 ENCOUNTER — Ambulatory Visit (INDEPENDENT_AMBULATORY_CARE_PROVIDER_SITE_OTHER): Payer: Medicare Other | Admitting: Vascular Surgery

## 2016-12-13 ENCOUNTER — Encounter (INDEPENDENT_AMBULATORY_CARE_PROVIDER_SITE_OTHER): Payer: Self-pay

## 2016-12-13 VITALS — BP 115/68 | HR 90 | Resp 17 | Wt 184.0 lb

## 2016-12-13 DIAGNOSIS — I739 Peripheral vascular disease, unspecified: Secondary | ICD-10-CM

## 2016-12-13 DIAGNOSIS — I70213 Atherosclerosis of native arteries of extremities with intermittent claudication, bilateral legs: Secondary | ICD-10-CM | POA: Diagnosis not present

## 2016-12-13 DIAGNOSIS — E119 Type 2 diabetes mellitus without complications: Secondary | ICD-10-CM | POA: Diagnosis not present

## 2016-12-13 NOTE — Progress Notes (Signed)
Subjective:    Patient ID: Gerald Clark, male    DOB: Feb 04, 1945, 71 y.o.   MRN: [id number]  Chief Complaint: First post procedure follow-up  Patient presents for his first post procedure follow-up.  The patient is status post a right lower extremity angiogram with intervention consisting of angioplasty and stent placement on October 24, 2016.  He presents today without complaint.  The patient states market improvement in the discomfort he was experiencing.  The patient is able to ambulate without claudication at this time.  The patient underwent a bilateral ABI which was notable for triphasic right tibials and biphasic left tibials.  No significant right lower extremity arterial disease.  The left ankle brachial index suggests moderate left lower extremity arterial occlusive disease.  There is no previous ankle-brachial index for comparison.  The patient denies any fever, nausea or vomiting.   Review of Systems  Constitutional: Negative.   HENT: Negative.   Eyes: Negative.   Respiratory: Negative.   Cardiovascular: Negative.   Gastrointestinal: Negative.   Endocrine: Negative.   Genitourinary: Negative.   Musculoskeletal: Negative.   Skin: Negative.   Allergic/Immunologic: Negative.   Neurological: Negative.   Hematological: Negative.   Psychiatric/Behavioral: Negative.       Objective:   Physical Exam  Constitutional: He is oriented to person, place, and time. He appears well-developed and well-nourished. No distress.  HENT:  Head: Normocephalic and atraumatic.  Eyes: Conjunctivae are normal. Pupils are equal, round, and reactive to light.  Neck: Normal range of motion.  Cardiovascular: Normal rate, regular rhythm, normal heart sounds and intact distal pulses.  Pulses:      Radial pulses are 2+ on the right side, and 2+ on the left side.       Dorsalis pedis pulses are 2+ on the right side, and 1+ on the left side.       Posterior tibial pulses are 2+ on the right side,  and 1+ on the left side.  Pulmonary/Chest: Effort normal and breath sounds normal.  Musculoskeletal: Normal range of motion. He exhibits no edema.  Neurological: He is alert and oriented to person, place, and time.  Skin: Skin is warm and dry. He is not diaphoretic.  Psychiatric: He has a normal mood and affect. His behavior is normal. Judgment and thought content normal.  Vitals reviewed.  BP 115/68 (BP Location: Right Arm)   Pulse 90   Resp 17   Wt 184 lb (83.5 kg)   BMI 28.82 kg/m   Past Medical History:  Diagnosis Date  . Arthritis   . Asthma    childhood asthma  . Cancer ([place])    Basal Cell Skin Cancer  . Diabetes mellitus without complication ([place])   . GERD (gastroesophageal reflux disease)   . Glaucoma   . History of chicken pox   . Hyperlipemia   . Hypertension   . Peripheral vascular disease ([place])    Social History   Socioeconomic History  . Marital status: Married    Spouse name: Not on file  . Number of children: Not on file  . Years of education: Not on file  . Highest education level: Not on file  Social Needs  . Financial resource strain: Not on file  . Food insecurity - worry: Not on file  . Food insecurity - inability: Not on file  . Transportation needs - medical: Not on file  . Transportation needs - non-medical: Not on file  Occupational History  . Not on  file  Tobacco Use  . Smoking status: Former Smoker    Packs/day: 2.00    Types: Cigarettes    Last attempt to quit: 04/25/2011    Years since quitting: 5.6  . Smokeless tobacco: Never Used  Substance and Sexual Activity  . Alcohol use: Yes    Alcohol/week: 3.0 oz    Types: 5 Cans of beer per week    Comment: occas  . Drug use: No  . Sexual activity: Not on file  Other Topics Concern  . Not on file  Social History Narrative  . Not on file   Past Surgical History:  Procedure Laterality Date  . COLONOSCOPY N/A 06/02/2014   Procedure: COLONOSCOPY;  Surgeon: Josefine Class, MD;   Location[facility];  Service: Endoscopy;  Laterality: N/A;  . INGUINAL HERNIA REPAIR Left 06/28/2016   Procedure: HERNIA REPAIR INGUINAL ADULT WITH MESH;  Surgeon: Leonie Green, MD;  Location: [facility] ORS;  Service: General;  Laterality: Left;  . LOWER EXTREMITY ANGIOGRAPHY Right 10/24/2016   Procedure: Lower Extremity Angiography;  Surgeon: Algernon Huxley, MD;  Location: [place] [facility];  Service: Cardiovascular;  Laterality: Right;   Family History  Problem Relation Age of Onset  . Hypertension Mother   . Hypertension Father   . Cancer Father   . Breast cancer Sister   . Cancer Sister    No Known Allergies     Assessment & Plan:  Patient presents for his first post procedure follow-up.  The patient is status post a right lower extremity angiogram with intervention consisting of angioplasty and stent placement on October 24, 2016.  He presents today without complaint.  The patient states market improvement in the discomfort he was experiencing.  The patient is able to ambulate without claudication at this time.  The patient underwent a bilateral ABI which was notable for triphasic right tibials and biphasic left tibials.  No significant right lower extremity arterial disease.  The left ankle brachial index suggests moderate left lower extremity arterial occlusive disease.  There is no previous ankle-brachial index for comparison.  The patient denies any fever, nausea or vomiting.  1. PAD (peripheral artery disease) ([place]) - Stable Patient presents with improvement to his right lower extremity claudication and physical exam  Right lower extremity ABI with normal arterial blood flow and triphasic tibialis. Patient with moderate disease to the left lower extremity and biphasic tibials however the patient is asymptomatic at this time There is no indication for intervention at this time The patient is to follow-up in 6 months for repeat ABI and right lower extremity arterial  duplex.  - VAS [place] ABI WITH/WO TBI; Future - VAS [place] LOWER EXTREMITY ARTERIAL DUPLEX; Future  2. Diabetes mellitus without complication ([place]) - Stable Encouraged good control as its slows the progression of atherosclerotic disease  3. Atherosclerosis of native artery of both lower extremities with intermittent claudication ([facility]) - Stable As above  Current Outpatient Medications on File Prior to Visit  Medication Sig Dispense Refill  . aspirin 81 MG tablet Take 81 mg by mouth daily.    . brimonidine (ALPHAGAN) 0.2 % ophthalmic solution Place 1 drop into both eyes 2 (two) times daily.    . Cinnamon 500 MG capsule Take 500 mg by mouth daily.    . clopidogrel (PLAVIX) 75 MG tablet Take 1 tablet (75 mg total) by mouth daily. 30 tablet 11  . dorzolamide (TRUSOPT) 2 % ophthalmic solution Place 1 drop into both eyes 2 (  two) times daily.    . empagliflozin (JARDIANCE) 10 MG TABS tablet Take 10 mg by mouth daily.    Marland Kitchen glipiZIDE (GLUCOTROL) 10 MG tablet Take 10 mg by mouth 2 (two) times daily before a meal.     . hydrochlorothiazide (HYDRODIURIL) 25 MG tablet Take 25 mg by mouth daily.    Marland Kitchen latanoprost (XALATAN) 0.005 % ophthalmic solution Place 1 drop into both eyes at bedtime.    Marland Kitchen lisinopril (PRINIVIL,ZESTRIL) 40 MG tablet Take 40 mg by mouth daily.     . Magnesium Oxide 250 MG TABS Take 250 mg by mouth daily.    . metFORMIN (GLUCOPHAGE) 1000 MG tablet Take 1,000 mg by mouth 2 (two) times daily with a meal.    . niacin 500 MG CR capsule Take 500 mg by mouth at bedtime.     . pioglitazone (ACTOS) 15 MG tablet TAKE 1 TABLET (15 MG TOTAL) BY MOUTH ONCE DAILY.  5  . Polyethyl Glycol-Propyl Glycol (SYSTANE ULTRA OP) Apply 1 drop to eye 3 (three) times daily as needed (Dry eyes).    . Potassium 99 MG TABS Take 99 mg by mouth daily.    . simvastatin (ZOCOR) 40 MG tablet Take 20 mg by mouth daily at 6 PM. TAKES 0.5 TABLET    . timolol (TIMOPTIC) 0.5 % ophthalmic solution Place 1 drop into both eyes 2  (two) times daily.     No current facility-administered medications on file prior to visit.    There are no Patient Instructions on file for this visit. Return in about 6 months (around 06/12/2017), or if symptoms worsen or fail to improve.  Tobie Perdue A Willam Munford, PA-C

## 2017-06-13 ENCOUNTER — Encounter (INDEPENDENT_AMBULATORY_CARE_PROVIDER_SITE_OTHER): Payer: Medicare Other

## 2017-06-13 ENCOUNTER — Ambulatory Visit (INDEPENDENT_AMBULATORY_CARE_PROVIDER_SITE_OTHER): Payer: Medicare Other | Admitting: Vascular Surgery

## 2017-10-11 ENCOUNTER — Other Ambulatory Visit (INDEPENDENT_AMBULATORY_CARE_PROVIDER_SITE_OTHER): Payer: Self-pay | Admitting: Vascular Surgery

## 2018-01-30 DIAGNOSIS — H401133 Primary open-angle glaucoma, bilateral, severe stage: Secondary | ICD-10-CM | POA: Diagnosis not present

## 2018-01-30 DIAGNOSIS — H2513 Age-related nuclear cataract, bilateral: Secondary | ICD-10-CM | POA: Diagnosis not present

## 2018-02-27 DIAGNOSIS — H401133 Primary open-angle glaucoma, bilateral, severe stage: Secondary | ICD-10-CM | POA: Diagnosis not present

## 2018-02-27 DIAGNOSIS — H2513 Age-related nuclear cataract, bilateral: Secondary | ICD-10-CM | POA: Diagnosis not present

## 2018-07-10 DIAGNOSIS — H2513 Age-related nuclear cataract, bilateral: Secondary | ICD-10-CM | POA: Diagnosis not present

## 2018-07-10 DIAGNOSIS — H401133 Primary open-angle glaucoma, bilateral, severe stage: Secondary | ICD-10-CM | POA: Diagnosis not present

## 2018-09-04 DIAGNOSIS — I739 Peripheral vascular disease, unspecified: Secondary | ICD-10-CM | POA: Diagnosis not present

## 2018-09-04 DIAGNOSIS — I10 Essential (primary) hypertension: Secondary | ICD-10-CM | POA: Diagnosis not present

## 2018-09-04 DIAGNOSIS — E118 Type 2 diabetes mellitus with unspecified complications: Secondary | ICD-10-CM | POA: Diagnosis not present

## 2018-09-04 DIAGNOSIS — E7849 Other hyperlipidemia: Secondary | ICD-10-CM | POA: Diagnosis not present

## 2018-09-04 DIAGNOSIS — Z125 Encounter for screening for malignant neoplasm of prostate: Secondary | ICD-10-CM | POA: Diagnosis not present

## 2018-09-04 DIAGNOSIS — D649 Anemia, unspecified: Secondary | ICD-10-CM | POA: Diagnosis not present

## 2018-09-11 DIAGNOSIS — R972 Elevated prostate specific antigen [PSA]: Secondary | ICD-10-CM | POA: Diagnosis not present

## 2018-09-11 DIAGNOSIS — E118 Type 2 diabetes mellitus with unspecified complications: Secondary | ICD-10-CM | POA: Diagnosis not present

## 2018-09-11 DIAGNOSIS — I10 Essential (primary) hypertension: Secondary | ICD-10-CM | POA: Diagnosis not present

## 2018-09-11 DIAGNOSIS — Z0000 Encounter for general adult medical examination without abnormal findings: Secondary | ICD-10-CM | POA: Diagnosis not present

## 2018-09-11 DIAGNOSIS — E7849 Other hyperlipidemia: Secondary | ICD-10-CM | POA: Diagnosis not present

## 2018-09-11 DIAGNOSIS — D649 Anemia, unspecified: Secondary | ICD-10-CM | POA: Diagnosis not present

## 2018-09-11 DIAGNOSIS — I739 Peripheral vascular disease, unspecified: Secondary | ICD-10-CM | POA: Diagnosis not present

## 2018-09-11 DIAGNOSIS — I70219 Atherosclerosis of native arteries of extremities with intermittent claudication, unspecified extremity: Secondary | ICD-10-CM | POA: Diagnosis not present

## 2018-09-11 DIAGNOSIS — D72829 Elevated white blood cell count, unspecified: Secondary | ICD-10-CM | POA: Diagnosis not present

## 2018-10-16 DIAGNOSIS — D72829 Elevated white blood cell count, unspecified: Secondary | ICD-10-CM | POA: Diagnosis not present

## 2018-10-16 DIAGNOSIS — R972 Elevated prostate specific antigen [PSA]: Secondary | ICD-10-CM | POA: Diagnosis not present

## 2018-10-25 DIAGNOSIS — R000 Tachycardia, unspecified: Secondary | ICD-10-CM | POA: Diagnosis not present

## 2018-10-25 DIAGNOSIS — J9601 Acute respiratory failure with hypoxia: Secondary | ICD-10-CM | POA: Diagnosis not present

## 2018-10-25 DIAGNOSIS — Z20828 Contact with and (suspected) exposure to other viral communicable diseases: Secondary | ICD-10-CM | POA: Diagnosis not present

## 2018-11-13 DIAGNOSIS — T6594XA Toxic effect of unspecified substance, undetermined, initial encounter: Secondary | ICD-10-CM | POA: Diagnosis not present

## 2018-11-13 DIAGNOSIS — H401133 Primary open-angle glaucoma, bilateral, severe stage: Secondary | ICD-10-CM | POA: Diagnosis not present

## 2018-11-13 DIAGNOSIS — H2513 Age-related nuclear cataract, bilateral: Secondary | ICD-10-CM | POA: Diagnosis not present

## 2018-11-16 DIAGNOSIS — E1151 Type 2 diabetes mellitus with diabetic peripheral angiopathy without gangrene: Secondary | ICD-10-CM | POA: Diagnosis not present

## 2018-11-16 DIAGNOSIS — I10 Essential (primary) hypertension: Secondary | ICD-10-CM | POA: Diagnosis not present

## 2018-11-16 DIAGNOSIS — E1140 Type 2 diabetes mellitus with diabetic neuropathy, unspecified: Secondary | ICD-10-CM | POA: Diagnosis not present

## 2018-11-16 DIAGNOSIS — I70219 Atherosclerosis of native arteries of extremities with intermittent claudication, unspecified extremity: Secondary | ICD-10-CM | POA: Diagnosis not present

## 2018-11-16 DIAGNOSIS — M47816 Spondylosis without myelopathy or radiculopathy, lumbar region: Secondary | ICD-10-CM | POA: Diagnosis not present

## 2018-11-16 DIAGNOSIS — Z87891 Personal history of nicotine dependence: Secondary | ICD-10-CM | POA: Diagnosis not present

## 2018-11-16 DIAGNOSIS — L821 Other seborrheic keratosis: Secondary | ICD-10-CM | POA: Diagnosis not present

## 2018-11-16 DIAGNOSIS — L578 Other skin changes due to chronic exposure to nonionizing radiation: Secondary | ICD-10-CM | POA: Diagnosis not present

## 2018-11-21 ENCOUNTER — Ambulatory Visit: Payer: Self-pay | Admitting: Urology

## 2018-11-25 ENCOUNTER — Emergency Department: Payer: Medicare HMO

## 2018-11-25 ENCOUNTER — Inpatient Hospital Stay
Admission: EM | Admit: 2018-11-25 | Discharge: 2018-12-04 | DRG: 196 | Disposition: A | Discharge status: Home Health | Payer: Medicare HMO | Attending: Internal Medicine | Admitting: Internal Medicine

## 2018-11-25 ENCOUNTER — Encounter: Payer: Self-pay | Admitting: Emergency Medicine

## 2018-11-25 ENCOUNTER — Other Ambulatory Visit: Payer: Self-pay

## 2018-11-25 DIAGNOSIS — J441 Chronic obstructive pulmonary disease with (acute) exacerbation: Secondary | ICD-10-CM | POA: Diagnosis not present

## 2018-11-25 DIAGNOSIS — J449 Chronic obstructive pulmonary disease, unspecified: Secondary | ICD-10-CM | POA: Diagnosis not present

## 2018-11-25 DIAGNOSIS — J439 Emphysema, unspecified: Secondary | ICD-10-CM | POA: Diagnosis present

## 2018-11-25 DIAGNOSIS — I2510 Atherosclerotic heart disease of native coronary artery without angina pectoris: Secondary | ICD-10-CM | POA: Diagnosis present

## 2018-11-25 DIAGNOSIS — R0603 Acute respiratory distress: Secondary | ICD-10-CM

## 2018-11-25 DIAGNOSIS — E876 Hypokalemia: Secondary | ICD-10-CM | POA: Diagnosis present

## 2018-11-25 DIAGNOSIS — R918 Other nonspecific abnormal finding of lung field: Secondary | ICD-10-CM

## 2018-11-25 DIAGNOSIS — I10 Essential (primary) hypertension: Secondary | ICD-10-CM | POA: Diagnosis not present

## 2018-11-25 DIAGNOSIS — E119 Type 2 diabetes mellitus without complications: Secondary | ICD-10-CM | POA: Diagnosis not present

## 2018-11-25 DIAGNOSIS — I739 Peripheral vascular disease, unspecified: Secondary | ICD-10-CM | POA: Diagnosis present

## 2018-11-25 DIAGNOSIS — E785 Hyperlipidemia, unspecified: Secondary | ICD-10-CM | POA: Diagnosis present

## 2018-11-25 DIAGNOSIS — H409 Unspecified glaucoma: Secondary | ICD-10-CM | POA: Diagnosis present

## 2018-11-25 DIAGNOSIS — D72825 Bandemia: Secondary | ICD-10-CM | POA: Diagnosis present

## 2018-11-25 DIAGNOSIS — E1165 Type 2 diabetes mellitus with hyperglycemia: Secondary | ICD-10-CM | POA: Diagnosis not present

## 2018-11-25 DIAGNOSIS — J4540 Moderate persistent asthma, uncomplicated: Secondary | ICD-10-CM | POA: Diagnosis present

## 2018-11-25 DIAGNOSIS — R0902 Hypoxemia: Secondary | ICD-10-CM | POA: Diagnosis not present

## 2018-11-25 DIAGNOSIS — T380X5A Adverse effect of glucocorticoids and synthetic analogues, initial encounter: Secondary | ICD-10-CM | POA: Diagnosis not present

## 2018-11-25 DIAGNOSIS — J849 Interstitial pulmonary disease, unspecified: Secondary | ICD-10-CM

## 2018-11-25 DIAGNOSIS — J9621 Acute and chronic respiratory failure with hypoxia: Secondary | ICD-10-CM | POA: Diagnosis not present

## 2018-11-25 DIAGNOSIS — E440 Moderate protein-calorie malnutrition: Secondary | ICD-10-CM | POA: Diagnosis not present

## 2018-11-25 DIAGNOSIS — J189 Pneumonia, unspecified organism: Secondary | ICD-10-CM | POA: Diagnosis not present

## 2018-11-25 DIAGNOSIS — Z6825 Body mass index (BMI) 25.0-25.9, adult: Secondary | ICD-10-CM

## 2018-11-25 DIAGNOSIS — Z7902 Long term (current) use of antithrombotics/antiplatelets: Secondary | ICD-10-CM | POA: Diagnosis not present

## 2018-11-25 DIAGNOSIS — R509 Fever, unspecified: Secondary | ICD-10-CM | POA: Diagnosis not present

## 2018-11-25 DIAGNOSIS — K5900 Constipation, unspecified: Secondary | ICD-10-CM | POA: Diagnosis not present

## 2018-11-25 DIAGNOSIS — Z66 Do not resuscitate: Secondary | ICD-10-CM | POA: Diagnosis present

## 2018-11-25 DIAGNOSIS — J8410 Pulmonary fibrosis, unspecified: Principal | ICD-10-CM | POA: Diagnosis present

## 2018-11-25 DIAGNOSIS — E222 Syndrome of inappropriate secretion of antidiuretic hormone: Secondary | ICD-10-CM | POA: Diagnosis not present

## 2018-11-25 DIAGNOSIS — I472 Ventricular tachycardia: Secondary | ICD-10-CM | POA: Diagnosis not present

## 2018-11-25 DIAGNOSIS — J9601 Acute respiratory failure with hypoxia: Secondary | ICD-10-CM | POA: Diagnosis present

## 2018-11-25 DIAGNOSIS — E1151 Type 2 diabetes mellitus with diabetic peripheral angiopathy without gangrene: Secondary | ICD-10-CM | POA: Diagnosis not present

## 2018-11-25 DIAGNOSIS — Z87891 Personal history of nicotine dependence: Secondary | ICD-10-CM

## 2018-11-25 DIAGNOSIS — E871 Hypo-osmolality and hyponatremia: Secondary | ICD-10-CM | POA: Diagnosis not present

## 2018-11-25 DIAGNOSIS — R0602 Shortness of breath: Secondary | ICD-10-CM | POA: Diagnosis not present

## 2018-11-25 DIAGNOSIS — Z79899 Other long term (current) drug therapy: Secondary | ICD-10-CM

## 2018-11-25 DIAGNOSIS — K219 Gastro-esophageal reflux disease without esophagitis: Secondary | ICD-10-CM | POA: Diagnosis present

## 2018-11-25 DIAGNOSIS — E878 Other disorders of electrolyte and fluid balance, not elsewhere classified: Secondary | ICD-10-CM | POA: Diagnosis present

## 2018-11-25 DIAGNOSIS — R590 Localized enlarged lymph nodes: Secondary | ICD-10-CM | POA: Diagnosis present

## 2018-11-25 DIAGNOSIS — Z85828 Personal history of other malignant neoplasm of skin: Secondary | ICD-10-CM

## 2018-11-25 DIAGNOSIS — Z7982 Long term (current) use of aspirin: Secondary | ICD-10-CM

## 2018-11-25 DIAGNOSIS — J9600 Acute respiratory failure, unspecified whether with hypoxia or hypercapnia: Secondary | ICD-10-CM | POA: Diagnosis not present

## 2018-11-25 DIAGNOSIS — Z7984 Long term (current) use of oral hypoglycemic drugs: Secondary | ICD-10-CM

## 2018-11-25 DIAGNOSIS — Z20828 Contact with and (suspected) exposure to other viral communicable diseases: Secondary | ICD-10-CM | POA: Diagnosis present

## 2018-11-25 DIAGNOSIS — Z8249 Family history of ischemic heart disease and other diseases of the circulatory system: Secondary | ICD-10-CM

## 2018-11-25 LAB — CBC WITH DIFFERENTIAL/PLATELET
Abs Immature Granulocytes: 0.13 10*3/uL — ABNORMAL HIGH (ref 0.00–0.07)
Basophils Absolute: 0.1 10*3/uL (ref 0.0–0.1)
Basophils Relative: 1 %
Eosinophils Absolute: 0.2 10*3/uL (ref 0.0–0.5)
Eosinophils Relative: 1 %
HCT: 44.1 % (ref 39.0–52.0)
Hemoglobin: 14.4 g/dL (ref 13.0–17.0)
Immature Granulocytes: 1 %
Lymphocytes Relative: 10 %
Lymphs Abs: 1.6 10*3/uL (ref 0.7–4.0)
MCH: 27.8 pg (ref 26.0–34.0)
MCHC: 32.7 g/dL (ref 30.0–36.0)
MCV: 85.1 fL (ref 80.0–100.0)
Monocytes Absolute: 1.3 10*3/uL — ABNORMAL HIGH (ref 0.1–1.0)
Monocytes Relative: 8 %
Neutro Abs: 13 10*3/uL — ABNORMAL HIGH (ref 1.7–7.7)
Neutrophils Relative %: 79 %
Platelets: 416 10*3/uL — ABNORMAL HIGH (ref 150–400)
RBC: 5.18 MIL/uL (ref 4.22–5.81)
RDW: 14.6 % (ref 11.5–15.5)
WBC: 16.4 10*3/uL — ABNORMAL HIGH (ref 4.0–10.5)
nRBC: 0 % (ref 0.0–0.2)

## 2018-11-25 LAB — BLOOD GAS, VENOUS
Acid-Base Excess: 2.3 mmol/L — ABNORMAL HIGH (ref 0.0–2.0)
Bicarbonate: 26.5 mmol/L (ref 20.0–28.0)
O2 Saturation: 45.8 %
Patient temperature: 37
pCO2, Ven: 39 mmHg — ABNORMAL LOW (ref 44.0–60.0)
pH, Ven: 7.44 — ABNORMAL HIGH (ref 7.250–7.430)

## 2018-11-25 LAB — BASIC METABOLIC PANEL
Anion gap: 13 (ref 5–15)
BUN: 17 mg/dL (ref 8–23)
CO2: 24 mmol/L (ref 22–32)
Calcium: 9.9 mg/dL (ref 8.9–10.3)
Chloride: 95 mmol/L — ABNORMAL LOW (ref 98–111)
Creatinine, Ser: 1.03 mg/dL (ref 0.61–1.24)
GFR calc Af Amer: >60 mL/min (ref >60)
GFR calc non Af Amer: >60 mL/min (ref >60)
Glucose, Bld: 94 mg/dL (ref 70–99)
Potassium: 3.4 mmol/L — ABNORMAL LOW (ref 3.5–5.1)
Sodium: 132 mmol/L — ABNORMAL LOW (ref 135–145)

## 2018-11-25 LAB — FERRITIN: Ferritin: 183 ng/mL (ref 24–336)

## 2018-11-25 LAB — LACTIC ACID, PLASMA
Lactic Acid, Venous: 2.9 mmol/L (ref 0.5–1.9)
Lactic Acid, Venous: 1.9 mmol/L (ref 0.5–1.9)

## 2018-11-25 LAB — TROPONIN I (HIGH SENSITIVITY)
Troponin I (High Sensitivity): 23 ng/L — ABNORMAL HIGH (ref <18)
Troponin I (High Sensitivity): 25 ng/L — ABNORMAL HIGH (ref <18)

## 2018-11-25 LAB — SARS CORONAVIRUS 2 BY RT PCR (HOSPITAL ORDER, PERFORMED IN ~~LOC~~ HOSPITAL LAB): SARS Coronavirus 2: NEGATIVE

## 2018-11-25 LAB — GLUCOSE, CAPILLARY: Glucose-Capillary: 156 mg/dL — ABNORMAL HIGH (ref 70–99)

## 2018-11-25 LAB — LACTATE DEHYDROGENASE: LDH: 375 U/L — ABNORMAL HIGH (ref 98–192)

## 2018-11-25 LAB — FIBRIN DERIVATIVES D-DIMER (ARMC ONLY): Fibrin derivatives D-dimer (ARMC): 534.04 ng/mL (FEU) — ABNORMAL HIGH (ref 0.00–499.00)

## 2018-11-25 LAB — MAGNESIUM: Magnesium: 1.9 mg/dL (ref 1.7–2.4)

## 2018-11-25 LAB — BRAIN NATRIURETIC PEPTIDE: B Natriuretic Peptide: 84 pg/mL (ref 0.0–100.0)

## 2018-11-25 MED ORDER — HEPARIN BOLUS VIA INFUSION
4000.0000 [IU] | Freq: Once | INTRAVENOUS | Status: AC
  Administered 2018-11-25: 4000 [IU] via INTRAVENOUS
  Filled 2018-11-25: qty 4000

## 2018-11-25 MED ORDER — SODIUM CHLORIDE 0.9 % IV BOLUS: 500.0000 mL | Freq: Once | INTRAVENOUS | Status: DC

## 2018-11-25 MED ORDER — SODIUM CHLORIDE 0.9 % IV BOLUS
1000.0000 mL | Freq: Once | INTRAVENOUS | Status: AC
  Administered 2018-11-25: 1000 mL via INTRAVENOUS

## 2018-11-25 MED ORDER — INSULIN ASPART 100 UNIT/ML ~~LOC~~ SOLN
0.0000 [IU] | Freq: Three times a day (TID) | SUBCUTANEOUS | Status: DC
  Administered 2018-11-26: 1 [IU] via SUBCUTANEOUS
  Administered 2018-11-26: 2 [IU] via SUBCUTANEOUS
  Administered 2018-11-26: 1 [IU] via SUBCUTANEOUS
  Administered 2018-11-27 (×3): 2 [IU] via SUBCUTANEOUS
  Administered 2018-11-28: 9 [IU] via SUBCUTANEOUS
  Administered 2018-11-28: 3 [IU] via SUBCUTANEOUS
  Administered 2018-11-28: 2 [IU] via SUBCUTANEOUS
  Administered 2018-11-29: 3 [IU] via SUBCUTANEOUS
  Administered 2018-11-29 (×2): 5 [IU] via SUBCUTANEOUS
  Administered 2018-11-30 (×2): 3 [IU] via SUBCUTANEOUS
  Administered 2018-11-30 – 2018-12-01 (×2): 9 [IU] via SUBCUTANEOUS
  Administered 2018-12-01: 7 [IU] via SUBCUTANEOUS
  Administered 2018-12-01: 2 [IU] via SUBCUTANEOUS
  Administered 2018-12-02 (×2): 7 [IU] via SUBCUTANEOUS
  Filled 2018-11-25 (×19): qty 1

## 2018-11-25 MED ORDER — ONDANSETRON HCL 4 MG PO TABS: 4.0000 mg | ORAL_TABLET | Freq: Four times a day (QID) | ORAL | Status: DC | PRN

## 2018-11-25 MED ORDER — ASPIRIN EC 81 MG PO TBEC
81.0000 mg | DELAYED_RELEASE_TABLET | Freq: Every day | ORAL | Status: DC
  Administered 2018-11-26 – 2018-12-04 (×9): 81 mg via ORAL
  Filled 2018-11-25 (×9): qty 1

## 2018-11-25 MED ORDER — ACETAMINOPHEN 325 MG PO TABS
650.0000 mg | ORAL_TABLET | Freq: Four times a day (QID) | ORAL | Status: DC | PRN
  Administered 2018-11-29 – 2018-12-02 (×5): 650 mg via ORAL
  Filled 2018-11-25 (×5): qty 2

## 2018-11-25 MED ORDER — IPRATROPIUM-ALBUTEROL 0.5-2.5 (3) MG/3ML IN SOLN
3.0000 mL | Freq: Four times a day (QID) | RESPIRATORY_TRACT | Status: DC
  Administered 2018-11-26 – 2018-11-29 (×13): 3 mL via RESPIRATORY_TRACT
  Filled 2018-11-25 (×13): qty 3

## 2018-11-25 MED ORDER — BRIMONIDINE TARTRATE 0.2 % OP SOLN
1.0000 [drp] | Freq: Two times a day (BID) | OPHTHALMIC | Status: DC
  Administered 2018-11-26 – 2018-12-04 (×17): 1 [drp] via OPHTHALMIC
  Filled 2018-11-25: qty 5

## 2018-11-25 MED ORDER — POTASSIUM 99 MG PO TABS: 99.0000 mg | ORAL_TABLET | Freq: Every day | ORAL | Status: DC

## 2018-11-25 MED ORDER — HEPARIN (PORCINE) 25000 UT/250ML-% IV SOLN
850.0000 [IU]/h | INTRAVENOUS | Status: DC
  Administered 2018-11-25: 850 [IU]/h via INTRAVENOUS
  Filled 2018-11-25: qty 250

## 2018-11-25 MED ORDER — ACETAMINOPHEN 650 MG RE SUPP: 650.0000 mg | Freq: Four times a day (QID) | RECTAL | Status: DC | PRN

## 2018-11-25 MED ORDER — HEPARIN SODIUM (PORCINE) 1000 UNIT/ML IJ SOLN: 4000.0000 [IU] | Freq: Once | INTRAMUSCULAR | Status: DC

## 2018-11-25 MED ORDER — SODIUM CHLORIDE 0.9 % IV SOLN
2.0000 g | INTRAVENOUS | Status: DC
  Administered 2018-11-25: 2 g via INTRAVENOUS
  Filled 2018-11-25: qty 20

## 2018-11-25 MED ORDER — MAGNESIUM OXIDE 400 (241.3 MG) MG PO TABS
400.0000 mg | ORAL_TABLET | Freq: Every day | ORAL | Status: DC
  Administered 2018-11-26 – 2018-12-04 (×9): 400 mg via ORAL
  Filled 2018-11-25 (×9): qty 1

## 2018-11-25 MED ORDER — DORZOLAMIDE HCL 2 % OP SOLN
1.0000 [drp] | Freq: Two times a day (BID) | OPHTHALMIC | Status: DC
  Administered 2018-11-26 – 2018-12-04 (×17): 1 [drp] via OPHTHALMIC
  Filled 2018-11-25: qty 10

## 2018-11-25 MED ORDER — GLIPIZIDE 10 MG PO TABS
10.0000 mg | ORAL_TABLET | Freq: Two times a day (BID) | ORAL | Status: DC
  Administered 2018-11-26: 10 mg via ORAL
  Filled 2018-11-25 (×2): qty 1

## 2018-11-25 MED ORDER — LISINOPRIL 20 MG PO TABS
40.0000 mg | ORAL_TABLET | Freq: Every day | ORAL | Status: DC
  Administered 2018-11-26 – 2018-11-29 (×4): 40 mg via ORAL
  Filled 2018-11-25: qty 2
  Filled 2018-11-25: qty 4
  Filled 2018-11-25 (×5): qty 2

## 2018-11-25 MED ORDER — EMPAGLIFLOZIN 10 MG PO TABS
10.0000 mg | ORAL_TABLET | Freq: Every day | ORAL | Status: DC
  Administered 2018-11-26: 10 mg via ORAL

## 2018-11-25 MED ORDER — POLYVINYL ALCOHOL 1.4 % OP SOLN
1.0000 [drp] | Freq: Three times a day (TID) | OPHTHALMIC | Status: DC | PRN
  Filled 2018-11-25: qty 15

## 2018-11-25 MED ORDER — CINNAMON 500 MG PO CAPS: 500.0000 mg | ORAL_CAPSULE | Freq: Every day | ORAL | Status: DC

## 2018-11-25 MED ORDER — CLOPIDOGREL BISULFATE 75 MG PO TABS
75.0000 mg | ORAL_TABLET | Freq: Every day | ORAL | Status: DC
  Administered 2018-11-26 – 2018-12-04 (×9): 75 mg via ORAL
  Filled 2018-11-25 (×9): qty 1

## 2018-11-25 MED ORDER — SODIUM CHLORIDE 0.9 % IV SOLN
500.0000 mg | INTRAVENOUS | Status: DC
  Administered 2018-11-26: 500 mg via INTRAVENOUS
  Filled 2018-11-25: qty 500

## 2018-11-25 MED ORDER — ONDANSETRON HCL 4 MG/2ML IJ SOLN
4.0000 mg | Freq: Four times a day (QID) | INTRAMUSCULAR | Status: DC | PRN
  Administered 2018-12-02: 4 mg via INTRAVENOUS
  Filled 2018-11-25: qty 2

## 2018-11-25 MED ORDER — METHYLPREDNISOLONE SODIUM SUCC 125 MG IJ SOLR
80.0000 mg | Freq: Once | INTRAMUSCULAR | Status: AC
  Administered 2018-11-25: 80 mg via INTRAVENOUS
  Filled 2018-11-25: qty 2

## 2018-11-25 MED ORDER — HYDROCHLOROTHIAZIDE 25 MG PO TABS
25.0000 mg | ORAL_TABLET | Freq: Every day | ORAL | Status: DC
  Administered 2018-11-26 – 2018-11-30 (×5): 25 mg via ORAL
  Filled 2018-11-25 (×7): qty 1

## 2018-11-25 MED ORDER — TIMOLOL MALEATE 0.5 % OP SOLN
1.0000 [drp] | Freq: Two times a day (BID) | OPHTHALMIC | Status: DC
  Administered 2018-11-26 – 2018-12-04 (×17): 1 [drp] via OPHTHALMIC
  Filled 2018-11-25: qty 5

## 2018-11-25 MED ORDER — SODIUM CHLORIDE 0.9 % IV SOLN
INTRAVENOUS | Status: DC
  Administered 2018-11-25 – 2018-11-27 (×3): via INTRAVENOUS

## 2018-11-25 MED ORDER — IOHEXOL 350 MG/ML SOLN
75.0000 mL | Freq: Once | INTRAVENOUS | Status: AC | PRN
  Administered 2018-11-25: 75 mL via INTRAVENOUS

## 2018-11-25 MED ORDER — POTASSIUM CHLORIDE 20 MEQ PO PACK
40.0000 meq | PACK | Freq: Once | ORAL | Status: AC
  Administered 2018-11-27: 40 meq via ORAL
  Filled 2018-11-25: qty 2

## 2018-11-25 MED ORDER — TRAZODONE HCL 50 MG PO TABS
25.0000 mg | ORAL_TABLET | Freq: Every evening | ORAL | Status: DC | PRN
  Administered 2018-12-01: 25 mg via ORAL
  Filled 2018-11-25: qty 1

## 2018-11-25 MED ORDER — PIOGLITAZONE HCL 15 MG PO TABS
15.0000 mg | ORAL_TABLET | Freq: Every day | ORAL | Status: DC
  Administered 2018-11-26: 15 mg via ORAL
  Filled 2018-11-25: qty 1

## 2018-11-25 MED ORDER — LATANOPROST 0.005 % OP SOLN
1.0000 [drp] | Freq: Every day | OPHTHALMIC | Status: DC
  Administered 2018-11-26 – 2018-12-03 (×8): 1 [drp] via OPHTHALMIC
  Filled 2018-11-25: qty 2.5

## 2018-11-25 MED ORDER — MAGNESIUM HYDROXIDE 400 MG/5ML PO SUSP
30.0000 mL | Freq: Every day | ORAL | Status: DC | PRN
  Administered 2018-12-02: 30 mL via ORAL
  Filled 2018-11-25: qty 30

## 2018-11-25 MED ORDER — DEXAMETHASONE SODIUM PHOSPHATE 10 MG/ML IJ SOLN
6.0000 mg | INTRAMUSCULAR | Status: DC
  Administered 2018-11-26: 6 mg via INTRAVENOUS
  Filled 2018-11-25: qty 1

## 2018-11-25 MED ORDER — NIACIN ER 500 MG PO TBCR
500.0000 mg | EXTENDED_RELEASE_TABLET | Freq: Every day | ORAL | Status: DC
  Administered 2018-11-28 – 2018-12-03 (×6): 500 mg via ORAL
  Filled 2018-11-25 (×9): qty 1

## 2018-11-25 NOTE — ED Notes (Signed)
This RN accompanied pt to CT. Pt tolerated well. Pt did get SOB with transferring to CT bed and back.

## 2018-11-25 NOTE — ED Triage Notes (Signed)
Pt via pov from home with sob that started 4 weeks ago, gradually worsening. Pt said it got much worse today. Pt alert & oriented with obvious sob, speaking breathlessly.

## 2018-11-25 NOTE — ED Notes (Signed)
Pt states he does not feel SOB as he did earlier now that non rebreather is on

## 2018-11-25 NOTE — ED Notes (Signed)
Pt put on non rebreather in room due to O2 sat 85 % on 6 L.  PT at 100 % on nonrebreather at present

## 2018-11-25 NOTE — ED Provider Notes (Signed)
[facility] Emergency Department Provider Note ____________________________________________   First MD Initiated Contact with Patient 11/25/18 1551     (approximate)  I have reviewed the triage vital signs and the nursing notes.   HISTORY  Chief Complaint Shortness of Breath  HPI Gerald Clark is a 73 y.o. male who presents to the emergency department for treatment of shortness of breath.  Patient states that he was helping his wife move flowers and off the porch this morning and shortness of breath significantly increased.  He states he has been unable to get his breath since then.  He has noticed some increasing shortness of breath over the past 4 weeks and states that his physician has been working with him due to some leg pain.  He states that it was considered arthritis.  Past Medical History:  Diagnosis Date  . Arthritis   . Asthma    childhood asthma  . Cancer ([place])    Basal Cell Skin Cancer  . Diabetes mellitus without complication ([place])   . GERD (gastroesophageal reflux disease)   . Glaucoma   . History of chicken pox   . Hyperlipemia   . Hypertension   . Peripheral vascular disease [facility])     Patient Active Problem List   Diagnosis Date Noted  . PAD (peripheral artery disease) ([place]) 12/13/2016  . Hypertension 10/07/2016  . Diabetes mellitus without complication ([place]) 123456  . Hyperlipemia 10/07/2016  . Atherosclerosis of native arteries of extremity with intermittent claudication ([place]) 10/07/2016    Past Surgical History:  Procedure Laterality Date  . COLONOSCOPY N/A 06/02/2014   Procedure: COLONOSCOPY;  Surgeon: Josefine Class, MD;  Location: [facility] ENDOSCOPY;  Service: Endoscopy;  Laterality: N/A;  . INGUINAL HERNIA REPAIR Left 06/28/2016   Procedure: HERNIA REPAIR INGUINAL ADULT WITH MESH;  Surgeon: Leonie Green, MD;  Location: [facility] ORS;  Service: General;  Laterality: Left;  . LOWER EXTREMITY ANGIOGRAPHY Right  10/24/2016   Procedure: Lower Extremity Angiography;  Surgeon: Algernon Huxley, MD;  Location: [place] CV LAB;  Service: Cardiovascular;  Laterality: Right;    Prior to Admission medications   Medication Sig Start Date End Date Taking? Authorizing Provider  aspirin 81 MG tablet Take 81 mg by mouth daily.    [provider]  brimonidine (ALPHAGAN) 0.2 % ophthalmic solution Place 1 drop into both eyes 2 (two) times daily.    [provider]  Cinnamon 500 MG capsule Take 500 mg by mouth daily.    [provider]  clopidogrel (PLAVIX) 75 MG tablet TAKE 1 TABLET BY MOUTH EVERY DAY 10/11/17   Algernon Huxley, MD  dorzolamide (TRUSOPT) 2 % ophthalmic solution Place 1 drop into both eyes 2 (two) times daily.    [provider]  empagliflozin (JARDIANCE) 10 MG TABS tablet Take 10 mg by mouth daily.    [provider]  glipiZIDE (GLUCOTROL) 10 MG tablet Take 10 mg by mouth 2 (two) times daily before a meal.     [provider]  hydrochlorothiazide (HYDRODIURIL) 25 MG tablet Take 25 mg by mouth daily.    [provider]  latanoprost (XALATAN) 0.005 % ophthalmic solution Place 1 drop into both eyes at bedtime.    [provider]  lisinopril (PRINIVIL,ZESTRIL) 40 MG tablet Take 40 mg by mouth daily.     [provider]  Magnesium Oxide 250 MG TABS Take 250 mg by mouth daily.    [provider]  metFORMIN (  GLUCOPHAGE) 1000 MG tablet Take 1,000 mg by mouth 2 (two) times daily with a meal.    [provider]  niacin 500 MG CR capsule Take 500 mg by mouth at bedtime.     [provider]  pioglitazone (ACTOS) 15 MG tablet TAKE 1 TABLET (15 MG TOTAL) BY MOUTH ONCE DAILY. 06/01/16   [provider]  Polyethyl Glycol-Propyl Glycol (SYSTANE ULTRA OP) Apply 1 drop to eye 3 (three) times daily as needed (Dry eyes).    [provider]  Potassium 99 MG TABS Take 99 mg by mouth daily.    [provider]  simvastatin (ZOCOR) 40 MG tablet Take 20 mg by mouth daily at 6 PM. TAKES 0.5 TABLET    [provider]  timolol (TIMOPTIC) 0.5 % ophthalmic solution Place 1 drop into both eyes 2 (two) times daily.    [provider]    Allergies Patient has no known allergies.  Family History  Problem Relation Age of Onset  . Hypertension Mother   . Hypertension Father   . Cancer Father   . Breast cancer Sister   . Cancer Sister     Social History Social History   Tobacco Use  . Smoking status: Former Smoker    Packs/day: 2.00    Types: Cigarettes    Quit date: 04/25/2011    Years since quitting: 7.5  . Smokeless tobacco: Never Used  Substance Use Topics  . Alcohol use: Yes    Alcohol/week: 5.0 standard drinks    Types: 5 Cans of beer per week    Comment: occas  . Drug use: No    Review of Systems  Constitutional: No fever/chills Eyes: No visual changes. ENT: No sore throat. Cardiovascular: Denies chest pain. Respiratory: Positive for shortness of breath. Gastrointestinal: No abdominal pain.  No nausea, no vomiting.  No diarrhea.  No constipation. Genitourinary: Negative for dysuria. Musculoskeletal: Negative for back pain. Skin: Negative for rash. Neurological: Negative for headaches, focal weakness or numbness. ___________________________________________   PHYSICAL EXAM:  VITAL SIGNS: ED Triage Vitals  Enc Vitals Group     BP 11/25/18 1515 (!) 184/62     Pulse Rate 11/25/18 1515 (!) 126     Resp 11/25/18 1515 (!) 34     Temp 11/25/18 1515 98.5 F (36.9 C)     Temp Source 11/25/18 1515 Oral     SpO2 11/25/18 1515 (!) 72 %     Weight 11/25/18 1517 160 lb (72.6 kg)     Height 11/25/18 1517 5\' 6"  (1.676 m)     Head Circumference --      Peak Flow --      Pain Score 11/25/18 1516 2     Pain Loc --      Pain Edu? --      Excl. in [place]? --     Constitutional: Alert and oriented.  Acutely ill appearing and in no acute distress. Eyes:  Conjunctivae are normal. PERRL. EOMI. Head: Atraumatic. Nose: No congestion/rhinnorhea. Mouth/Throat: Mucous membranes are moist.  Oropharynx non-erythematous. Neck: No stridor.   Hematological/Lymphatic/Immunilogical: No cervical lymphadenopathy. Cardiovascular: Normal rate, regular rhythm. Grossly normal heart sounds.  Good peripheral circulation. Respiratory: Normal respiratory effort.  No retractions. Lungs CTAB. Gastrointestinal: Soft and nontender. No distention. No abdominal bruits. No CVA tenderness. Genitourinary:  Musculoskeletal: No lower extremity tenderness nor edema.  No joint effusions. Neurologic:  Normal speech and language. No gross focal neurologic deficits are appreciated. No gait instability. Skin:  Skin is warm, dry and intact. No rash noted. Psychiatric: Mood and affect are normal. Speech and behavior are normal.  ____________________________________________   LABS (all labs ordered are listed, but only abnormal results are displayed)  Labs Reviewed  CBC WITH DIFFERENTIAL/PLATELET - Abnormal; Notable for the following components:      Result Value   WBC 16.4 (*)    Platelets 416 (*)    Neutro Abs 13.0 (*)    Monocytes Absolute 1.3 (*)    Abs Immature Granulocytes 0.13 (*)    All other components within normal limits  BASIC METABOLIC PANEL - Abnormal; Notable for the following components:   Sodium 132 (*)    Potassium 3.4 (*)    Chloride 95 (*)    All other components within normal limits  FIBRIN DERIVATIVES D-DIMER ([facility] ONLY) - Abnormal; Notable for the following components:   Fibrin derivatives D-dimer ([facility]) 534.04 (*)    All other components within normal limits  BLOOD GAS, VENOUS - Abnormal; Notable for the following components:   pH, Ven 7.44 (*)    pCO2, Ven 39 (*)    Acid-Base Excess 2.3 (*)    All other components within normal limits  LACTIC ACID, PLASMA - Abnormal; Notable for the following components:   Lactic Acid, Venous 2.9 (*)    All  other components within normal limits  TROPONIN I (HIGH SENSITIVITY) - Abnormal; Notable for the following components:   Troponin I (High Sensitivity) 23 (*)    All other components within normal limits  TROPONIN I (HIGH SENSITIVITY) - Abnormal; Notable for the following components:   Troponin I (High Sensitivity) 25 (*)    All other components within normal limits  SARS CORONAVIRUS 2 BY RT PCR (HOSPITAL ORDER, [facility])  CULTURE, BLOOD (ROUTINE X 2)  CULTURE, BLOOD (ROUTINE X 2)  BRAIN NATRIURETIC PEPTIDE  LACTIC ACID, PLASMA   ____________________________________________  EKG  ED ECG REPORT I, Niaja Stickley, FNP-BC personally viewed and interpreted this ECG.   Date: 11/25/2018  EKG Time: 1539  Rate: 103  Rhythm: sinus tachycardia  Axis: Right  Intervals:none  ST&T Change: no ST elevation  ____________________________________________  RADIOLOGY  ED MD interpretation:    Printed report on CTA chest shows no evidence of pulmonary embolism. Extensive interstitial lung disease includes a prominent component of groundglass opacity, the latter most evident in the lower lobes.  Associated prominent mediastinal less prominent hilar adenopathy.  Mild underlying emphysema.  Chronic findings include three-vessel coronary artery calcifications and aortic atherosclerosis.  Plan to admit the patient for further work-up.  Official radiology report(s): Dg Chest Port 1 View  Result Date: 11/25/2018 CLINICAL DATA:  Shortness of breath starting 4 weeks ago, gradually worsening. Former smoker. EXAM: PORTABLE CHEST 1 VIEW COMPARISON:  None. FINDINGS: Borderline cardiomegaly. Patchy bilateral airspace opacities, predominantly peripheral. No pleural effusion or pneumothorax seen. Osseous structures about the chest are unremarkable. IMPRESSION: 1. Borderline cardiomegaly. 2. Bilateral airspace opacities, of uncertain chronicity. Findings could represent chronic  interstitial lung disease/fibrosis, acute interstitial edema or multifocal pneumonia. In the absence of fever, suspect some degree of acute edema superimposed on chronic interstitial lung disease/fibrosis. Electronically Signed   By: Franki Cabot M.D.   On: 11/25/2018 16:20    ____________________________________________   PROCEDURES  Procedure(s) performed (including Critical Care):  Procedures  ____________________________________________   INITIAL IMPRESSION / ASSESSMENT AND PLAN   73 year old male presenting to the emergency department in acute respiratory distress.  See HPI for  further details.  Patient was brought from triage straight to treatment room after O2 sat of 69% on room air.  Patient is not currently on home oxygen or have a history of any lung disease.  No known exposure to COVID-19.  DIFFERENTIAL DIAGNOSIS  PE, COVID-19, pulmonary edema, pneumonia, congestive heart failure, MI  ED COURSE  Patient on 15 L of oxygen via nonrebreather with less work of breathing.  Patient is able to say 3 word sentences.  Oxygen saturation is currently 97%.   ----------------------------------------- 4:27 PM on 11/25/2018 ----------------------------------------- cOVID screening is negative. ----------------------------------------- 6:33 PM on 11/25/2018 -----------------------------------------  Patient currently resting with nonrebreather in place.  Respirations are unlabored.  Continue till weight radiology reading of the CT angio chest.  Second lactic acid is noted to be 1.9 and second troponin is noted to be 25.   ----------------------------------------- 7:38 PM on 11/25/2018 -----------------------------------------  Printed report on CTA chest shows no evidence of pulmonary embolism. Extensive interstitial lung disease includes a prominent component of groundglass opacity, the latter most evident in the lower lobes.  Associated prominent mediastinal less prominent  hilar adenopathy.  Mild underlying emphysema.  Chronic findings include three-vessel coronary artery calcifications and aortic atherosclerosis.  Plan to admit the patient for further work-up.  He remains on 15 L of oxygen via nonrebreather.  With any attempt to decrease high flow O2, he desats into the high 80s, low 90s.   ----------------------------------------- 7:53 PM on 11/25/2018 -----------------------------------------  Patient to be admitted.    Gerald Clark was evaluated in Emergency Department on 11/25/2018 for the symptoms described in the history of present illness. He was evaluated in the context of the global COVID-19 pandemic, which necessitated consideration that the patient might be at risk for infection with the SARS-CoV-2 virus that causes COVID-19. Institutional protocols and algorithms that pertain to the evaluation of patients at risk for COVID-19 are in a state of rapid change based on information released by regulatory bodies including the CDC and federal and state organizations. These policies and algorithms were followed during the patient's care in the ED.  CRITICAL CARE Performed by: Sherrie George   Total critical care time: 60 minutes  Critical care time was exclusive of separately billable procedures and treating other patients.  Critical care was necessary to treat or prevent imminent or life-threatening deterioration.  Critical care was time spent personally by me on the following activities: development of treatment plan with patient and/or surrogate as well as nursing, discussions with consultants, evaluation of patient's response to treatment, examination of patient, obtaining history from patient or surrogate, ordering and performing treatments and interventions, ordering and review of laboratory studies, ordering and review of radiographic studies, pulse oximetry and re-evaluation of patient's condition.   ____________________________________________    FINAL CLINICAL IMPRESSION(S) / ED DIAGNOSES  Final diagnoses:  Acute respiratory distress  Hypoxia    ED Discharge Orders    None       Note:  This document was prepared using Dragon voice recognition software and may include unintentional dictation errors.   Victorino Dike, FNP 11/25/18 2003    Lilia Pro., MD 11/26/18 [phone]

## 2018-11-25 NOTE — ED Provider Notes (Signed)
.  Critical Care Performed by: Lilia Pro., MD Authorized by: Lilia Pro., MD   Critical care provider statement:    Critical care time (minutes):  30   Critical care was necessary to treat or prevent imminent or life-threatening deterioration of the following conditions:  Respiratory failure   Critical care was time spent personally by me on the following activities:  Discussions with consultants, evaluation of patient's response to treatment, examination of patient, ordering and performing treatments and interventions, ordering and review of laboratory studies, ordering and review of radiographic studies, pulse oximetry, re-evaluation of patient's condition, obtaining history from patient or surrogate and review of old charts      Lilia Pro., MD 11/25/18 2243

## 2018-11-25 NOTE — ED Notes (Signed)
Pt sitting on side of bed. Pt appears steady.

## 2018-11-25 NOTE — ED Notes (Signed)
Pt up to use urinal at bedside, non rebreather on at 10 L per EDP [place], pt O2 sat dropped to 88%. Pt returned to bed, sats remained at 88%, O2 turned up to 15L. O2 sats increased to 98%. Pt remains on nonrebreather at 15L

## 2018-11-25 NOTE — Progress Notes (Signed)
PHARMACIST - PHYSICIAN ORDER COMMUNICATION  CONCERNING: P&T Medication Policy on Herbal Medications  DESCRIPTION:  This patient's order for:  CINNAMON  has been noted.  This product(s) is classified as an "herbal" or natural product. Due to a lack of definitive safety studies or FDA approval, nonstandard manufacturing practices, plus the potential risk of unknown drug-drug interactions while on inpatient medications, the Pharmacy and Therapeutics Committee does not permit the use of "herbal" or natural products of this type within [facility].   ACTION TAKEN: The pharmacy department is unable to verify this order at this time.   Please reevaluate patient's clinical condition at discharge and address if the herbal or natural product(s) should be resumed at that time.

## 2018-11-25 NOTE — ED Notes (Signed)
bg [phone]

## 2018-11-25 NOTE — ED Notes (Signed)
Pt states he feels comfortable at present, pt states he does not feel SOB or SOB when talking.

## 2018-11-25 NOTE — H&P (Addendum)
at [place] NAME: Gerald Clark    MR#:  [id number]  DATE OF BIRTH:  March 16, 1945  DATE OF ADMISSION:  11/25/2018  PRIMARY CARE PHYSICIAN: Adin Hector, MD   REQUESTING/REFERRING PHYSICIAN: Victorino Dike, FNP CHIEF COMPLAINT:   Chief Complaint  Patient presents with   Shortness of Breath    HISTORY OF PRESENT ILLNESS:  Gerald Clark  is a 73 y.o. Caucasian male with a known history of diabetes mellitus, hypertension, dyslipidemia peripheral vascular disease, who presented to the emergency room with acute onset of worsening dyspnea over the last 4 weeks.  This has been significantly worse today.  He denied any cough or wheezing.  He admitted to chills but denied any measured fever at home.  His temperature here initially was 98.5.  He denied any nausea or vomiting or diarrhea.  No loss of taste or smell.  No recent sick exposure to COVID-19.  He denies any urinary frequency urgency, dysuria or hematuria or flank pain.  No chest pain or palpitations.  He was having left thigh pain that was evaluated by his primary care physician and apparently had an LS spine that showed arthritic changes.  Upon presentation to the emergency room, blood pressure was 184/62 with a pulse of 126 and respiratory to 34 with oximetry of 72% on room air.  This is come up to 98-100% on her percent nonrebreather.  Labs revealed mild hyponatremia 132 and hypokalemia of 34 with hypochloremia of 95.  His BNP was 84 high-sensitivity troponin I was 23 and later 25.  Lactic acid was 2.9 and later 1.9 and CBC showed leukocytosis of 16.4 and neutrophilia.  His neutrophils were 13 and lymphocytes 1.6.  His fibrin directed with D-dimer came back elevated at 534.  The patient had a chest CTA that showed no evidence for PE but did show extensive findings of interstitial lung disease including a prominent component of groundglass opacity most evident lower lobes with associated prominent  mediastinal and less prominent hilar lymphadenopathy and mild underlying emphysema.  It also showed chronic findings including three-vessel coronary artery calcifications and aortic atherosclerosis.  His COVID-19 test came back negative  The patient was given 80 mg of IV Solu-Medrol as well as 1 L bolus of IV normal saline.  He will be admitted to a medically monitored bed for further evaluation and management. PAST MEDICAL HISTORY:   Past Medical History:  Diagnosis Date   Arthritis    Asthma    childhood asthma   Cancer ([place])    Basal Cell Skin Cancer   Diabetes mellitus without complication (HCC)    GERD (gastroesophageal reflux disease)    Glaucoma    History of chicken pox    Hyperlipemia    Hypertension    Peripheral vascular disease ([place])     PAST SURGICAL HISTORY:   Past Surgical History:  Procedure Laterality Date   COLONOSCOPY N/A 06/02/2014   Procedure: COLONOSCOPY;  Surgeon: Josefine Class, MD;  Location: [facility]COPY;  Service: Endoscopy;  Laterality: N/A;   INGUINAL HERNIA REPAIR Left 06/28/2016   Procedure: HERNIA REPAIR INGUINAL ADULT WITH MESH;  Surgeon: Leonie Green, MD;  Location: [facility] ORS;  Service: General;  Laterality: Left;   LOWER EXTREMITY ANGIOGRAPHY Right 10/24/2016   Procedure: Lower Extremity Angiography;  Surgeon: Algernon Huxley, MD;  Location: [facility];  Service: Cardiovascular;  Laterality: Right;    SOCIAL HISTORY:   Social History  Tobacco Use   Smoking status: Former Smoker    Packs/day: 2.00    Types: Cigarettes    Quit date: 04/25/2011    Years since quitting: 7.5   Smokeless tobacco: Never Used  Substance Use Topics   Alcohol use: Yes    Alcohol/week: 5.0 standard drinks    Types: 5 Cans of beer per week    Comment: occas    FAMILY HISTORY:   Family History  Problem Relation Age of Onset   Hypertension Mother    Hypertension Father    Cancer Father    Breast cancer Sister    Cancer  Sister     DRUG ALLERGIES:  No Known Allergies  REVIEW OF SYSTEMS:   ROS As per history of present illness. All pertinent systems were reviewed above. Constitutional,  HEENT, cardiovascular, respiratory, GI, GU, musculoskeletal, neuro, psychiatric, endocrine,  integumentary and hematologic systems were reviewed and are otherwise  negative/unremarkable except for positive findings mentioned above in the HPI.   MEDICATIONS AT HOME:   Prior to Admission medications   Medication Sig Start Date End Date Taking? Authorizing Provider  aspirin 81 MG tablet Take 81 mg by mouth daily.    [provider]  brimonidine (ALPHAGAN) 0.2 % ophthalmic solution Place 1 drop into both eyes 2 (two) times daily.    [provider]  Cinnamon 500 MG capsule Take 500 mg by mouth daily.    [provider]  clopidogrel (PLAVIX) 75 MG tablet TAKE 1 TABLET BY MOUTH EVERY DAY 10/11/17   Algernon Huxley, MD  dorzolamide (TRUSOPT) 2 % ophthalmic solution Place 1 drop into both eyes 2 (two) times daily.    [provider]  empagliflozin (JARDIANCE) 10 MG TABS tablet Take 10 mg by mouth daily.    [provider]  glipiZIDE (GLUCOTROL) 10 MG tablet Take 10 mg by mouth 2 (two) times daily before a meal.     [provider]  hydrochlorothiazide (HYDRODIURIL) 25 MG tablet Take 25 mg by mouth daily.    [provider]  latanoprost (XALATAN) 0.005 % ophthalmic solution Place 1 drop into both eyes at bedtime.    [provider]  lisinopril (PRINIVIL,ZESTRIL) 40 MG tablet Take 40 mg by mouth daily.     [provider]  Magnesium Oxide 250 MG TABS Take 250 mg by mouth daily.    [provider]  metFORMIN (GLUCOPHAGE) 1000 MG tablet Take 1,000 mg by mouth 2 (two) times daily with a meal.    [provider]  niacin 500 MG CR capsule Take 500 mg by mouth at bedtime.     [provider]  pioglitazone (ACTOS) 15 MG tablet TAKE 1  TABLET (15 MG TOTAL) BY MOUTH ONCE DAILY. 06/01/16   [provider]  Polyethyl Glycol-Propyl Glycol (SYSTANE ULTRA OP) Apply 1 drop to eye 3 (three) times daily as needed (Dry eyes).    [provider]  Potassium 99 MG TABS Take 99 mg by mouth daily.    [provider]  simvastatin (ZOCOR) 40 MG tablet Take 20 mg by mouth daily at 6 PM. TAKES 0.5 TABLET    [provider]  timolol (TIMOPTIC) 0.5 % ophthalmic solution Place 1 drop into both eyes 2 (two) times daily.    [provider]      VITAL SIGNS:  Blood pressure (!) 147/78, pulse 94, temperature 98.5 F (36.9 C), temperature source Oral, resp. rate (!) 28, height 5\' 6"  (1.676 m),  weight 72.6 kg, SpO2 100 %.  PHYSICAL EXAMINATION:  Physical Exam  GENERAL:  73 y.o.-year-old Caucasian male patient lying in the bed with mild respiratory distress with conversational dyspnea on her percent nonrebreather EYES: Pupils equal, round, reactive to light and accommodation. No scleral icterus. Extraocular muscles intact.  HEENT: Head atraumatic, normocephalic. Oropharynx and nasopharynx clear.  NECK:  Supple, no jugular venous distention. No thyroid enlargement, no tenderness.  LUNGS: Diminished bibasilar breath sounds with bibasal crackles. CARDIOVASCULAR: Regular rate and rhythm, S1, S2 normal. No murmurs, rubs, or gallops.  ABDOMEN: Soft, nondistended, nontender. Bowel sounds present. No organomegaly or mass.  EXTREMITIES: No pedal edema, cyanosis, or clubbing.  NEUROLOGIC: Cranial nerves II through XII are intact. Muscle strength 5/5 in all extremities. Sensation intact. Gait not checked.  PSYCHIATRIC: The patient is alert and oriented x 3.  Normal affect and good eye contact. SKIN: No obvious rash, lesion, or ulcer.   LABORATORY PANEL:   CBC Recent Labs  Lab 11/25/18 1527  WBC 16.4*  HGB 14.4  HCT 44.1  PLT 416*    ------------------------------------------------------------------------------------------------------------------  Chemistries  Recent Labs  Lab 11/25/18 1527  NA 132*  K 3.4*  CL 95*  CO2 24  GLUCOSE 94  BUN 17  CREATININE 1.03  CALCIUM 9.9   ------------------------------------------------------------------------------------------------------------------  Cardiac Enzymes No results for input(s): TROPONINI in the last 168 hours. ------------------------------------------------------------------------------------------------------------------  RADIOLOGY:  Dg Chest Port 1 View  Result Date: 11/25/2018 CLINICAL DATA:  Shortness of breath starting 4 weeks ago, gradually worsening. Former smoker. EXAM: PORTABLE CHEST 1 VIEW COMPARISON:  None. FINDINGS: Borderline cardiomegaly. Patchy bilateral airspace opacities, predominantly peripheral. No pleural effusion or pneumothorax seen. Osseous structures about the chest are unremarkable. IMPRESSION: 1. Borderline cardiomegaly. 2. Bilateral airspace opacities, of uncertain chronicity. Findings could represent chronic interstitial lung disease/fibrosis, acute interstitial edema or multifocal pneumonia. In the absence of fever, suspect some degree of acute edema superimposed on chronic interstitial lung disease/fibrosis. Electronically Signed   By: Franki Cabot M.D.   On: 11/25/2018 16:20      IMPRESSION AND PLAN:   1.  Acute hypoxic respiratory failure in the setting of bibasal groundglass densities on chest CTA and underlying emphysema.  The patient will be admitted to medical monitored bed.  O2 protocol will be followed.  Will empirically place the patient on IV steroids with Decadron for the possibility of interstitial lung disease.  Differential diagnosis would include COVID-19 despite negative test specially given elevated D-dimer.  I added LDH, serum ferritin and CRP.  I will therefore place the patient for now on IV heparin bolus and  drip and will repeat COVID-19 test tomorrow.  We will place the patient for now on isolation.  Will empirically start the patient on IV Rocephin and Zithromax specially given associated leukocytosis and possibility of community-acquired pneumonia.  We will add scheduled duo nebs.  2.  Hypokalemia.  Potassium will be replaced and magnesium level will be checked.  3.  Hyponatremia.  This could be related to mild volume depletion.  We will gently hydrate with IV normal saline and follow BMP.  4.  Type 2 diabetes mellitus.  The patient will be placed on supplement coverage with NovoLog, resume Actos, Glucotrol and will hold off Metformin.  5.  Hypertension.  The patient's blood pressure was initially elevated and later on came down to 129/74.  We will continue amlodipine, HCTZ and Zestril  6.  Glaucoma.  We will continue ophthalmic GTT.  7.  DVT prophylaxis.  The patient will be on IV heparin for now.  All the records are reviewed and case discussed with ED provider. The plan of care was discussed in details with the patient (and family). I answered all questions. The patient agreed to proceed with the above mentioned plan. Further management will depend upon hospital course.   CODE STATUS: Full code  TOTAL TIME TAKING CARE OF THIS PATIENT: 55 minutes.    Christel Mormon M.D on 11/25/2018 at 8:27 PM  [facility]   From 7 PM-7 AM, contact night-coverage [email or web address]  CC: Primary care physician; Adin Hector, MD   Note: This dictation was prepared with Dragon dictation along with smaller phrase technology. Any transcriptional errors that result from this process are unintentional.

## 2018-11-25 NOTE — ED Notes (Signed)
Date and time results received: 11/25/18 1606 (use smartphrase ".now" to insert current time)  Test: Lactic Critical Value: 2.9  Name of Provider Notified: Dr. Joan Mayans  Orders Received? Or Actions Taken?: Actions Taken: NA

## 2018-11-25 NOTE — ED Notes (Signed)
EDP in room with [place] machine

## 2018-11-26 ENCOUNTER — Encounter: Payer: Self-pay | Admitting: Internal Medicine

## 2018-11-26 DIAGNOSIS — E876 Hypokalemia: Secondary | ICD-10-CM | POA: Insufficient documentation

## 2018-11-26 DIAGNOSIS — J9621 Acute and chronic respiratory failure with hypoxia: Secondary | ICD-10-CM | POA: Insufficient documentation

## 2018-11-26 DIAGNOSIS — E1165 Type 2 diabetes mellitus with hyperglycemia: Secondary | ICD-10-CM

## 2018-11-26 DIAGNOSIS — J189 Pneumonia, unspecified organism: Secondary | ICD-10-CM | POA: Diagnosis present

## 2018-11-26 LAB — CBC
HCT: 41.2 % (ref 39.0–52.0)
Hemoglobin: 13.1 g/dL (ref 13.0–17.0)
MCH: 27.6 pg (ref 26.0–34.0)
MCHC: 31.8 g/dL (ref 30.0–36.0)
MCV: 86.9 fL (ref 80.0–100.0)
Platelets: 433 10*3/uL — ABNORMAL HIGH (ref 150–400)
RBC: 4.74 MIL/uL (ref 4.22–5.81)
RDW: 14.8 % (ref 11.5–15.5)
WBC: 25.2 10*3/uL — ABNORMAL HIGH (ref 4.0–10.5)
nRBC: 0 % (ref 0.0–0.2)

## 2018-11-26 LAB — COMPREHENSIVE METABOLIC PANEL
ALT: 18 U/L (ref 0–44)
AST: 38 U/L (ref 15–41)
Albumin: 2.9 g/dL — ABNORMAL LOW (ref 3.5–5.0)
Alkaline Phosphatase: 59 U/L (ref 38–126)
Anion gap: 15 (ref 5–15)
BUN: 23 mg/dL (ref 8–23)
CO2: 21 mmol/L — ABNORMAL LOW (ref 22–32)
Calcium: 9.3 mg/dL (ref 8.9–10.3)
Chloride: 98 mmol/L (ref 98–111)
Creatinine, Ser: 0.89 mg/dL (ref 0.61–1.24)
GFR calc Af Amer: >60 mL/min (ref >60)
GFR calc non Af Amer: >60 mL/min (ref >60)
Glucose, Bld: 206 mg/dL — ABNORMAL HIGH (ref 70–99)
Potassium: 4 mmol/L (ref 3.5–5.1)
Sodium: 134 mmol/L — ABNORMAL LOW (ref 135–145)
Total Bilirubin: 1.4 mg/dL — ABNORMAL HIGH (ref 0.3–1.2)
Total Protein: 8.2 g/dL — ABNORMAL HIGH (ref 6.5–8.1)

## 2018-11-26 LAB — BLOOD GAS, ARTERIAL
Acid-Base Excess: 4.4 mmol/L — ABNORMAL HIGH (ref 0.0–2.0)
Bicarbonate: 26.7 mmol/L (ref 20.0–28.0)
FIO2: 1
O2 Saturation: 95.1 %
Patient temperature: 37
pCO2 arterial: 32 mmHg (ref 32.0–48.0)
pH, Arterial: 7.53 — ABNORMAL HIGH (ref 7.350–7.450)
pO2, Arterial: 67 mmHg — ABNORMAL LOW (ref 83.0–108.0)

## 2018-11-26 LAB — SARS CORONAVIRUS 2 BY RT PCR (HOSPITAL ORDER, PERFORMED IN ~~LOC~~ HOSPITAL LAB): SARS Coronavirus 2: NEGATIVE

## 2018-11-26 LAB — GLUCOSE, CAPILLARY
Glucose-Capillary: 155 mg/dL — ABNORMAL HIGH (ref 70–99)
Glucose-Capillary: 188 mg/dL — ABNORMAL HIGH (ref 70–99)
Glucose-Capillary: 132 mg/dL — ABNORMAL HIGH (ref 70–99)

## 2018-11-26 LAB — STREP PNEUMONIAE URINARY ANTIGEN: Strep Pneumo Urinary Antigen: NEGATIVE

## 2018-11-26 LAB — HEMOGLOBIN A1C
Hgb A1c MFr Bld: 6.2 % — ABNORMAL HIGH (ref 4.8–5.6)
Mean Plasma Glucose: 131.24 mg/dL

## 2018-11-26 LAB — C-REACTIVE PROTEIN: CRP: 19 mg/dL — ABNORMAL HIGH (ref <1.0)

## 2018-11-26 LAB — HIV ANTIBODY (ROUTINE TESTING W REFLEX): HIV Screen 4th Generation wRfx: NONREACTIVE

## 2018-11-26 LAB — PROCALCITONIN: Procalcitonin: 0.11 ng/mL

## 2018-11-26 MED ORDER — ENOXAPARIN SODIUM 40 MG/0.4ML ~~LOC~~ SOLN
40.0000 mg | SUBCUTANEOUS | Status: DC
  Administered 2018-11-26 – 2018-12-03 (×8): 40 mg via SUBCUTANEOUS
  Filled 2018-11-26 (×8): qty 0.4

## 2018-11-26 MED ORDER — METHYLPREDNISOLONE SODIUM SUCC 125 MG IJ SOLR
80.0000 mg | Freq: Two times a day (BID) | INTRAMUSCULAR | Status: DC
  Administered 2018-11-26 – 2018-11-28 (×4): 80 mg via INTRAVENOUS
  Filled 2018-11-26 (×4): qty 2

## 2018-11-26 MED ORDER — FUROSEMIDE 10 MG/ML IJ SOLN
40.0000 mg | Freq: Two times a day (BID) | INTRAMUSCULAR | Status: DC
  Administered 2018-11-26 – 2018-12-01 (×10): 40 mg via INTRAVENOUS
  Filled 2018-11-26 (×10): qty 4

## 2018-11-26 MED ORDER — FUROSEMIDE 10 MG/ML IJ SOLN
INTRAMUSCULAR | Status: AC
  Administered 2018-11-26: 40 mg via INTRAVENOUS
  Filled 2018-11-26: qty 4

## 2018-11-26 MED ORDER — LEVOFLOXACIN 750 MG PO TABS
750.0000 mg | ORAL_TABLET | Freq: Every day | ORAL | Status: DC
  Administered 2018-11-26 – 2018-11-30 (×5): 750 mg via ORAL
  Filled 2018-11-26: qty 2
  Filled 2018-11-26 (×4): qty 1
  Filled 2018-11-26: qty 2

## 2018-11-26 NOTE — ED Notes (Signed)
Report given to receiving nurse Acia Rn, patient to unit.

## 2018-11-26 NOTE — ED Notes (Signed)
Pt with non c/o pain. Tolerating non rebreather mask. Pt with good appetite. Tolerating po fluids well.

## 2018-11-26 NOTE — Consult Note (Signed)
Pulmonary Medicine          Date: 11/26/2018,   MRN# [id number] Gerald Clark June 28, 1945     AdmissionWeight: 72.6 kg                 CurrentWeight: 72.6 kg      CHIEF COMPLAINT:   Acute hypoxemic respiratory failure   HISTORY OF PRESENT ILLNESS   This is a patient with moderate persistent asthma from childhood, osteoarthritis, GERD, basal cell skin CA, dyslipidemia essential hypertension peripheral vascular disease, was seen in the emergency room due to worsening hypoxemia over the last several weeks with persistent wheezing audible without auscultation he denies any sick contacts he was noted to be acutely hypoxemic on room air at 72% tachypneic and tachycardic which improved with supplemental oxygen troponin and BNP was mildly elevated lactate was 2.9 he did have leukocytosis at 16.4 he had CT PE done which showed interstitial opacification groundglass opacities as well as hilar and mediastinal lymphadenopathy, he had COVID-19 testing which was negative.  Patient was admitted to hospitalist service and pulmonary consultation was placed by Dr Mal Misty for additional evaluation and management of acutely hypoxemic respiratory failure. He has lost appx 26 lbs unintetionally over last 5 weeks. He stopped smoking in 2004, previous lifelong smoker 1ppd from age 58.       PAST MEDICAL HISTORY   Past Medical History:  Diagnosis Date   Arthritis    Asthma    childhood asthma   Cancer ([place])    Basal Cell Skin Cancer   Diabetes mellitus without complication (HCC)    GERD (gastroesophageal reflux disease)    Glaucoma    History of chicken pox    Hyperlipemia    Hypertension    Peripheral vascular disease ([place])      SURGICAL HISTORY   Past Surgical History:  Procedure Laterality Date   COLONOSCOPY N/A 06/02/2014   Procedure: COLONOSCOPY;  Surgeon: Josefine Class, MD;  Location: [facility] ENDOSCOPY;  Service: Endoscopy;  Laterality: N/A;   INGUINAL HERNIA  REPAIR Left 06/28/2016   Procedure: HERNIA REPAIR INGUINAL ADULT WITH MESH;  Surgeon: Leonie Green, MD;  Location: [facility] ORS;  Service: General;  Laterality: Left;   LOWER EXTREMITY ANGIOGRAPHY Right 10/24/2016   Procedure: Lower Extremity Angiography;  Surgeon: Algernon Huxley, MD;  Location: [place] [facility] LAB;  Service: Cardiovascular;  Laterality: Right;     FAMILY HISTORY   Family History  Problem Relation Age of Onset   Hypertension Mother    Hypertension Father    Cancer Father    Breast cancer Sister    Cancer Sister      SOCIAL HISTORY   Social History   Tobacco Use   Smoking status: Former Smoker    Packs/day: 2.00    Types: Cigarettes    Quit date: 04/25/2011    Years since quitting: 7.5   Smokeless tobacco: Never Used  Substance Use Topics   Alcohol use: Yes    Alcohol/week: 5.0 standard drinks    Types: 5 Cans of beer per week    Comment: occas   Drug use: No     MEDICATIONS    Home Medication:  Current Outpatient Rx   Order #: [id number] Class: Historical Med   Order #: [id number] Class: Historical Med   Order #: [id number] Class: Historical Med   Order #: [id number] Class: Normal   Order #: [id number] Class: Historical Med   Order #: [id number] Class: Historical Med   Order #: [id number] Class: Historical  Med   Order #: [id number] Class: Historical Med   Order #: [id number] Class: Historical Med   Order #: [id number] Class: Historical Med   Order #: [id number] Class: Historical Med   Order #: [id number] Class: Historical Med   Order #: [id number] Class: Historical Med   Order #: [id number] Class: Historical Med   Order #: [id number] Class: Historical Med   Order #: [id number] Class: Historical Med   Order #: [id number] Class: Historical Med   Order #: [id number] Class: Historical Med   Order #: [id number] Class: Historical Med   Order #: [id number] Class: Historical Med    Current Medication:  Current Facility-Administered Medications:    0.9  %  sodium chloride infusion, , Intravenous, Continuous, Mansy, Jan A, MD, Last Rate: 75 mL/hr at 11/25/18 2349   acetaminophen (TYLENOL) tablet 650 mg, 650 mg, Oral, Q6H PRN **OR** acetaminophen (TYLENOL) suppository 650 mg, 650 mg, Rectal, Q6H PRN, Mansy, Jan A, MD   aspirin EC tablet 81 mg, 81 mg, Oral, Daily, Mansy, Jan A, MD, 81 mg at 11/26/18 0926   azithromycin (ZITHROMAX) 500 mg in sodium chloride 0.9 % 250 mL IVPB, 500 mg, Intravenous, Q24H, Mansy, Jan A, MD, Last Rate: 250 mL/hr at 11/26/18 0123, 500 mg at 11/26/18 0123   brimonidine (ALPHAGAN) 0.2 % ophthalmic solution 1 drop, 1 drop, Both Eyes, BID, Mansy, Jan A, MD, 1 drop at 11/26/18 1059   cefTRIAXone (ROCEPHIN) 2 g in sodium chloride 0.9 % 100 mL IVPB, 2 g, Intravenous, Q24H, Mansy, Jan A, MD, Stopped at 11/26/18 0102   clopidogrel (PLAVIX) tablet 75 mg, 75 mg, Oral, Daily, Mansy, Jan A, MD, 75 mg at 11/26/18 [id number]   dexamethasone (DECADRON) injection 6 mg, 6 mg, Intravenous, Q24H, Mansy, Jan A, MD, 6 mg at 11/26/18 0001   dorzolamide (TRUSOPT) 2 % ophthalmic solution 1 drop, 1 drop, Both Eyes, BID, Mansy, Jan A, MD, 1 drop at 11/26/18 1103   enoxaparin (LOVENOX) injection 40 mg, 40 mg, Subcutaneous, Q24H, Jennye Boroughs, MD   hydrochlorothiazide (HYDRODIURIL) tablet 25 mg, 25 mg, Oral, Daily, Mansy, Jan A, MD, 25 mg at 11/26/18 [id number]   insulin aspart (novoLOG) injection 0-9 Units, 0-9 Units, Subcutaneous, TID WC, Mansy, Jan A, MD, 2 Units at 11/26/18 1140   ipratropium-albuterol (DUONEB) 0.5-2.5 (3) MG/3ML nebulizer solution 3 mL, 3 mL, Nebulization, QID, Mansy, Jan A, MD, 3 mL at 11/26/18 1142   latanoprost (XALATAN) 0.005 % ophthalmic solution 1 drop, 1 drop, Both Eyes, QHS, Mansy, Jan A, MD   lisinopril (ZESTRIL) tablet 40 mg, 40 mg, Oral, Daily, Mansy, Jan A, MD, 40 mg at 11/26/18 [id number]   magnesium hydroxide (MILK OF MAGNESIA) suspension 30 mL, 30 mL, Oral, Daily PRN, Mansy, Jan A, MD   magnesium oxide (MAG-OX) tablet  400 mg, 400 mg, Oral, Daily, Mansy, Jan A, MD, 400 mg at 11/26/18 [id number]   niacin (SLO-NIACIN) CR tablet 500 mg, 500 mg, Oral, QHS, Mansy, Jan A, MD   ondansetron (ZOFRAN) tablet 4 mg, 4 mg, Oral, Q6H PRN **OR** ondansetron (ZOFRAN) injection 4 mg, 4 mg, Intravenous, Q6H PRN, Mansy, Jan A, MD   polyvinyl alcohol (LIQUIFILM TEARS) 1.4 % ophthalmic solution 1 drop, 1 drop, Both Eyes, TID PRN, Mansy, Jan A, MD   potassium chloride (KLOR-CON) packet 40 mEq, 40 mEq, Oral, Once, Mansy, Jan A, MD   Potassium TABS 99 mg, 99 mg, Oral, Daily, Mansy, Jan A, MD   timolol (TIMOPTIC) 0.5 % ophthalmic solution 1 drop, 1 drop, Both Eyes, BID, Mansy, Jan A, MD, 1 drop at 11/26/18 1108  traZODone (DESYREL) tablet 25 mg, 25 mg, Oral, QHS PRN, Mansy, Jan A, MD  Current Outpatient Medications:    aspirin 81 MG tablet, Take 81 mg by mouth daily., Disp: , Rfl:    brimonidine (ALPHAGAN) 0.2 % ophthalmic solution, Place 1 drop into both eyes 2 (two) times daily., Disp: , Rfl:    Cinnamon 500 MG capsule, Take 500 mg by mouth daily., Disp: , Rfl:    clopidogrel (PLAVIX) 75 MG tablet, TAKE 1 TABLET BY MOUTH EVERY DAY, Disp: 90 tablet, Rfl: 3   dorzolamide (TRUSOPT) 2 % ophthalmic solution, Place 1 drop into both eyes 2 (two) times daily., Disp: , Rfl:    gabapentin (NEURONTIN) 100 MG capsule, Take 100 mg by mouth every morning., Disp: , Rfl:    glipiZIDE (GLUCOTROL) 10 MG tablet, Take 10 mg by mouth 2 (two) times daily before a meal. , Disp: , Rfl:    hydrochlorothiazide (HYDRODIURIL) 25 MG tablet, Take 25 mg by mouth daily., Disp: , Rfl:    JARDIANCE 25 MG TABS tablet, Take 25 mg by mouth daily., Disp: , Rfl:    lisinopril (PRINIVIL,ZESTRIL) 40 MG tablet, Take 40 mg by mouth daily. , Disp: , Rfl:    Magnesium Oxide 250 MG TABS, Take 250 mg by mouth daily., Disp: , Rfl:    metFORMIN (GLUCOPHAGE) 1000 MG tablet, Take 1,000 mg by mouth 2 (two) times daily with a meal., Disp: , Rfl:    pioglitazone (ACTOS)  15 MG tablet, TAKE 1 TABLET (15 MG TOTAL) BY MOUTH ONCE DAILY., Disp: , Rfl: 5   Potassium 99 MG TABS, Take 99 mg by mouth daily., Disp: , Rfl:    timolol (TIMOPTIC) 0.5 % ophthalmic solution, Place 1 drop into both eyes 2 (two) times daily., Disp: , Rfl:    amLODipine (NORVASC) 5 MG tablet, Take 5 mg by mouth daily., Disp: , Rfl:    latanoprost (XALATAN) 0.005 % ophthalmic solution, Place 1 drop into both eyes at bedtime., Disp: , Rfl:    niacin 500 MG CR capsule, Take 500 mg by mouth at bedtime. , Disp: , Rfl:    Polyethyl Glycol-Propyl Glycol (SYSTANE ULTRA OP), Apply 1 drop to eye 3 (three) times daily as needed (Dry eyes)., Disp: , Rfl:    simvastatin (ZOCOR) 40 MG tablet, Take 20 mg by mouth daily at 6 PM. TAKES 0.5 TABLET, Disp: , Rfl:     ALLERGIES   Patient has no known allergies.     REVIEW OF SYSTEMS    Review of Systems:  Gen:  Denies  fever, sweats, chills weigh loss  HEENT: Denies blurred vision, double vision, ear pain, eye pain, hearing loss, nose bleeds, sore throat Cardiac:  No dizziness, chest pain or heaviness, chest tightness,edema Resp:   Denies cough or sputum porduction, shortness of breath,wheezing, hemoptysis,  Gi: Denies swallowing difficulty, stomach pain, nausea or vomiting, diarrhea, constipation, bowel incontinence Gu:  Denies bladder incontinence, burning urine Ext:   Denies Joint pain, stiffness or swelling Skin: Denies  skin rash, easy bruising or bleeding or hives Endoc:  Denies polyuria, polydipsia , polyphagia or weight change Psych:   Denies depression, insomnia or hallucinations   Other:  All other systems negative   VS: BP (!) 151/75    Pulse (!) 112    Temp 98.3 F (36.8 C) (Oral)    Resp (!) 37    Ht 5\' 6"  (1.676 m)    Wt 72.6 kg    SpO2 (!) 68%  BMI 25.82 kg/m      PHYSICAL EXAM    GENERAL:NAD, no fevers, chills, no weakness no fatigue HEAD: Normocephalic, atraumatic.  EYES: Pupils equal, round, reactive to light.  Extraocular muscles intact. No scleral icterus.  MOUTH: Moist mucosal membrane. Dentition intact. No abscess noted.  EAR, NOSE, THROAT: Clear without exudates. No external lesions.  NECK: Supple. No thyromegaly. No nodules. No JVD.  PULMONARY: Diffuse coarse rhonchi right sided +wheezes CARDIOVASCULAR: S1 and S2. Regular rate and rhythm. No murmurs, rubs, or gallops. No edema. Pedal pulses 2+ bilaterally.  GASTROINTESTINAL: Soft, nontender, nondistended. No masses. Positive bowel sounds. No hepatosplenomegaly.  MUSCULOSKELETAL: No swelling, clubbing, or edema. Range of motion full in all extremities.  NEUROLOGIC: Cranial nerves II through XII are intact. No gross focal neurological deficits. Sensation intact. Reflexes intact.  SKIN: No ulceration, lesions, rashes, or cyanosis. Skin warm and dry. Turgor intact.  PSYCHIATRIC: Mood, affect within normal limits. The patient is awake, alert and oriented x 3. Insight, judgment intact.       IMAGING    Ct Angio Chest Pe W And/or Wo Contrast  Result Date: 11/25/2018 CLINICAL DATA:  Shortness of breath beginning 4 weeks ago, gradually worsening. EXAM: CT ANGIOGRAPHY CHEST WITH CONTRAST TECHNIQUE: Multidetector CT imaging of the chest was performed using the standard protocol during bolus administration of intravenous contrast. Multiplanar CT image reconstructions and MIPs were obtained to evaluate the vascular anatomy. CONTRAST:  54mL OMNIPAQUE IOHEXOL 350 MG/ML SOLN COMPARISON:  Current chest radiograph. FINDINGS: Cardiovascular: There is satisfactory opacification of the pulmonary arteries to the segmental level. There is no evidence of a pulmonary embolism. Heart is normal in size. Three-vessel coronary artery calcifications. No pericardial effusion. Great vessels normal in caliber. No aortic dissection. Aortic atherosclerosis. Mediastinum/Nodes: 9 mm hypoattenuating inferior right thyroid lobe nodule. No neck base or axillary masses or enlarged lymph  nodes. Mediastinal adenopathy. Several reference measurements are made. 17 mm short axis prevascular node. Azygos level right paratracheal node measuring 17 mm in short axis. More superior right paratracheal node measuring 14 mm in short axis subcarinal node measuring 2.1 cm in short axis. Mildly enlarged hilar nodes, 1.6 cm short axis superior right hilar node and a 1.5 cm mid left hilar and 1.8 cm left infrahilar nodes. Trachea and esophagus are unremarkable.  Small hiatal hernia. Lungs/Pleura: Lungs demonstrate coarse heterogeneous interstitial thickening, areas of interspersed cystic change, architectural distortion and intervening areas of ground-glass opacity, the latter most prominent in the lower lobes. Cystic change appears to be underlying paraseptal and mild centrilobular emphysema. Minimal bronchiectasis. No lung mass or suspicious nodule. No pleural effusion or pneumothorax. Upper Abdomen: No acute findings. Prominent gastrohepatic ligament lymph nodes, largest 1 point 2 cm in short axis. Musculoskeletal: No fracture or acute finding. No osteoblastic or osteolytic lesions. Review of the MIP images confirms the above findings. IMPRESSION: 1. No evidence of a pulmonary embolism. 2. Extensive findings of interstitial lung disease include a prominent component of ground-glass opacity, the latter most evident in the lower lobes. There is associated prominent mediastinal and less prominent hilar adenopathy. Mild underlying emphysema. 3. Chronic findings include three-vessel coronary artery calcifications and aortic atherosclerosis. Aortic Atherosclerosis (ICD10-I70.0) and Emphysema (ICD10-J43.9). Electronically Signed   By: Lajean Manes M.D.   On: 11/25/2018 18:24   Dg Chest Port 1 View  Result Date: 11/25/2018 CLINICAL DATA:  Shortness of breath starting 4 weeks ago, gradually worsening. Former smoker. EXAM: PORTABLE CHEST 1 VIEW COMPARISON:  None. FINDINGS: Borderline cardiomegaly. Patchy bilateral  airspace opacities, predominantly peripheral. No pleural effusion or pneumothorax seen. Osseous structures about the chest are unremarkable. IMPRESSION: 1. Borderline cardiomegaly. 2. Bilateral airspace opacities, of uncertain chronicity. Findings could represent chronic interstitial lung disease/fibrosis, acute interstitial edema or multifocal pneumonia. In the absence of fever, suspect some degree of acute edema superimposed on chronic interstitial lung disease/fibrosis. Electronically Signed   By: Franki Cabot M.D.   On: 11/25/2018 16:20      ASSESSMENT/PLAN   Acute hypoxemic respiratory faiure  -patient appears to have interstitial lung disease with exacerbation complicated by Asthma and COPD overlap syndrome.   - will perform respiratory viral panel stat  - lasix 80mg  X1 followed by 40 bid  - Solumedrol 80mg  BID 1st dose now  - patient is DNR/DNI , was saturating 76% on HHFNC during my evaluation, he is a mouth breather when placing nasal canula in mouth SpO2 improved to 84%  -Pulmicort neublizer therapy   - DuoNEB - caution as patient is severely tachycardic   - empiric antibiotics  - decrease any unnecessary infusions - will switch from IV rocephin and zithromax to PO Levoquin 0000000 daily  - Procalictonin trend - blood cultures X2  -Arterial blood gas -may send to SDU/MICU for further management due to severe hypoxemia.       Thank you for allowing me to participate in the care of this patient.    Patient/Family are satisfied with care plan and all questions have been answered.  This document was prepared using Dragon voice recognition software and may include unintentional dictation errors.     Ottie Glazier, M.D.  Division of [place]

## 2018-11-26 NOTE — Progress Notes (Addendum)
Progress Note    Gerald Clark  [id number] DOB: [birth year]  DOA: 11/25/2018 PCP: Adin Hector, MD     Assessment/Plan:   Active Problems:   CAP (community acquired pneumonia)   Body mass index is 25.82 kg/m.     Acute hypoxemic respiratory failure: Switch from nonrebreathing mask to oxygen therapy via high flow nasal cannula.  Consulted pulmonologist to assist with management.  Interstitial infiltrates probably from pneumonia: Continue empiric IV antibiotics.  Coronavirus test was negative x2.  Hilar mediastinal lymphadenopathy: Follow-up with pulmonologist.  Hypokalemia: improved.  Leukocytosis: Probably due to underlying infection.  Dexamethasone could also be contributing to leukocytosis.  Hypertension: Continue antihypertensives  Type 2 diabetes mellitus: Hemoglobin A1c 6.2.  Hold Metformin, Actos and glipizide.  Follow-up as needed for hypoglycemia.     Family Communication/Anticipated D/C date and plan/Code Status   DVT prophylaxis: Lovenox Code Status: DNR Family Communication: Plan discussed with patient and his wife at bedside Disposition Plan: Possible discharge home in 4 to 5 days      Subjective:   C/o shortness of breath.  According to his nurse, oxygen saturation drops into the 60s when nonrebreathing mask is taken off briefly for meals.   Objective:    Vitals:   11/26/18 1200 11/26/18 1230 11/26/18 1330 11/26/18 1400  BP: 134/68 122/62 124/77 (!) 154/80  Pulse: 92 96 98 99  Resp: (!) 29 (!) 36 (!) 32 (!) 28  Temp:      TempSrc:      SpO2: 94% (!) 84% (!) 78% 92%  Weight:      Height:        Intake/Output Summary (Last 24 hours) at 11/26/2018 1501 Last data filed at 11/26/2018 0800 Gross per 24 hour  Intake 1100 ml  Output 1700 ml  Net -600 ml   Filed Weights   11/25/18 1517  Weight: 72.6 kg    Exam:  GEN: mild respiratory distress SKIN: No rash EYES: EOMI ENT: MMM CV: RRR PULM: bibasilar rales, no  wheezing heard ABD: soft, ND, NT, +BS CNS: AAO x 3, non focal EXT: No edema or tenderness  Data Reviewed:   I have personally reviewed following labs and imaging studies:  Labs: Labs show the following:   Basic Metabolic Panel: Recent Labs  Lab 11/25/18 1527 11/25/18 2236 11/26/18 1003  NA 132*  --  134*  K 3.4*  --  4.0  CL 95*  --  98  CO2 24  --  21*  GLUCOSE 94  --  206*  BUN 17  --  23  CREATININE 1.03  --  0.89  CALCIUM 9.9  --  9.3  MG  --  1.9  --    GFR Estimated Creatinine Clearance: 66.7 mL/min (by C-G formula based on SCr of 0.89 mg/dL). Liver Function Tests: Recent Labs  Lab 11/26/18 1003  AST 38  ALT 18  ALKPHOS 59  BILITOT 1.4*  PROT 8.2*  ALBUMIN 2.9*   No results for input(s): LIPASE, AMYLASE in the last 168 hours. No results for input(s): AMMONIA in the last 168 hours. Coagulation profile No results for input(s): INR, PROTIME in the last 168 hours.  CBC: Recent Labs  Lab 11/25/18 1527 11/26/18 1003  WBC 16.4* 25.2*  NEUTROABS 13.0*  --   HGB 14.4 13.1  HCT 44.1 41.2  MCV 85.1 86.9  PLT 416* 433*   Cardiac Enzymes: No results for input(s): CKTOTAL, CKMB, CKMBINDEX, TROPONINI in the last  168 hours. BNP (last 3 results) No results for input(s): PROBNP in the last 8760 hours. CBG: Recent Labs  Lab 11/25/18 2233 11/26/18 0914 11/26/18 1139  GLUCAP 156* 155* 188*   D-Dimer: No results for input(s): DDIMER in the last 72 hours. Hgb A1c: Recent Labs    11/25/18 2236  HGBA1C 6.2*   Lipid Profile: No results for input(s): CHOL, HDL, LDLCALC, TRIG, CHOLHDL, LDLDIRECT in the last 72 hours. Thyroid function studies: No results for input(s): TSH, T4TOTAL, T3FREE, THYROIDAB in the last 72 hours.  Invalid input(s): FREET3 Anemia work up: Recent Labs    11/25/18 2236  FERRITIN 183   Sepsis Labs: Recent Labs  Lab 11/25/18 1527 11/25/18 1735 11/26/18 1003  WBC 16.4*  --  25.2*  LATICACIDVEN 2.9* 1.9  --      Microbiology Recent Results (from the past 240 hour(s))  SARS Coronavirus 2 by RT PCR (hospital order, performed in [facility] hospital lab) Nasopharyngeal Nasopharyngeal Swab     Status: None   Collection Time: 11/25/18  3:27 PM   Specimen: Nasopharyngeal Swab  Result Value Ref Range Status   SARS Coronavirus 2 NEGATIVE NEGATIVE Final    Comment: (NOTE) If result is NEGATIVE SARS-CoV-2 target nucleic acids are NOT DETECTED. The SARS-CoV-2 RNA is generally detectable in upper and lower  respiratory specimens during the acute phase of infection. The lowest  concentration of SARS-CoV-2 viral copies this assay can detect is 250  copies / mL. A negative result does not preclude SARS-CoV-2 infection  and should not be used as the sole basis for treatment or other  patient management decisions.  A negative result may occur with  improper specimen collection / handling, submission of specimen other  than nasopharyngeal swab, presence of viral mutation(s) within the  areas targeted by this assay, and inadequate number of viral copies  (<250 copies / mL). A negative result must be combined with clinical  observations, patient history, and epidemiological information. If result is POSITIVE SARS-CoV-2 target nucleic acids are DETECTED. The SARS-CoV-2 RNA is generally detectable in upper and lower  respiratory specimens dur ing the acute phase of infection.  Positive  results are indicative of active infection with SARS-CoV-2.  Clinical  correlation with patient history and other diagnostic information is  necessary to determine patient infection status.  Positive results do  not rule out bacterial infection or co-infection with other viruses. If result is PRESUMPTIVE POSTIVE SARS-CoV-2 nucleic acids MAY BE PRESENT.   A presumptive positive result was obtained on the submitted specimen  and confirmed on repeat testing.  While 2019 novel coronavirus  (SARS-CoV-2) nucleic acids may be present  in the submitted sample  additional confirmatory testing may be necessary for epidemiological  and / or clinical management purposes  to differentiate between  SARS-CoV-2 and other Sarbecovirus currently known to infect humans.  If clinically indicated additional testing with an alternate test  methodology [phone]) is advised. The SARS-CoV-2 RNA is generally  detectable in upper and lower respiratory sp ecimens during the acute  phase of infection. The expected result is Negative. Fact Sheet for Patients:  StrictlyIdeas.no Fact Sheet for Healthcare Providers: BankingDealers.co.za This test is not yet approved or cleared by the [place] FDA and has been authorized for detection and/or diagnosis of SARS-CoV-2 by FDA under an Emergency Use Authorization (EUA).  This EUA will remain in effect (meaning this test can be used) for the duration of the COVID-19 declaration under Section 564(b)(1) of the Act, 21  U.S.C. section 360bbb-3(b)(1), unless the authorization is terminated or revoked sooner. Performed at [facility], [address] [place], [place] [address]   Blood culture (routine x 2)     Status: None (Preliminary result)   Collection Time: 11/25/18  3:27 PM   Specimen: BLOOD  Result Value Ref Range Status   Specimen Description BLOOD LEFT ANTECUBITAL  Final   Special Requests   Final    BOTTLES DRAWN AEROBIC AND ANAEROBIC Blood Culture adequate volume   Culture   Final    NO GROWTH < 12 HOURS Performed at [facility], [address], [place], [place] [address]    Report Status PENDING  Incomplete  Blood culture (routine x 2)     Status: None (Preliminary result)   Collection Time: 11/25/18  7:40 PM   Specimen: BLOOD  Result Value Ref Range Status   Specimen Description BLOOD RIGHT ANTECUBITAL  Final   Special Requests   Final    BOTTLES DRAWN AEROBIC AND ANAEROBIC Blood Culture results may not be  optimal due to an excessive volume of blood received in culture bottles   Culture   Final    NO GROWTH < 12 HOURS Performed at [facility], [address] [place], [place] [address]    Report Status PENDING  Incomplete  SARS Coronavirus 2 by RT PCR (hospital order, performed in [place] hospital lab) Nasopharyngeal Nasopharyngeal Swab     Status: None   Collection Time: 11/26/18 10:03 AM   Specimen: Nasopharyngeal Swab  Result Value Ref Range Status   SARS Coronavirus 2 NEGATIVE NEGATIVE Final    Comment: (NOTE) If result is NEGATIVE SARS-CoV-2 target nucleic acids are NOT DETECTED. The SARS-CoV-2 RNA is generally detectable in upper and lower  respiratory specimens during the acute phase of infection. The lowest  concentration of SARS-CoV-2 viral copies this assay can detect is 250  copies / mL. A negative result does not preclude SARS-CoV-2 infection  and should not be used as the sole basis for treatment or other  patient management decisions.  A negative result may occur with  improper specimen collection / handling, submission of specimen other  than nasopharyngeal swab, presence of viral mutation(s) within the  areas targeted by this assay, and inadequate number of viral copies  (<250 copies / mL). A negative result must be combined with clinical  observations, patient history, and epidemiological information. If result is POSITIVE SARS-CoV-2 target nucleic acids are DETECTED. The SARS-CoV-2 RNA is generally detectable in upper and lower  respiratory specimens dur ing the acute phase of infection.  Positive  results are indicative of active infection with SARS-CoV-2.  Clinical  correlation with patient history and other diagnostic information is  necessary to determine patient infection status.  Positive results do  not rule out bacterial infection or co-infection with other viruses. If result is PRESUMPTIVE POSTIVE SARS-CoV-2 nucleic acids MAY BE PRESENT.   A  presumptive positive result was obtained on the submitted specimen  and confirmed on repeat testing.  While 2019 novel coronavirus  (SARS-CoV-2) nucleic acids may be present in the submitted sample  additional confirmatory testing may be necessary for epidemiological  and / or clinical management purposes  to differentiate between  SARS-CoV-2 and other Sarbecovirus currently known to infect humans.  If clinically indicated additional testing with an alternate test  methodology [phone]) is advised. The SARS-CoV-2 RNA is generally  detectable in upper and lower respiratory sp ecimens during the acute  phase  of infection. The expected result is Negative. Fact Sheet for Patients:  StrictlyIdeas.no Fact Sheet for Healthcare Providers: BankingDealers.co.za This test is not yet approved or cleared by the [place] FDA and has been authorized for detection and/or diagnosis of SARS-CoV-2 by FDA under an Emergency Use Authorization (EUA).  This EUA will remain in effect (meaning this test can be used) for the duration of the COVID-19 declaration under Section 564(b)(1) of the Act, 21 U.S.C. section 360bbb-3(b)(1), unless the authorization is terminated or revoked sooner. Performed at [facility], [address] [place], [place] [address]     Procedures and diagnostic studies:  Ct Angio Chest Pe W And/or Wo Contrast  Result Date: 11/25/2018 CLINICAL DATA:  Shortness of breath beginning 4 weeks ago, gradually worsening. EXAM: CT ANGIOGRAPHY CHEST WITH CONTRAST TECHNIQUE: Multidetector CT imaging of the chest was performed using the standard protocol during bolus administration of intravenous contrast. Multiplanar CT image reconstructions and MIPs were obtained to evaluate the vascular anatomy. CONTRAST:  32mL OMNIPAQUE IOHEXOL 350 MG/ML SOLN COMPARISON:  Current chest radiograph. FINDINGS: Cardiovascular: There is satisfactory  opacification of the pulmonary arteries to the segmental level. There is no evidence of a pulmonary embolism. Heart is normal in size. Three-vessel coronary artery calcifications. No pericardial effusion. Great vessels normal in caliber. No aortic dissection. Aortic atherosclerosis. Mediastinum/Nodes: 9 mm hypoattenuating inferior right thyroid lobe nodule. No neck base or axillary masses or enlarged lymph nodes. Mediastinal adenopathy. Several reference measurements are made. 17 mm short axis prevascular node. Azygos level right paratracheal node measuring 17 mm in short axis. More superior right paratracheal node measuring 14 mm in short axis subcarinal node measuring 2.1 cm in short axis. Mildly enlarged hilar nodes, 1.6 cm short axis superior right hilar node and a 1.5 cm mid left hilar and 1.8 cm left infrahilar nodes. Trachea and esophagus are unremarkable.  Small hiatal hernia. Lungs/Pleura: Lungs demonstrate coarse heterogeneous interstitial thickening, areas of interspersed cystic change, architectural distortion and intervening areas of ground-glass opacity, the latter most prominent in the lower lobes. Cystic change appears to be underlying paraseptal and mild centrilobular emphysema. Minimal bronchiectasis. No lung mass or suspicious nodule. No pleural effusion or pneumothorax. Upper Abdomen: No acute findings. Prominent gastrohepatic ligament lymph nodes, largest 1 point 2 cm in short axis. Musculoskeletal: No fracture or acute finding. No osteoblastic or osteolytic lesions. Review of the MIP images confirms the above findings. IMPRESSION: 1. No evidence of a pulmonary embolism. 2. Extensive findings of interstitial lung disease include a prominent component of ground-glass opacity, the latter most evident in the lower lobes. There is associated prominent mediastinal and less prominent hilar adenopathy. Mild underlying emphysema. 3. Chronic findings include three-vessel coronary artery calcifications  and aortic atherosclerosis. Aortic Atherosclerosis (ICD10-I70.0) and Emphysema (ICD10-J43.9). Electronically Signed   By: Lajean Manes M.D.   On: 11/25/2018 18:24   Dg Chest Port 1 View  Result Date: 11/25/2018 CLINICAL DATA:  Shortness of breath starting 4 weeks ago, gradually worsening. Former smoker. EXAM: PORTABLE CHEST 1 VIEW COMPARISON:  None. FINDINGS: Borderline cardiomegaly. Patchy bilateral airspace opacities, predominantly peripheral. No pleural effusion or pneumothorax seen. Osseous structures about the chest are unremarkable. IMPRESSION: 1. Borderline cardiomegaly. 2. Bilateral airspace opacities, of uncertain chronicity. Findings could represent chronic interstitial lung disease/fibrosis, acute interstitial edema or multifocal pneumonia. In the absence of fever, suspect some degree of acute edema superimposed on chronic interstitial lung disease/fibrosis. Electronically Signed   By: Franki Cabot M.D.   On: 11/25/2018 16:20  Medications:   . aspirin EC  81 mg Oral Daily  . brimonidine  1 drop Both Eyes BID  . clopidogrel  75 mg Oral Daily  . dexamethasone (DECADRON) injection  6 mg Intravenous Q24H  . dorzolamide  1 drop Both Eyes BID  . enoxaparin (LOVENOX) injection  40 mg Subcutaneous Q24H  . hydrochlorothiazide  25 mg Oral Daily  . insulin aspart  0-9 Units Subcutaneous TID WC  . ipratropium-albuterol  3 mL Nebulization QID  . latanoprost  1 drop Both Eyes QHS  . lisinopril  40 mg Oral Daily  . magnesium oxide  400 mg Oral Daily  . niacin  500 mg Oral QHS  . potassium chloride  40 mEq Oral Once  . Potassium  99 mg Oral Daily  . timolol  1 drop Both Eyes BID   Continuous Infusions: . sodium chloride 75 mL/hr at 11/25/18 2349  . azithromycin 500 mg (11/26/18 0123)  . cefTRIAXone (ROCEPHIN)  IV Stopped (11/26/18 0102)     LOS: 1 day   Chukwudi Ewen  [facility] Pager [phone].   *Please refer to [email or web address], password [email or web address] to get updated schedule on  who will round on this patient, as hospitalists switch teams weekly. If 7PM-7AM, please contact night-coverage at [email or web address], password [email or web address] for any overnight needs.  11/26/2018, 3:01 PM

## 2018-11-26 NOTE — ED Notes (Signed)
Admit physician text message sent by secretary, call back received, aware patient was not tolerating high flow, patient cyanotic and c/o of difficulty breathing. Ed Physician called into room to assess patient status. Patient left on hgh flow and non-rebreather added. Sating in the soft 90's, reports breathing better, skin slowly turned pink.

## 2018-11-26 NOTE — ED Notes (Signed)
Labs drawn and sent. Blood glucose obtained and documented.=, scheduled meds given,. Admit doctor at bedside discontinued IVF. RT called for swab specimen. Awaiting pharmacy to verify new meds ordered.

## 2018-11-26 NOTE — ED Notes (Addendum)
Patient assisted from bed to commode to have bowel movement. Patient did not tolerate not having o2 on. Patient became cyanotic and increased sob, sating 74% on room air. Patient placed back in bed an placed on hiflow o2, slowly increased to 92% skin pink warn and dry. patient was advised would need to use bedpan due to his exertion and unable to tolerate not having o2. Patient verbalized understanding. Will continue to monitor. Awaiting bed status.

## 2018-11-26 NOTE — ED Notes (Signed)
Lunch tray provided. 

## 2018-11-26 NOTE — ED Notes (Signed)
RT contacted to place high flow nasal canula on patient

## 2018-11-26 NOTE — ED Notes (Signed)
Patient reports feeling a lot better skin pink warm and dry. Sating 95 % on NRB

## 2018-11-26 NOTE — ED Notes (Signed)
Patient on non rebreather, not tolerating being off non rebreather. Sating 92% on nonrebreathing, when taken of non rebreather patient desats into the low 70's and becomes very cyanotic.

## 2018-11-26 NOTE — ED Notes (Signed)
Assumed care of patient patient aox4, vss. Patient on high flow set up by RT set on 15% tolerating well. Scheduled meds given by prior nurse. Ns infusing. Family at bedside. Patient s/o at bedside.

## 2018-11-26 NOTE — ED Notes (Signed)
Patient constantly on the call bell for monitor noises, pump noises, and blankets. Reoriented patient what the monitors were for and when to use the call bell appropriately. Verbalizes understanding.

## 2018-11-26 NOTE — ED Notes (Signed)
ED TO INPATIENT HANDOFF REPORT  ED Nurse Name and Phone #:    S Name/Age/Gender Gerald Clark 73 y.o. male Room/Bed: ED16A/ED16A  Code Status   Code Status: DNR  Home/SNF/Other Home Patient oriented to: self, place, time and situation Is this baseline? Yes   Triage Complete: Triage complete  Chief Complaint Shob  Triage Note Pt via pov from home with sob that started 4 weeks ago, gradually worsening. Pt said it got much worse today. Pt alert & oriented with obvious sob, speaking breathlessly.   Allergies No Known Allergies  Level of Care/Admitting Diagnosis ED Disposition    ED Disposition Condition [facility]: [place] [[id number]]  Level of Care: Med-Surg [16]  Covid Evaluation: Confirmed COVID Negative  Diagnosis: Community acquired pneumonia [id number]  Admitting Physician: Christel Mormon [id number]  Attending Physician: Christel Mormon [id number]  Estimated length of stay: 3 - 4 days  Certification:: I certify this patient will need inpatient services for at least 2 midnights  PT Class (Do Not Modify): Inpatient [101]  PT Acc Code (Do Not Modify): Private [1]       B Medical/Surgery History Past Medical History:  Diagnosis Date  . Arthritis   . Asthma    childhood asthma  . Cancer ([place])    Basal Cell Skin Cancer  . Diabetes mellitus without complication ([place])   . GERD (gastroesophageal reflux disease)   . Glaucoma   . History of chicken pox   . Hyperlipemia   . Hypertension   . Peripheral vascular disease [facility])    Past Surgical History:  Procedure Laterality Date  . COLONOSCOPY N/A 06/02/2014   Procedure: COLONOSCOPY;  Surgeon: Josefine Class, MD;  Location: [facility] ENDOSCOPY;  Service: Endoscopy;  Laterality: N/A;  . INGUINAL HERNIA REPAIR Left 06/28/2016   Procedure: HERNIA REPAIR INGUINAL ADULT WITH MESH;  Surgeon: Leonie Green, MD;  Location: [facility] ORS;  Service: General;  Laterality: Left;  . LOWER  EXTREMITY ANGIOGRAPHY Right 10/24/2016   Procedure: Lower Extremity Angiography;  Surgeon: Algernon Huxley, MD;  Location: [place] CV LAB;  Service: Cardiovascular;  Laterality: Right;     A IV Location/Drains/Wounds Patient Lines/Drains/Airways Status   Active Line/Drains/Airways    Name:   Placement date:   Placement time:   Site:   Days:   Peripheral IV 11/25/18 Left Antecubital   11/25/18    1530    Antecubital   1   Peripheral IV 11/25/18 Right Antecubital   11/25/18    1941    Antecubital   1   Incision (Closed) 06/28/16 Groin Left   06/28/16    1003     881          Intake/Output Last 24 hours  Intake/Output Summary (Last 24 hours) at 11/26/2018 2123 Last data filed at 11/26/2018 2037 Gross per 24 hour  Intake 100 ml  Output 2900 ml  Net -2800 ml    Labs/Imaging Results for orders placed or performed during the hospital encounter of 11/25/18 (from the past 48 hour(s))  CBC with Differential     Status: Abnormal   Collection Time: 11/25/18  3:27 PM  Result Value Ref Range   WBC 16.4 (H) 4.0 - 10.5 K/uL   RBC 5.18 4.22 - 5.81 MIL/uL   Hemoglobin 14.4 13.0 - 17.0 g/dL   HCT 44.1 39.0 - 52.0 %   MCV 85.1 80.0 - 100.0 fL   MCH 27.8 26.0 -  34.0 pg   MCHC 32.7 30.0 - 36.0 g/dL   RDW 14.6 11.5 - 15.5 %   Platelets 416 (H) 150 - 400 K/uL   nRBC 0.0 0.0 - 0.2 %   Neutrophils Relative % 79 %   Neutro Abs 13.0 (H) 1.7 - 7.7 K/uL   Lymphocytes Relative 10 %   Lymphs Abs 1.6 0.7 - 4.0 K/uL   Monocytes Relative 8 %   Monocytes Absolute 1.3 (H) 0.1 - 1.0 K/uL   Eosinophils Relative 1 %   Eosinophils Absolute 0.2 0.0 - 0.5 K/uL   Basophils Relative 1 %   Basophils Absolute 0.1 0.0 - 0.1 K/uL   Immature Granulocytes 1 %   Abs Immature Granulocytes 0.13 (H) 0.00 - 0.07 K/uL    Comment: Performed at [facility], [address], [place], [place] [id number]  Basic metabolic panel     Status: Abnormal   Collection Time: 11/25/18  3:27 PM  Result Value Ref Range    Sodium 132 (L) 135 - 145 mmol/L   Potassium 3.4 (L) 3.5 - 5.1 mmol/L   Chloride 95 (L) 98 - 111 mmol/L   CO2 24 22 - 32 mmol/L   Glucose, Bld 94 70 - 99 mg/dL   BUN 17 8 - 23 mg/dL   Creatinine, Ser 1.03 0.61 - 1.24 mg/dL   Calcium 9.9 8.9 - 10.3 mg/dL   GFR calc non Af Amer >60 >60 mL/min   GFR calc Af Amer >60 >60 mL/min   Anion gap 13 5 - 15    Comment: Performed at [facility], [address] [place], [place] [address]  Brain natriuretic peptide     Status: None   Collection Time: 11/25/18  3:27 PM  Result Value Ref Range   B Natriuretic Peptide 84.0 0.0 - 100.0 pg/mL    Comment: Performed at [facility], [address] [place], [place] [address]  Fibrin derivatives D-Dimer     Status: Abnormal   Collection Time: 11/25/18  3:27 PM  Result Value Ref Range   Fibrin derivatives D-dimer ([facility]) 534.04 (H) 0.00 - 499.00 ng/mL (FEU)    Comment: (NOTE) <> Exclusion of Venous Thromboembolism (VTE) - OUTPATIENT ONLY   (Emergency Department or [place])   0-499 ng/ml (FEU): With a low to intermediate pretest probability                      for VTE this test result excludes the diagnosis                      of VTE.   >499 ng/ml (FEU) : VTE not excluded; additional work up for VTE is                      required. <> Testing on Inpatients and Evaluation of Disseminated Intravascular   Coagulation (DIC) Reference Range:   0-499 ng/ml (FEU) Performed at [facility], [address] [place], [place] [address]   Troponin I (High Sensitivity)     Status: Abnormal   Collection Time: 11/25/18  3:27 PM  Result Value Ref Range   Troponin I (High Sensitivity) 23 (H) <18 ng/L    Comment: (NOTE) Elevated high sensitivity troponin I (hsTnI) values and significant  changes across serial measurements may suggest ACS but many other  chronic and acute conditions are known to elevate hsTnI results.  Refer to the "Links" section for chest pain  algorithms and  additional  guidance. Performed at [facility], [address] [place], [place] [address]   SARS Coronavirus 2 by RT PCR (hospital order, performed in [facility] hospital lab) Nasopharyngeal Nasopharyngeal Swab     Status: None   Collection Time: 11/25/18  3:27 PM   Specimen: Nasopharyngeal Swab  Result Value Ref Range   SARS Coronavirus 2 NEGATIVE NEGATIVE    Comment: (NOTE) If result is NEGATIVE SARS-CoV-2 target nucleic acids are NOT DETECTED. The SARS-CoV-2 RNA is generally detectable in upper and lower  respiratory specimens during the acute phase of infection. The lowest  concentration of SARS-CoV-2 viral copies this assay can detect is 250  copies / mL. A negative result does not preclude SARS-CoV-2 infection  and should not be used as the sole basis for treatment or other  patient management decisions.  A negative result may occur with  improper specimen collection / handling, submission of specimen other  than nasopharyngeal swab, presence of viral mutation(s) within the  areas targeted by this assay, and inadequate number of viral copies  (<250 copies / mL). A negative result must be combined with clinical  observations, patient history, and epidemiological information. If result is POSITIVE SARS-CoV-2 target nucleic acids are DETECTED. The SARS-CoV-2 RNA is generally detectable in upper and lower  respiratory specimens dur ing the acute phase of infection.  Positive  results are indicative of active infection with SARS-CoV-2.  Clinical  correlation with patient history and other diagnostic information is  necessary to determine patient infection status.  Positive results do  not rule out bacterial infection or co-infection with other viruses. If result is PRESUMPTIVE POSTIVE SARS-CoV-2 nucleic acids MAY BE PRESENT.   A presumptive positive result was obtained on the submitted specimen  and confirmed on repeat testing.  While 2019 novel coronavirus   (SARS-CoV-2) nucleic acids may be present in the submitted sample  additional confirmatory testing may be necessary for epidemiological  and / or clinical management purposes  to differentiate between  SARS-CoV-2 and other Sarbecovirus currently known to infect humans.  If clinically indicated additional testing with an alternate test  methodology [phone]) is advised. The SARS-CoV-2 RNA is generally  detectable in upper and lower respiratory sp ecimens during the acute  phase of infection. The expected result is Negative. Fact Sheet for Patients:  StrictlyIdeas.no Fact Sheet for Healthcare Providers: BankingDealers.co.za This test is not yet approved or cleared by the [place] FDA and has been authorized for detection and/or diagnosis of SARS-CoV-2 by FDA under an Emergency Use Authorization (EUA).  This EUA will remain in effect (meaning this test can be used) for the duration of the COVID-19 declaration under Section 564(b)(1) of the Act, 21 U.S.C. section 360bbb-3(b)(1), unless the authorization is terminated or revoked sooner. Performed at [facility], [address] [place], [place] [address]   Lactic acid, plasma     Status: Abnormal   Collection Time: 11/25/18  3:27 PM  Result Value Ref Range   Lactic Acid, Venous 2.9 (HH) 0.5 - 1.9 mmol/L    Comment: CRITICAL RESULT CALLED TO, READ BACK BY AND VERIFIED WITH MAUREEN TUMEY ON 11/25/2018 AT 1605 TIK Performed at [facility], [address] [place], [place] [address]   Blood culture (routine x 2)     Status: None (Preliminary result)   Collection Time: 11/25/18  3:27 PM   Specimen: BLOOD  Result Value Ref Range   Specimen Description BLOOD LEFT ANTECUBITAL  Special Requests      BOTTLES DRAWN AEROBIC AND ANAEROBIC Blood Culture adequate volume   Culture      NO GROWTH < 12 HOURS Performed at [facility], [address]  [place], [place] [address]    Report Status PENDING   Blood gas, venous     Status: Abnormal   Collection Time: 11/25/18  3:28 PM  Result Value Ref Range   pH, Ven 7.44 (H) 7.250 - 7.430   pCO2, Ven 39 (L) 44.0 - 60.0 mmHg   Bicarbonate 26.5 20.0 - 28.0 mmol/L   Acid-Base Excess 2.3 (H) 0.0 - 2.0 mmol/L   O2 Saturation 45.8 %   Patient temperature 37.0    Collection site VEIN    Sample type VENIPUNCTURE     Comment: Performed at [facility], [place], [place] [address]  Troponin I (High Sensitivity)     Status: Abnormal   Collection Time: 11/25/18  5:32 PM  Result Value Ref Range   Troponin I (High Sensitivity) 25 (H) <18 ng/L    Comment: (NOTE) Elevated high sensitivity troponin I (hsTnI) values and significant  changes across serial measurements may suggest ACS but many other  chronic and acute conditions are known to elevate hsTnI results.  Refer to the "Links" section for chest pain algorithms and additional  guidance. Performed at [facility], [address] [place], [place] [address]   Lactic acid, plasma     Status: None   Collection Time: 11/25/18  5:35 PM  Result Value Ref Range   Lactic Acid, Venous 1.9 0.5 - 1.9 mmol/L    Comment: Performed at [facility], [address] [place], [place] [address]  Blood culture (routine x 2)     Status: None (Preliminary result)   Collection Time: 11/25/18  7:40 PM   Specimen: BLOOD  Result Value Ref Range   Specimen Description BLOOD RIGHT ANTECUBITAL    Special Requests      BOTTLES DRAWN AEROBIC AND ANAEROBIC Blood Culture results may not be optimal due to an excessive volume of blood received in culture bottles   Culture      NO GROWTH < 12 HOURS Performed at [facility], [address] [place], [place] [address]    Report Status PENDING   Glucose, capillary     Status: Abnormal   Collection Time: 11/25/18 10:33 PM  Result Value Ref Range   Glucose-Capillary 156  (H) 70 - 99 mg/dL  Hemoglobin A1c     Status: Abnormal   Collection Time: 11/25/18 10:36 PM  Result Value Ref Range   Hgb A1c MFr Bld 6.2 (H) 4.8 - 5.6 %    Comment: (NOTE) Pre diabetes:          5.7%-6.4% Diabetes:              >6.4% Glycemic control for   <7.0% adults with diabetes    Mean Plasma Glucose 131.24 mg/dL    Comment: Performed at [facility], [place] [address], [place], [place] [address]  HIV Antibody (routine testing w rflx)     Status: None   Collection Time: 11/25/18 10:36 PM  Result Value Ref Range   HIV Screen 4th Generation wRfx NON REACTIVE NON REACTIVE    Comment: Performed at [facility] [address], [place], [place] [address]  Lactate dehydrogenase     Status: Abnormal   Collection Time: 11/25/18 10:36 PM  Result Value  Ref Range   LDH 375 (H) 98 - 192 U/L    Comment: Performed at [facility], [address] [place], [place] [address]  C-reactive protein     Status: Abnormal   Collection Time: 11/25/18 10:36 PM  Result Value Ref Range   CRP 19.0 (H) <1.0 mg/dL    Comment: Performed at [facility] [address], [place], [place] [address]  Ferritin     Status: None   Collection Time: 11/25/18 10:36 PM  Result Value Ref Range   Ferritin 183 24 - 336 ng/mL    Comment: Performed at [facility], [address] [place], [place] [address]  Strep pneumoniae urinary antigen     Status: None   Collection Time: 11/25/18 10:36 PM  Result Value Ref Range   Strep Pneumo Urinary Antigen NEGATIVE NEGATIVE    Comment:        Infection due to S. pneumoniae cannot be absolutely ruled out since the antigen present may be below the detection limit of the test. Performed at [facility], [place] [address], [place], [place] [address]   Magnesium     Status: None   Collection Time: 11/25/18 10:36 PM  Result Value Ref Range   Magnesium 1.9 1.7 - 2.4 mg/dL    Comment: Performed at [facility], [address] [place], [place] [address]  Glucose, capillary     Status: Abnormal   Collection Time: 11/26/18  9:14 AM  Result Value Ref Range   Glucose-Capillary 155 (H) 70 - 99 mg/dL  Comprehensive metabolic panel     Status: Abnormal   Collection Time: 11/26/18 10:03 AM  Result Value Ref Range   Sodium 134 (L) 135 - 145 mmol/L   Potassium 4.0 3.5 - 5.1 mmol/L   Chloride 98 98 - 111 mmol/L   CO2 21 (L) 22 - 32 mmol/L   Glucose, Bld 206 (H) 70 - 99 mg/dL   BUN 23 8 - 23 mg/dL   Creatinine, Ser 0.89 0.61 - 1.24 mg/dL   Calcium 9.3 8.9 - 10.3 mg/dL   Total Protein 8.2 (H) 6.5 - 8.1 g/dL   Albumin 2.9 (L) 3.5 - 5.0 g/dL   AST 38 15 - 41 U/L   ALT 18 0 - 44 U/L   Alkaline Phosphatase 59 38 - 126 U/L   Total Bilirubin 1.4 (H) 0.3 - 1.2 mg/dL   GFR calc non Af Amer >60 >60 mL/min   GFR calc Af Amer >60 >60 mL/min   Anion gap 15 5 - 15    Comment: Performed at [facility], [address] [place], [place] [address]  CBC     Status: Abnormal   Collection Time: 11/26/18 10:03 AM  Result Value Ref Range   WBC 25.2 (H) 4.0 - 10.5 K/uL   RBC 4.74 4.22 - 5.81 MIL/uL   Hemoglobin 13.1 13.0 - 17.0 g/dL   HCT 41.2 39.0 - 52.0 %   MCV 86.9 80.0 - 100.0 fL   MCH 27.6 26.0 - 34.0 pg   MCHC 31.8 30.0 - 36.0 g/dL   RDW 14.8 11.5 - 15.5 %   Platelets 433 (H) 150 - 400 K/uL   nRBC 0.0 0.0 - 0.2 %    Comment: Performed at [facility], [address], [place], [place] [address]  SARS Coronavirus 2 by RT PCR (hospital order, performed in [facility] hospital lab) Nasopharyngeal Nasopharyngeal Swab     Status:  None   Collection Time: 11/26/18 10:03 AM   Specimen: Nasopharyngeal Swab  Result Value Ref Range   SARS Coronavirus 2 NEGATIVE NEGATIVE    Comment: (NOTE) If result is NEGATIVE SARS-CoV-2 target nucleic acids are NOT DETECTED. The SARS-CoV-2 RNA is generally detectable in upper and lower  respiratory specimens during the acute phase of infection. The lowest  concentration of  SARS-CoV-2 viral copies this assay can detect is 250  copies / mL. A negative result does not preclude SARS-CoV-2 infection  and should not be used as the sole basis for treatment or other  patient management decisions.  A negative result may occur with  improper specimen collection / handling, submission of specimen other  than nasopharyngeal swab, presence of viral mutation(s) within the  areas targeted by this assay, and inadequate number of viral copies  (<250 copies / mL). A negative result must be combined with clinical  observations, patient history, and epidemiological information. If result is POSITIVE SARS-CoV-2 target nucleic acids are DETECTED. The SARS-CoV-2 RNA is generally detectable in upper and lower  respiratory specimens dur ing the acute phase of infection.  Positive  results are indicative of active infection with SARS-CoV-2.  Clinical  correlation with patient history and other diagnostic information is  necessary to determine patient infection status.  Positive results do  not rule out bacterial infection or co-infection with other viruses. If result is PRESUMPTIVE POSTIVE SARS-CoV-2 nucleic acids MAY BE PRESENT.   A presumptive positive result was obtained on the submitted specimen  and confirmed on repeat testing.  While 2019 novel coronavirus  (SARS-CoV-2) nucleic acids may be present in the submitted sample  additional confirmatory testing may be necessary for epidemiological  and / or clinical management purposes  to differentiate between  SARS-CoV-2 and other Sarbecovirus currently known to infect humans.  If clinically indicated additional testing with an alternate test  methodology [phone]) is advised. The SARS-CoV-2 RNA is generally  detectable in upper and lower respiratory sp ecimens during the acute  phase of infection. The expected result is Negative. Fact Sheet for Patients:  StrictlyIdeas.no Fact Sheet for Healthcare  Providers: BankingDealers.co.za This test is not yet approved or cleared by the [place] FDA and has been authorized for detection and/or diagnosis of SARS-CoV-2 by FDA under an Emergency Use Authorization (EUA).  This EUA will remain in effect (meaning this test can be used) for the duration of the COVID-19 declaration under Section 564(b)(1) of the Act, 21 U.S.C. section 360bbb-3(b)(1), unless the authorization is terminated or revoked sooner. Performed at [facility], [address] [place], [place] [address]   Glucose, capillary     Status: Abnormal   Collection Time: 11/26/18 11:39 AM  Result Value Ref Range   Glucose-Capillary 188 (H) 70 - 99 mg/dL  Procalcitonin - Baseline     Status: None   Collection Time: 11/26/18  4:38 PM  Result Value Ref Range   Procalcitonin 0.11 ng/mL    Comment:        Interpretation: PCT (Procalcitonin) <= 0.5 ng/mL: Systemic infection (sepsis) is not likely. Local bacterial infection is possible. (NOTE)       Sepsis PCT Algorithm           Lower Respiratory Tract                                      Infection PCT Algorithm    ----------------------------     ----------------------------  PCT < 0.25 ng/mL                PCT < 0.10 ng/mL         Strongly encourage             Strongly discourage   discontinuation of antibiotics    initiation of antibiotics    ----------------------------     -----------------------------       PCT 0.25 - 0.50 ng/mL            PCT 0.10 - 0.25 ng/mL               OR       >80% decrease in PCT            Discourage initiation of                                            antibiotics      Encourage discontinuation           of antibiotics    ----------------------------     -----------------------------         PCT >= 0.50 ng/mL              PCT 0.26 - 0.50 ng/mL               AND        <80% decrease in PCT             Encourage initiation of                                              antibiotics       Encourage continuation           of antibiotics    ----------------------------     -----------------------------        PCT >= 0.50 ng/mL                  PCT > 0.50 ng/mL               AND         increase in PCT                  Strongly encourage                                      initiation of antibiotics    Strongly encourage escalation           of antibiotics                                     -----------------------------                                           PCT <= 0.25 ng/mL  OR                                        > 80% decrease in PCT                                     Discontinue / Do not initiate                                             antibiotics Performed at [facility], [address] [place], [place] [address]   Glucose, capillary     Status: Abnormal   Collection Time: 11/26/18  4:42 PM  Result Value Ref Range   Glucose-Capillary 132 (H) 70 - 99 mg/dL  Blood gas, arterial     Status: Abnormal   Collection Time: 11/26/18  4:47 PM  Result Value Ref Range   FIO2 1.00    Delivery systems NON-REBREATHER OXYGEN MASK    pH, Arterial 7.53 (H) 7.350 - 7.450   pCO2 arterial 32 32.0 - 48.0 mmHg   pO2, Arterial 67 (L) 83.0 - 108.0 mmHg   Bicarbonate 26.7 20.0 - 28.0 mmol/L   Acid-Base Excess 4.4 (H) 0.0 - 2.0 mmol/L   O2 Saturation 95.1 %   Patient temperature 37.0    Collection site RIGHT RADIAL    Sample type ARTERIAL DRAW    Allens test (pass/fail) PASS PASS    Comment: Performed at [facility], [address] [place], [place] [address]   Ct Angio Chest Pe W And/or Wo Contrast  Result Date: 11/25/2018 CLINICAL DATA:  Shortness of breath beginning 4 weeks ago, gradually worsening. EXAM: CT ANGIOGRAPHY CHEST WITH CONTRAST TECHNIQUE: Multidetector CT imaging of the chest was performed using the standard protocol during bolus administration of  intravenous contrast. Multiplanar CT image reconstructions and MIPs were obtained to evaluate the vascular anatomy. CONTRAST:  34mL OMNIPAQUE IOHEXOL 350 MG/ML SOLN COMPARISON:  Current chest radiograph. FINDINGS: Cardiovascular: There is satisfactory opacification of the pulmonary arteries to the segmental level. There is no evidence of a pulmonary embolism. Heart is normal in size. Three-vessel coronary artery calcifications. No pericardial effusion. Great vessels normal in caliber. No aortic dissection. Aortic atherosclerosis. Mediastinum/Nodes: 9 mm hypoattenuating inferior right thyroid lobe nodule. No neck base or axillary masses or enlarged lymph nodes. Mediastinal adenopathy. Several reference measurements are made. 17 mm short axis prevascular node. Azygos level right paratracheal node measuring 17 mm in short axis. More superior right paratracheal node measuring 14 mm in short axis subcarinal node measuring 2.1 cm in short axis. Mildly enlarged hilar nodes, 1.6 cm short axis superior right hilar node and a 1.5 cm mid left hilar and 1.8 cm left infrahilar nodes. Trachea and esophagus are unremarkable.  Small hiatal hernia. Lungs/Pleura: Lungs demonstrate coarse heterogeneous interstitial thickening, areas of interspersed cystic change, architectural distortion and intervening areas of ground-glass opacity, the latter most prominent in the lower lobes. Cystic change appears to be underlying paraseptal and mild centrilobular emphysema. Minimal bronchiectasis. No lung mass or suspicious nodule. No pleural effusion or pneumothorax. Upper Abdomen: No acute findings. Prominent gastrohepatic ligament lymph nodes, largest 1 point 2 cm in short axis. Musculoskeletal: No fracture or acute finding.  No osteoblastic or osteolytic lesions. Review of the MIP images confirms the above findings. IMPRESSION: 1. No evidence of a pulmonary embolism. 2. Extensive findings of interstitial lung disease include a prominent  component of ground-glass opacity, the latter most evident in the lower lobes. There is associated prominent mediastinal and less prominent hilar adenopathy. Mild underlying emphysema. 3. Chronic findings include three-vessel coronary artery calcifications and aortic atherosclerosis. Aortic Atherosclerosis (ICD10-I70.0) and Emphysema (ICD10-J43.9). Electronically Signed   By: Lajean Manes M.D.   On: 11/25/2018 18:24   Dg Chest Port 1 View  Result Date: 11/25/2018 CLINICAL DATA:  Shortness of breath starting 4 weeks ago, gradually worsening. Former smoker. EXAM: PORTABLE CHEST 1 VIEW COMPARISON:  None. FINDINGS: Borderline cardiomegaly. Patchy bilateral airspace opacities, predominantly peripheral. No pleural effusion or pneumothorax seen. Osseous structures about the chest are unremarkable. IMPRESSION: 1. Borderline cardiomegaly. 2. Bilateral airspace opacities, of uncertain chronicity. Findings could represent chronic interstitial lung disease/fibrosis, acute interstitial edema or multifocal pneumonia. In the absence of fever, suspect some degree of acute edema superimposed on chronic interstitial lung disease/fibrosis. Electronically Signed   By: Franki Cabot M.D.   On: 11/25/2018 16:20    Pending Labs Unresulted Labs (From admission, onward)    Start     Ordered   12/03/18 0500  Creatinine, serum  (enoxaparin (LOVENOX)    CrCl >/= 30 ml/min)  Weekly,   STAT    Comments: while on enoxaparin therapy    11/26/18 0904   11/27/18 0500  Procalcitonin  Daily,   STAT     11/26/18 1606   11/26/18 1607  Respiratory Panel by PCR  (Respiratory virus panel with precautions)  ONCE - STAT,   STAT     11/26/18 1606   11/26/18 0050  Protime-INR  Add-on,   AD     11/26/18 0049   11/25/18 2041  Mycoplasma pneumoniae antibody, IgM  Once,   STAT     11/25/18 2040   11/25/18 2040  Legionella Pneumophila Serogp 1 Ur Ag  Once,   STAT     11/25/18 2040          Vitals/Pain Today's Vitals   11/26/18 1800  11/26/18 1815 11/26/18 1830 11/26/18 2107  BP: 137/76  132/72 139/65  Pulse: (!) 126 (!) 115 (!) 129 (!) 114  Resp: (!) 43 (!) 36 (!) 28 (!) 24  Temp:      TempSrc:      SpO2: (!) 89% 93% 93% 96%  Weight:      Height:      PainSc:    0-No pain    Isolation Precautions No active isolations  Medications Medications  aspirin EC tablet 81 mg (81 mg Oral Given 11/26/18 0926)  hydrochlorothiazide (HYDRODIURIL) tablet 25 mg (25 mg Oral Given 11/26/18 0927)  lisinopril (ZESTRIL) tablet 40 mg (40 mg Oral Given 11/26/18 0926)  insulin aspart (novoLOG) injection 0-9 Units (1 Units Subcutaneous Given 11/26/18 1643)  magnesium oxide (MAG-OX) tablet 400 mg (400 mg Oral Given 11/26/18 0928)  clopidogrel (PLAVIX) tablet 75 mg (75 mg Oral Given 11/26/18 0925)  niacin (SLO-NIACIN) CR tablet 500 mg (500 mg Oral Refused 11/25/18 2230)  brimonidine (ALPHAGAN) 0.2 % ophthalmic solution 1 drop (1 drop Both Eyes Given 11/26/18 1059)  dorzolamide (TRUSOPT) 2 % ophthalmic solution 1 drop (1 drop Both Eyes Given 11/26/18 1103)  latanoprost (XALATAN) 0.005 % ophthalmic solution 1 drop (1 drop Both Eyes Not Given 11/26/18 0158)  polyvinyl alcohol (LIQUIFILM TEARS) 1.4 % ophthalmic solution  1 drop (has no administration in time range)  timolol (TIMOPTIC) 0.5 % ophthalmic solution 1 drop (1 drop Both Eyes Given 11/26/18 1108)  0.9 %  sodium chloride infusion ( Intravenous New Bag/Given 11/25/18 2349)  acetaminophen (TYLENOL) tablet 650 mg (has no administration in time range)    Or  acetaminophen (TYLENOL) suppository 650 mg (has no administration in time range)  traZODone (DESYREL) tablet 25 mg (has no administration in time range)  magnesium hydroxide (MILK OF MAGNESIA) suspension 30 mL (has no administration in time range)  ondansetron (ZOFRAN) tablet 4 mg (has no administration in time range)    Or  ondansetron (ZOFRAN) injection 4 mg (has no administration in time range)  ipratropium-albuterol (DUONEB) 0.5-2.5 (3)  MG/3ML nebulizer solution 3 mL (3 mLs Nebulization Given 11/26/18 1644)  potassium chloride (KLOR-CON) packet 40 mEq (has no administration in time range)  enoxaparin (LOVENOX) injection 40 mg (has no administration in time range)  furosemide (LASIX) injection 40 mg (40 mg Intravenous Given 11/26/18 1646)  levofloxacin (LEVAQUIN) tablet 750 mg (750 mg Oral Given 11/26/18 1746)  methylPREDNISolone sodium succinate (SOLU-MEDROL) 125 mg/2 mL injection 80 mg (80 mg Intravenous Given 11/26/18 1747)  methylPREDNISolone sodium succinate (SOLU-MEDROL) 125 mg/2 mL injection 80 mg (80 mg Intravenous Given 11/25/18 1533)  sodium chloride 0.9 % bolus 1,000 mL (0 mLs Intravenous Stopped 11/25/18 1940)  iohexol (OMNIPAQUE) 350 MG/ML injection 75 mL (75 mLs Intravenous Contrast Given 11/25/18 1752)  heparin bolus via infusion 4,000 Units (4,000 Units Intravenous Bolus from Bag 11/25/18 2357)    Mobility walks Low fall risk   Focused Assessments medicine    R Recommendations: See Admitting Provider Note  Report given to:   Additional Notes:

## 2018-11-26 NOTE — ED Notes (Signed)
Eye drops are not available at this time. Will note in [facility]

## 2018-11-26 NOTE — ED Notes (Signed)
Blood glucose : 132

## 2018-11-26 NOTE — Progress Notes (Signed)
ANTICOAGULATION CONSULT NOTE - Initial Consult  Pharmacy Consult for Heparin Indication: Elevated D-dimer, rule out COVID-19, heparin infusion after bolus (per MD)  No Known Allergies  Patient Measurements: Height: 5\' 6"  (167.6 cm) Weight: 160 lb (72.6 kg) IBW/kg (Calculated) : 63.8 HEPARIN DW (KG): 72.6  Vital Signs: Temp: 98.5 F (36.9 C) (11/01 1515) Temp Source: Oral (11/01 1515) BP: 150/60 (11/01 2330) Pulse Rate: 99 (11/01 2330)  Labs: Recent Labs    11/25/18 1527 11/25/18 1732  HGB 14.4  --   HCT 44.1  --   PLT 416*  --   CREATININE 1.03  --   TROPONINIHS 23* 25*    Estimated Creatinine Clearance: 57.6 mL/min (by C-G formula based on SCr of 1.03 mg/dL).   Medical History: Past Medical History:  Diagnosis Date  . Arthritis   . Asthma    childhood asthma  . Cancer ([place])    Basal Cell Skin Cancer  . Diabetes mellitus without complication ([place])   . GERD (gastroesophageal reflux disease)   . Glaucoma   . History of chicken pox   . Hyperlipemia   . Hypertension   . Peripheral vascular disease ([place])    Medications:  (Not in a hospital admission)  Assessment: Pt not on anticoagulants per PTA med list.  Baseline labs ordered.  Goal of Therapy:  Heparin level 0.3-0.7 units/ml Monitor platelets by anticoagulation protocol: Yes   Plan:  Heparin 4000 unit bolus x 1 then infusion at 850 units/hr Check HL 8 hours after heparin started  Hart Robinsons A 11/26/2018,12:47 AM

## 2018-11-26 NOTE — ED Notes (Addendum)
This RN asked Dr. Sidney Ace why pt was "person under suspicion for Covid" Dr. Sidney Ace stated that this is due to pt's CT findings.

## 2018-11-26 NOTE — ED Notes (Signed)
Dinner tray provided

## 2018-11-26 NOTE — ED Notes (Signed)
Patient comfortable at present time. Vss. Awaiting  Lunch and bed status.

## 2018-11-27 ENCOUNTER — Inpatient Hospital Stay: Payer: Medicare HMO

## 2018-11-27 DIAGNOSIS — D72825 Bandemia: Secondary | ICD-10-CM | POA: Diagnosis present

## 2018-11-27 DIAGNOSIS — J849 Interstitial pulmonary disease, unspecified: Secondary | ICD-10-CM | POA: Diagnosis present

## 2018-11-27 DIAGNOSIS — R0902 Hypoxemia: Secondary | ICD-10-CM | POA: Diagnosis present

## 2018-11-27 DIAGNOSIS — R590 Localized enlarged lymph nodes: Secondary | ICD-10-CM | POA: Diagnosis present

## 2018-11-27 LAB — BASIC METABOLIC PANEL
Anion gap: 14 (ref 5–15)
BUN: 40 mg/dL — ABNORMAL HIGH (ref 8–23)
CO2: 23 mmol/L (ref 22–32)
Calcium: 9.3 mg/dL (ref 8.9–10.3)
Chloride: 96 mmol/L — ABNORMAL LOW (ref 98–111)
Creatinine, Ser: 0.99 mg/dL (ref 0.61–1.24)
GFR calc Af Amer: >60 mL/min (ref >60)
GFR calc non Af Amer: >60 mL/min (ref >60)
Glucose, Bld: 246 mg/dL — ABNORMAL HIGH (ref 70–99)
Potassium: 3.8 mmol/L (ref 3.5–5.1)
Sodium: 133 mmol/L — ABNORMAL LOW (ref 135–145)

## 2018-11-27 LAB — RESPIRATORY PANEL BY PCR

## 2018-11-27 LAB — PROCALCITONIN: Procalcitonin: 0.16 ng/mL

## 2018-11-27 LAB — GLUCOSE, CAPILLARY
Glucose-Capillary: 156 mg/dL — ABNORMAL HIGH (ref 70–99)
Glucose-Capillary: 192 mg/dL — ABNORMAL HIGH (ref 70–99)
Glucose-Capillary: 209 mg/dL — ABNORMAL HIGH (ref 70–99)
Glucose-Capillary: 169 mg/dL — ABNORMAL HIGH (ref 70–99)

## 2018-11-27 LAB — CBC WITH DIFFERENTIAL/PLATELET
Abs Immature Granulocytes: 0.11 10*3/uL — ABNORMAL HIGH (ref 0.00–0.07)
Basophils Absolute: 0 10*3/uL (ref 0.0–0.1)
Basophils Relative: 0 %
Eosinophils Absolute: 0 10*3/uL (ref 0.0–0.5)
Eosinophils Relative: 0 %
HCT: 41.8 % (ref 39.0–52.0)
Hemoglobin: 13.8 g/dL (ref 13.0–17.0)
Immature Granulocytes: 1 %
Lymphocytes Relative: 7 %
Lymphs Abs: 1.4 10*3/uL (ref 0.7–4.0)
MCH: 27.6 pg (ref 26.0–34.0)
MCHC: 33 g/dL (ref 30.0–36.0)
MCV: 83.6 fL (ref 80.0–100.0)
Monocytes Absolute: 1 10*3/uL (ref 0.1–1.0)
Monocytes Relative: 5 %
Neutro Abs: 16.5 10*3/uL — ABNORMAL HIGH (ref 1.7–7.7)
Neutrophils Relative %: 87 %
Platelets: 454 10*3/uL — ABNORMAL HIGH (ref 150–400)
RBC: 5 MIL/uL (ref 4.22–5.81)
RDW: 14.8 % (ref 11.5–15.5)
WBC: 19 10*3/uL — ABNORMAL HIGH (ref 4.0–10.5)
nRBC: 0 % (ref 0.0–0.2)

## 2018-11-27 LAB — LEGIONELLA PNEUMOPHILA SEROGP 1 UR AG: L. pneumophila Serogp 1 Ur Ag: NEGATIVE

## 2018-11-27 LAB — MYCOPLASMA PNEUMONIAE ANTIBODY, IGM: Mycoplasma pneumo IgM: <770 U/mL (ref 0–769)

## 2018-11-27 MED ORDER — ENSURE MAX PROTEIN PO LIQD
11.0000 [oz_av] | Freq: Two times a day (BID) | ORAL | Status: DC
  Administered 2018-11-28 – 2018-12-03 (×10): 11 [oz_av] via ORAL
  Filled 2018-11-27: qty 330

## 2018-11-27 MED ORDER — ADULT MULTIVITAMIN W/MINERALS CH
1.0000 | ORAL_TABLET | Freq: Every day | ORAL | Status: DC
  Administered 2018-11-28 – 2018-12-04 (×7): 1 via ORAL
  Filled 2018-11-27 (×7): qty 1

## 2018-11-27 NOTE — Progress Notes (Addendum)
Progress Note    Gerald Clark  [id number] DOB: 10-Jul-1945  DOA: 11/25/2018 PCP: Adin Hector, MD     Assessment/Plan:   Active Problems:   CAP (community acquired pneumonia)   Community acquired pneumonia   Hypoxia   Interstitial lung disease ([place])   Bandemia   Mediastinal adenopathy   Body mass index is 25.82 kg/m.    Acute hypoxemic respiratory failure: Continue oxygen via high flow nasal cannula.  Interstitial infiltrates: Suspected to be due to interstitial lung disease/pulmonary fibrosis with exacerbation.  Coronavirus test is negative x2.  Continue IV steroids and empiric oral Levaquin.  Pulmonologist is following.   Mediastinal and hilar lymphadenopathy: Follow-up with pulmonologist for further recommendations.  Hypokalemia: Repeat BMP today.  Leukocytosis: Repeat CBC today.  Peripheral vascular disease: Patient is on aspirin and Plavix from home.  Hypertension: BP is okay.  He is on HCTZ and lisinopril.  Type II DM: Metformin, Actos and glipizide have been held for now.  Humalog as needed for hyperglycemia.     Family Communication/Anticipated D/C date and plan/Code Status   DVT prophylaxis: Lovenox Code Status: DNR Family Communication: Plan discussed with patient and his wife at bedside Disposition Plan: Possible discharge home in 4 to 5 days      Subjective:   C/o cough productive of brownish sputum.  He thinks his breathing is a little better today.  No fever, chills, chest pain or wheezing.  His wife was at the bedside during my encounter.   Objective:    Vitals:   11/26/18 2228 11/27/18 0043 11/27/18 0517 11/27/18 0758  BP: 131/69 (!) 110/59 (!) 122/57   Pulse: (!) 112 88 87   Resp: 16 16 19    Temp: (!) 97.5 F (36.4 C)  97.6 F (36.4 C)   TempSrc: Oral  Oral   SpO2: 92% 92% 92% 95%  Weight:      Height:        Intake/Output Summary (Last 24 hours) at 11/27/2018 1335 Last data filed at 11/27/2018 1037 Gross  per 24 hour  Intake 1608.92 ml  Output 2100 ml  Net -491.08 ml   Filed Weights   11/25/18 1517  Weight: 72.6 kg    Exam:  GEN: NAD SKIN: No rash EYES: EOMI ENT: MMM CV: RRR PULM: no wheezing.  Adequate air entry bilaterally, bibasilar rales present ABD: soft, ND, NT, +BS CNS: AAO x 3, non focal EXT: No edema or tenderness   Data Reviewed:   I have personally reviewed following labs and imaging studies:  Labs: Labs show the following:   Basic Metabolic Panel: Recent Labs  Lab 11/25/18 1527 11/25/18 2236 11/26/18 1003  NA 132*  --  134*  K 3.4*  --  4.0  CL 95*  --  98  CO2 24  --  21*  GLUCOSE 94  --  206*  BUN 17  --  23  CREATININE 1.03  --  0.89  CALCIUM 9.9  --  9.3  MG  --  1.9  --    GFR Estimated Creatinine Clearance: 66.7 mL/min (by C-G formula based on SCr of 0.89 mg/dL). Liver Function Tests: Recent Labs  Lab 11/26/18 1003  AST 38  ALT 18  ALKPHOS 59  BILITOT 1.4*  PROT 8.2*  ALBUMIN 2.9*   No results for input(s): LIPASE, AMYLASE in the last 168 hours. No results for input(s): AMMONIA in the last 168 hours. Coagulation profile No results for input(s): INR,  PROTIME in the last 168 hours.  CBC: Recent Labs  Lab 11/25/18 1527 11/26/18 1003  WBC 16.4* 25.2*  NEUTROABS 13.0*  --   HGB 14.4 13.1  HCT 44.1 41.2  MCV 85.1 86.9  PLT 416* 433*   Cardiac Enzymes: No results for input(s): CKTOTAL, CKMB, CKMBINDEX, TROPONINI in the last 168 hours. BNP (last 3 results) No results for input(s): PROBNP in the last 8760 hours. CBG: Recent Labs  Lab 11/26/18 0914 11/26/18 1139 11/26/18 1642 11/27/18 0740 11/27/18 1151  GLUCAP 155* 188* 132* 156* 192*   D-Dimer: No results for input(s): DDIMER in the last 72 hours. Hgb A1c: Recent Labs    11/25/18 2236  HGBA1C 6.2*   Lipid Profile: No results for input(s): CHOL, HDL, LDLCALC, TRIG, CHOLHDL, LDLDIRECT in the last 72 hours. Thyroid function studies: No results for input(s):  TSH, T4TOTAL, T3FREE, THYROIDAB in the last 72 hours.  Invalid input(s): FREET3 Anemia work up: Recent Labs    11/25/18 2236  FERRITIN 183   Sepsis Labs: Recent Labs  Lab 11/25/18 1527 11/25/18 1735 11/26/18 1003 11/26/18 1638 11/27/18 0409  PROCALCITON  --   --   --  0.11 0.16  WBC 16.4*  --  25.2*  --   --   LATICACIDVEN 2.9* 1.9  --   --   --     Microbiology Recent Results (from the past 240 hour(s))  SARS Coronavirus 2 by RT PCR (hospital order, performed in [place] hospital lab) Nasopharyngeal Nasopharyngeal Swab     Status: None   Collection Time: 11/25/18  3:27 PM   Specimen: Nasopharyngeal Swab  Result Value Ref Range Status   SARS Coronavirus 2 NEGATIVE NEGATIVE Final    Comment: (NOTE) If result is NEGATIVE SARS-CoV-2 target nucleic acids are NOT DETECTED. The SARS-CoV-2 RNA is generally detectable in upper and lower  respiratory specimens during the acute phase of infection. The lowest  concentration of SARS-CoV-2 viral copies this assay can detect is 250  copies / mL. A negative result does not preclude SARS-CoV-2 infection  and should not be used as the sole basis for treatment or other  patient management decisions.  A negative result may occur with  improper specimen collection / handling, submission of specimen other  than nasopharyngeal swab, presence of viral mutation(s) within the  areas targeted by this assay, and inadequate number of viral copies  (<250 copies / mL). A negative result must be combined with clinical  observations, patient history, and epidemiological information. If result is POSITIVE SARS-CoV-2 target nucleic acids are DETECTED. The SARS-CoV-2 RNA is generally detectable in upper and lower  respiratory specimens dur ing the acute phase of infection.  Positive  results are indicative of active infection with SARS-CoV-2.  Clinical  correlation with patient history and other diagnostic information is  necessary to determine  patient infection status.  Positive results do  not rule out bacterial infection or co-infection with other viruses. If result is PRESUMPTIVE POSTIVE SARS-CoV-2 nucleic acids MAY BE PRESENT.   A presumptive positive result was obtained on the submitted specimen  and confirmed on repeat testing.  While 2019 novel coronavirus  (SARS-CoV-2) nucleic acids may be present in the submitted sample  additional confirmatory testing may be necessary for epidemiological  and / or clinical management purposes  to differentiate between  SARS-CoV-2 and other Sarbecovirus currently known to infect humans.  If clinically indicated additional testing with an alternate test  methodology [phone]) is advised. The SARS-CoV-2 RNA is  generally  detectable in upper and lower respiratory sp ecimens during the acute  phase of infection. The expected result is Negative. Fact Sheet for Patients:  StrictlyIdeas.no Fact Sheet for Healthcare Providers: BankingDealers.co.za This test is not yet approved or cleared by the [place] FDA and has been authorized for detection and/or diagnosis of SARS-CoV-2 by FDA under an Emergency Use Authorization (EUA).  This EUA will remain in effect (meaning this test can be used) for the duration of the COVID-19 declaration under Section 564(b)(1) of the Act, 21 U.S.C. section 360bbb-3(b)(1), unless the authorization is terminated or revoked sooner. Performed at [facility], [address] [place], [place] [address]   Blood culture (routine x 2)     Status: None (Preliminary result)   Collection Time: 11/25/18  3:27 PM   Specimen: BLOOD  Result Value Ref Range Status   Specimen Description BLOOD LEFT ANTECUBITAL  Final   Special Requests   Final    BOTTLES DRAWN AEROBIC AND ANAEROBIC Blood Culture adequate volume   Culture   Final    NO GROWTH 2 DAYS Performed at [facility], [address]  [place], [place] [address]    Report Status PENDING  Incomplete  Blood culture (routine x 2)     Status: None (Preliminary result)   Collection Time: 11/25/18  7:40 PM   Specimen: BLOOD  Result Value Ref Range Status   Specimen Description BLOOD RIGHT ANTECUBITAL  Final   Special Requests   Final    BOTTLES DRAWN AEROBIC AND ANAEROBIC Blood Culture results may not be optimal due to an excessive volume of blood received in culture bottles   Culture   Final    NO GROWTH 2 DAYS Performed at [facility], [address], [place], [place] [address]    Report Status PENDING  Incomplete  SARS Coronavirus 2 by RT PCR (hospital order, performed in [place] hospital lab) Nasopharyngeal Nasopharyngeal Swab     Status: None   Collection Time: 11/26/18 10:03 AM   Specimen: Nasopharyngeal Swab  Result Value Ref Range Status   SARS Coronavirus 2 NEGATIVE NEGATIVE Final    Comment: (NOTE) If result is NEGATIVE SARS-CoV-2 target nucleic acids are NOT DETECTED. The SARS-CoV-2 RNA is generally detectable in upper and lower  respiratory specimens during the acute phase of infection. The lowest  concentration of SARS-CoV-2 viral copies this assay can detect is 250  copies / mL. A negative result does not preclude SARS-CoV-2 infection  and should not be used as the sole basis for treatment or other  patient management decisions.  A negative result may occur with  improper specimen collection / handling, submission of specimen other  than nasopharyngeal swab, presence of viral mutation(s) within the  areas targeted by this assay, and inadequate number of viral copies  (<250 copies / mL). A negative result must be combined with clinical  observations, patient history, and epidemiological information. If result is POSITIVE SARS-CoV-2 target nucleic acids are DETECTED. The SARS-CoV-2 RNA is generally detectable in upper and lower  respiratory specimens dur ing the acute phase of infection.   Positive  results are indicative of active infection with SARS-CoV-2.  Clinical  correlation with patient history and other diagnostic information is  necessary to determine patient infection status.  Positive results do  not rule out bacterial infection or co-infection with other viruses. If result is PRESUMPTIVE POSTIVE SARS-CoV-2 nucleic acids MAY BE PRESENT.   A presumptive positive result was obtained  on the submitted specimen  and confirmed on repeat testing.  While 2019 novel coronavirus  (SARS-CoV-2) nucleic acids may be present in the submitted sample  additional confirmatory testing may be necessary for epidemiological  and / or clinical management purposes  to differentiate between  SARS-CoV-2 and other Sarbecovirus currently known to infect humans.  If clinically indicated additional testing with an alternate test  methodology [phone]) is advised. The SARS-CoV-2 RNA is generally  detectable in upper and lower respiratory sp ecimens during the acute  phase of infection. The expected result is Negative. Fact Sheet for Patients:  StrictlyIdeas.no Fact Sheet for Healthcare Providers: BankingDealers.co.za This test is not yet approved or cleared by the [place] FDA and has been authorized for detection and/or diagnosis of SARS-CoV-2 by FDA under an Emergency Use Authorization (EUA).  This EUA will remain in effect (meaning this test can be used) for the duration of the COVID-19 declaration under Section 564(b)(1) of the Act, 21 U.S.C. section 360bbb-3(b)(1), unless the authorization is terminated or revoked sooner. Performed at [facility], [address] [place], [place] [address]   Respiratory Panel by PCR     Status: None   Collection Time: 11/26/18 11:59 PM   Specimen: Nasopharyngeal Swab; Respiratory  Result Value Ref Range Status   Adenovirus NOT DETECTED NOT DETECTED Final   Coronavirus 229E NOT  DETECTED NOT DETECTED Final    Comment: (NOTE) The Coronavirus on the Respiratory Panel, DOES NOT test for the novel  Coronavirus (2019 nCoV)    Coronavirus HKU1 NOT DETECTED NOT DETECTED Final   Coronavirus NL63 NOT DETECTED NOT DETECTED Final   Coronavirus OC43 NOT DETECTED NOT DETECTED Final   Metapneumovirus NOT DETECTED NOT DETECTED Final   Rhinovirus / Enterovirus NOT DETECTED NOT DETECTED Final   Influenza A NOT DETECTED NOT DETECTED Final   Influenza B NOT DETECTED NOT DETECTED Final   Parainfluenza Virus 1 NOT DETECTED NOT DETECTED Final   Parainfluenza Virus 2 NOT DETECTED NOT DETECTED Final   Parainfluenza Virus 3 NOT DETECTED NOT DETECTED Final   Parainfluenza Virus 4 NOT DETECTED NOT DETECTED Final   Respiratory Syncytial Virus NOT DETECTED NOT DETECTED Final   Bordetella pertussis NOT DETECTED NOT DETECTED Final   Chlamydophila pneumoniae NOT DETECTED NOT DETECTED Final   Mycoplasma pneumoniae NOT DETECTED NOT DETECTED Final    Comment: Performed at [facility], [place]. [address] [place], [place] [address]    Procedures and diagnostic studies:  Ct Angio Chest Pe W And/or Wo Contrast  Result Date: 11/25/2018 CLINICAL DATA:  Shortness of breath beginning 4 weeks ago, gradually worsening. EXAM: CT ANGIOGRAPHY CHEST WITH CONTRAST TECHNIQUE: Multidetector CT imaging of the chest was performed using the standard protocol during bolus administration of intravenous contrast. Multiplanar CT image reconstructions and MIPs were obtained to evaluate the vascular anatomy. CONTRAST:  43mL OMNIPAQUE IOHEXOL 350 MG/ML SOLN COMPARISON:  Current chest radiograph. FINDINGS: Cardiovascular: There is satisfactory opacification of the pulmonary arteries to the segmental level. There is no evidence of a pulmonary embolism. Heart is normal in size. Three-vessel coronary artery calcifications. No pericardial effusion. Great vessels normal in caliber. No aortic dissection. Aortic atherosclerosis.  Mediastinum/Nodes: 9 mm hypoattenuating inferior right thyroid lobe nodule. No neck base or axillary masses or enlarged lymph nodes. Mediastinal adenopathy. Several reference measurements are made. 17 mm short axis prevascular node. Azygos level right paratracheal node measuring 17 mm in short axis. More superior right paratracheal node measuring 14 mm in short axis  subcarinal node measuring 2.1 cm in short axis. Mildly enlarged hilar nodes, 1.6 cm short axis superior right hilar node and a 1.5 cm mid left hilar and 1.8 cm left infrahilar nodes. Trachea and esophagus are unremarkable.  Small hiatal hernia. Lungs/Pleura: Lungs demonstrate coarse heterogeneous interstitial thickening, areas of interspersed cystic change, architectural distortion and intervening areas of ground-glass opacity, the latter most prominent in the lower lobes. Cystic change appears to be underlying paraseptal and mild centrilobular emphysema. Minimal bronchiectasis. No lung mass or suspicious nodule. No pleural effusion or pneumothorax. Upper Abdomen: No acute findings. Prominent gastrohepatic ligament lymph nodes, largest 1 point 2 cm in short axis. Musculoskeletal: No fracture or acute finding. No osteoblastic or osteolytic lesions. Review of the MIP images confirms the above findings. IMPRESSION: 1. No evidence of a pulmonary embolism. 2. Extensive findings of interstitial lung disease include a prominent component of ground-glass opacity, the latter most evident in the lower lobes. There is associated prominent mediastinal and less prominent hilar adenopathy. Mild underlying emphysema. 3. Chronic findings include three-vessel coronary artery calcifications and aortic atherosclerosis. Aortic Atherosclerosis (ICD10-I70.0) and Emphysema (ICD10-J43.9). Electronically Signed   By: Lajean Manes M.D.   On: 11/25/2018 18:24   Dg Chest Port 1 View  Result Date: 11/25/2018 CLINICAL DATA:  Shortness of breath starting 4 weeks ago, gradually  worsening. Former smoker. EXAM: PORTABLE CHEST 1 VIEW COMPARISON:  None. FINDINGS: Borderline cardiomegaly. Patchy bilateral airspace opacities, predominantly peripheral. No pleural effusion or pneumothorax seen. Osseous structures about the chest are unremarkable. IMPRESSION: 1. Borderline cardiomegaly. 2. Bilateral airspace opacities, of uncertain chronicity. Findings could represent chronic interstitial lung disease/fibrosis, acute interstitial edema or multifocal pneumonia. In the absence of fever, suspect some degree of acute edema superimposed on chronic interstitial lung disease/fibrosis. Electronically Signed   By: Franki Cabot M.D.   On: 11/25/2018 16:20    Medications:    aspirin EC  81 mg Oral Daily   brimonidine  1 drop Both Eyes BID   clopidogrel  75 mg Oral Daily   dorzolamide  1 drop Both Eyes BID   enoxaparin (LOVENOX) injection  40 mg Subcutaneous Q24H   furosemide  40 mg Intravenous BID   hydrochlorothiazide  25 mg Oral Daily   insulin aspart  0-9 Units Subcutaneous TID WC   ipratropium-albuterol  3 mL Nebulization QID   latanoprost  1 drop Both Eyes QHS   levofloxacin  750 mg Oral Daily   lisinopril  40 mg Oral Daily   magnesium oxide  400 mg Oral Daily   methylPREDNISolone (SOLU-MEDROL) injection  80 mg Intravenous Q12H   niacin  500 mg Oral QHS   timolol  1 drop Both Eyes BID   Continuous Infusions:  sodium chloride 75 mL/hr at 11/27/18 0300     LOS: 2 days   Elric Tirado  [facility] Pager [phone].   *Please refer to [email or web address], password [email or web address] to get updated schedule on who will round on this patient, as hospitalists switch teams weekly. If 7PM-7AM, please contact night-coverage at [email or web address], password [email or web address] for any overnight needs.  11/27/2018, 1:35 PM

## 2018-11-27 NOTE — Progress Notes (Signed)
Pulmonary Medicine          Date: 11/27/2018,   MRN# [id number] Gerald Clark [birth year]     AdmissionWeight: 72.6 kg                 CurrentWeight: 69.4 kg(taken with bed scale)      CHIEF COMPLAINT:   Acute hypoxemic respiratory failure   SUBJECTIVE    Patient has improved significanlty, he is able to speak in full sentences and reports less dyspnea.  Plan to continue current dose of Solumedrol and COPD carepath. Will work on weaning [place] with spO2 goal 37     PAST MEDICAL HISTORY   Past Medical History:  Diagnosis Date   Arthritis    Asthma    childhood asthma   Cancer ([place])    Basal Cell Skin Cancer   Diabetes mellitus without complication ([place])    GERD (gastroesophageal reflux disease)    Glaucoma    History of chicken pox    Hyperlipemia    Hypertension    Peripheral vascular disease ([place])      SURGICAL HISTORY   Past Surgical History:  Procedure Laterality Date   COLONOSCOPY N/A 06/02/2014   Procedure: COLONOSCOPY;  Surgeon: Josefine Class, MD;  Location: [facility] ENDOSCOPY;  Service: Endoscopy;  Laterality: N/A;   INGUINAL HERNIA REPAIR Left 06/28/2016   Procedure: HERNIA REPAIR INGUINAL ADULT WITH MESH;  Surgeon: Leonie Green, MD;  Location: [facility] ORS;  Service: General;  Laterality: Left;   LOWER EXTREMITY ANGIOGRAPHY Right 10/24/2016   Procedure: Lower Extremity Angiography;  Surgeon: Algernon Huxley, MD;  Location: [facility];  Service: Cardiovascular;  Laterality: Right;     FAMILY HISTORY   Family History  Problem Relation Age of Onset   Hypertension Mother    Hypertension Father    Cancer Father    Breast cancer Sister    Cancer Sister      SOCIAL HISTORY   Social History   Tobacco Use   Smoking status: Former Smoker    Packs/day: 2.00    Types: Cigarettes    Quit date: 04/25/2011    Years since quitting: 7.5   Smokeless tobacco: Never Used  Substance Use Topics   Alcohol use:  Yes    Alcohol/week: 5.0 standard drinks    Types: 5 Cans of beer per week    Comment: occas   Drug use: No     MEDICATIONS    Home Medication:    Current Medication:  Current Facility-Administered Medications:    0.9 %  sodium chloride infusion, , Intravenous, Continuous, Mansy, Jan A, MD, Last Rate: 75 mL/hr at 11/27/18 1600   acetaminophen (TYLENOL) tablet 650 mg, 650 mg, Oral, Q6H PRN **OR** acetaminophen (TYLENOL) suppository 650 mg, 650 mg, Rectal, Q6H PRN, Mansy, Jan A, MD   aspirin EC tablet 81 mg, 81 mg, Oral, Daily, Mansy, Jan A, MD, 81 mg at 11/27/18 0923   brimonidine (ALPHAGAN) 0.2 % ophthalmic solution 1 drop, 1 drop, Both Eyes, BID, Mansy, Jan A, MD, 1 drop at 11/27/18 0933   clopidogrel (PLAVIX) tablet 75 mg, 75 mg, Oral, Daily, Mansy, Jan A, MD, 75 mg at 11/27/18 0924   dorzolamide (TRUSOPT) 2 % ophthalmic solution 1 drop, 1 drop, Both Eyes, BID, Mansy, Jan A, MD, 1 drop at 11/27/18 0933   enoxaparin (LOVENOX) injection 40 mg, 40 mg, Subcutaneous, Q24H, Jennye Boroughs, MD, 40 mg at 11/26/18 2308   furosemide (LASIX) injection 40  mg, 40 mg, Intravenous, BID, Ottie Glazier, MD, 40 mg at 11/27/18 [id number]   hydrochlorothiazide (HYDRODIURIL) tablet 25 mg, 25 mg, Oral, Daily, Mansy, Jan A, MD, 25 mg at 11/27/18 [id number]   insulin aspart (novoLOG) injection 0-9 Units, 0-9 Units, Subcutaneous, TID WC, Mansy, Jan A, MD, 2 Units at 11/27/18 1225   ipratropium-albuterol (DUONEB) 0.5-2.5 (3) MG/3ML nebulizer solution 3 mL, 3 mL, Nebulization, QID, Mansy, Jan A, MD, 3 mL at 11/27/18 1126   latanoprost (XALATAN) 0.005 % ophthalmic solution 1 drop, 1 drop, Both Eyes, QHS, Mansy, Jan A, MD, 1 drop at 11/26/18 2329   levofloxacin (LEVAQUIN) tablet 750 mg, 750 mg, Oral, Daily, Lanney Gins, Cecilia Nishikawa, MD, 750 mg at 11/26/18 1746   lisinopril (ZESTRIL) tablet 40 mg, 40 mg, Oral, Daily, Mansy, Jan A, MD, 40 mg at 11/27/18 [id number]   magnesium hydroxide (MILK OF MAGNESIA) suspension 30 mL,  30 mL, Oral, Daily PRN, Mansy, Jan A, MD   magnesium oxide (MAG-OX) tablet 400 mg, 400 mg, Oral, Daily, Mansy, Jan A, MD, 400 mg at 11/27/18 [id number]   methylPREDNISolone sodium succinate (SOLU-MEDROL) 125 mg/2 mL injection 80 mg, 80 mg, Intravenous, Q12H, Ottie Glazier, MD, 80 mg at 11/27/18 0537   niacin (SLO-NIACIN) CR tablet 500 mg, 500 mg, Oral, QHS, Mansy, Jan A, MD   ondansetron (ZOFRAN) tablet 4 mg, 4 mg, Oral, Q6H PRN **OR** ondansetron (ZOFRAN) injection 4 mg, 4 mg, Intravenous, Q6H PRN, Mansy, Jan A, MD   polyvinyl alcohol (LIQUIFILM TEARS) 1.4 % ophthalmic solution 1 drop, 1 drop, Both Eyes, TID PRN, Mansy, Jan A, MD   timolol (TIMOPTIC) 0.5 % ophthalmic solution 1 drop, 1 drop, Both Eyes, BID, Mansy, Jan A, MD, 1 drop at 11/27/18 0933   traZODone (DESYREL) tablet 25 mg, 25 mg, Oral, QHS PRN, Mansy, Arvella Merles, MD    ALLERGIES   Patient has no known allergies.     REVIEW OF SYSTEMS    Review of Systems:  Gen:  Denies  fever, sweats, chills weigh loss  HEENT: Denies blurred vision, double vision, ear pain, eye pain, hearing loss, nose bleeds, sore throat Cardiac:  No dizziness, chest pain or heaviness, chest tightness,edema Resp:   Denies cough or sputum porduction, shortness of breath,wheezing, hemoptysis,  Gi: Denies swallowing difficulty, stomach pain, nausea or vomiting, diarrhea, constipation, bowel incontinence Gu:  Denies bladder incontinence, burning urine Ext:   Denies Joint pain, stiffness or swelling Skin: Denies  skin rash, easy bruising or bleeding or hives Endoc:  Denies polyuria, polydipsia , polyphagia or weight change Psych:   Denies depression, insomnia or hallucinations   Other:  All other systems negative   VS: BP 133/64 (BP Location: Left Arm)    Pulse 90    Temp 97.8 F (36.6 C)    Resp 20    Ht 5\' 6"  (1.676 m)    Wt 69.4 kg Comment: taken with bed scale   SpO2 94%    BMI 24.69 kg/m      PHYSICAL EXAM    GENERAL:NAD, no fevers, chills, no  weakness no fatigue HEAD: Normocephalic, atraumatic.  EYES: Pupils equal, round, reactive to light. Extraocular muscles intact. No scleral icterus.  MOUTH: Moist mucosal membrane. Dentition intact. No abscess noted.  EAR, NOSE, THROAT: Clear without exudates. No external lesions.  NECK: Supple. No thyromegaly. No nodules. No JVD.  PULMONARY: bilateral rhonchi worse at bases CARDIOVASCULAR: S1 and S2. Regular rate and rhythm. No murmurs, rubs, or gallops. No edema. Pedal pulses 2+ bilaterally.  GASTROINTESTINAL: Soft, nontender, nondistended. No masses. Positive bowel sounds. No hepatosplenomegaly.  MUSCULOSKELETAL: No swelling, clubbing, or edema. Range of motion full in all extremities.  NEUROLOGIC: Cranial nerves II through XII are intact. No gross focal neurological deficits. Sensation intact. Reflexes intact.  SKIN: No ulceration, lesions, rashes, or cyanosis. Skin warm and dry. Turgor intact.  PSYCHIATRIC: Mood, affect within normal limits. The patient is awake, alert and oriented x 3. Insight, judgment intact.       IMAGING    Ct Angio Chest Pe W And/or Wo Contrast  Result Date: 11/25/2018 CLINICAL DATA:  Shortness of breath beginning 4 weeks ago, gradually worsening. EXAM: CT ANGIOGRAPHY CHEST WITH CONTRAST TECHNIQUE: Multidetector CT imaging of the chest was performed using the standard protocol during bolus administration of intravenous contrast. Multiplanar CT image reconstructions and MIPs were obtained to evaluate the vascular anatomy. CONTRAST:  42mL OMNIPAQUE IOHEXOL 350 MG/ML SOLN COMPARISON:  Current chest radiograph. FINDINGS: Cardiovascular: There is satisfactory opacification of the pulmonary arteries to the segmental level. There is no evidence of a pulmonary embolism. Heart is normal in size. Three-vessel coronary artery calcifications. No pericardial effusion. Great vessels normal in caliber. No aortic dissection. Aortic atherosclerosis. Mediastinum/Nodes: 9 mm  hypoattenuating inferior right thyroid lobe nodule. No neck base or axillary masses or enlarged lymph nodes. Mediastinal adenopathy. Several reference measurements are made. 17 mm short axis prevascular node. Azygos level right paratracheal node measuring 17 mm in short axis. More superior right paratracheal node measuring 14 mm in short axis subcarinal node measuring 2.1 cm in short axis. Mildly enlarged hilar nodes, 1.6 cm short axis superior right hilar node and a 1.5 cm mid left hilar and 1.8 cm left infrahilar nodes. Trachea and esophagus are unremarkable.  Small hiatal hernia. Lungs/Pleura: Lungs demonstrate coarse heterogeneous interstitial thickening, areas of interspersed cystic change, architectural distortion and intervening areas of ground-glass opacity, the latter most prominent in the lower lobes. Cystic change appears to be underlying paraseptal and mild centrilobular emphysema. Minimal bronchiectasis. No lung mass or suspicious nodule. No pleural effusion or pneumothorax. Upper Abdomen: No acute findings. Prominent gastrohepatic ligament lymph nodes, largest 1 point 2 cm in short axis. Musculoskeletal: No fracture or acute finding. No osteoblastic or osteolytic lesions. Review of the MIP images confirms the above findings. IMPRESSION: 1. No evidence of a pulmonary embolism. 2. Extensive findings of interstitial lung disease include a prominent component of ground-glass opacity, the latter most evident in the lower lobes. There is associated prominent mediastinal and less prominent hilar adenopathy. Mild underlying emphysema. 3. Chronic findings include three-vessel coronary artery calcifications and aortic atherosclerosis. Aortic Atherosclerosis (ICD10-I70.0) and Emphysema (ICD10-J43.9). Electronically Signed   By: Lajean Manes M.D.   On: 11/25/2018 18:24   Dg Chest Port 1 View  Result Date: 11/25/2018 CLINICAL DATA:  Shortness of breath starting 4 weeks ago, gradually worsening. Former smoker.  EXAM: PORTABLE CHEST 1 VIEW COMPARISON:  None. FINDINGS: Borderline cardiomegaly. Patchy bilateral airspace opacities, predominantly peripheral. No pleural effusion or pneumothorax seen. Osseous structures about the chest are unremarkable. IMPRESSION: 1. Borderline cardiomegaly. 2. Bilateral airspace opacities, of uncertain chronicity. Findings could represent chronic interstitial lung disease/fibrosis, acute interstitial edema or multifocal pneumonia. In the absence of fever, suspect some degree of acute edema superimposed on chronic interstitial lung disease/fibrosis. Electronically Signed   By: Franki Cabot M.D.   On: 11/25/2018 16:20         ASSESSMENT/PLAN   Acute hypoxemic respiratory faiure  -due to acute exacerbation of pulmonary fibrosis  complicated by Asthma and COPD overlap syndrome.   -viral panel stat - negative   - lasix 80mg  X1 followed by 40 bid  - Solumedrol 80mg  BID   - patient is DNR/DNI ,-Pulmicort neublizer therapy   - DuoNEB - caution as patient is severely tachycardic   - empiric antibiotics  - decrease any unnecessary infusions - will switch from IV rocephin and zithromax to PO Levoquin 0000000 daily  - Procalictonin -mildly elevated - blood cultures X2 -negative to date -Arterial blood gas-severe hypoxemia -continue HHFNC with weaning parameters to goal SpO2>88      Thank you for allowing me to participate in the care of this patient.    Patient/Family are satisfied with care plan and all questions have been answered.  This document was prepared using Dragon voice recognition software and may include unintentional dictation errors.     Ottie Glazier, M.D.  Division of [place]

## 2018-11-27 NOTE — Progress Notes (Signed)
Initial Nutrition Assessment  DOCUMENTATION CODES:   Non-severe (moderate) malnutrition in context of chronic illness  INTERVENTION:   Ensure Max protein supplement BID, each supplement provides 150kcal and 30g of protein.   Provide MVI daily.  Provided pt education on high protein/high calorie diet.   NUTRITION DIAGNOSIS:   Moderate Malnutrition related to chronic illness(COPD) as evidenced by moderate muscle depletion, 13.6% weight loss over 3 months.  GOAL:   Patient will meet greater than or equal to 90% of their needs   MONITOR:   PO intake, Supplement acceptance, Labs, Weight trends, I & O's  REASON FOR ASSESSMENT:   Malnutrition Screening Tool    ASSESSMENT:   73 year old pt with PMH of hypertension, hyperlipidemia and diabetes admitted with SOB. Found to have interstitial lung disease, possibly COPD.  Pt was alert and resting in bed wearing a high flow nasal canula. He stated he started eating less to lose weight and believes it got out of hand. He stated he noticed muscle loss and feels he lost most of his weight in the past 5-6 weeks. Pt also stated he has been having increasing difficulties with tasks such as making the bed because of SOB.     Medications Reviewed: Lasix, hydrodiuril, novolog, levaquin, solu-medrol, zestril, NaCl 40ml/hr  Labs Reviewed: A1C (6.2), glucose (192), LDH (375)  NUTRITION - FOCUSED PHYSICAL EXAM:    Most Recent Value  Orbital Region  Unable to assess [Unable to assess due to pt wearing high flow nasal canula]  Upper Arm Region  Moderate depletion  Thoracic and Lumbar Region  Unable to assess  Buccal Region  Unable to assess [unable to assess to due high flow nasal canula]  Temple Region  Unable to assess  Clavicle Bone Region  Mild depletion  Clavicle and Acromion Bone Region  Mild depletion  Scapular Bone Region  Mild depletion  Dorsal Hand  Moderate depletion  Patellar Region  Moderate depletion  Anterior Thigh Region   Moderate depletion  Posterior Calf Region  Moderate depletion  Edema (RD Assessment)  None  Hair  Reviewed  Eyes  Reviewed [Pt had watery eyes]  Mouth  Unable to assess  Skin  Reviewed  Nails  Reviewed       Diet Order:   Diet Order            Diet heart healthy/carb modified Room service appropriate? Yes; Fluid consistency: Thin  Diet effective now              EDUCATION NEEDS:   Education needs have been addressed  Skin:  Skin Assessment: Reviewed RN Assessment(dry, flaky)  Last BM:  11/2  Height:   Ht Readings from Last 1 Encounters:  11/25/18 5\' 6"  (1.676 m)    Weight:   Wt Readings from Last 1 Encounters:  11/27/18 69.4 kg    Ideal Body Weight:  64.5 kg  BMI:  Body mass index is 24.69 kg/m.  Estimated Nutritional Needs:   Kcal:  1800-2100  Protein:  90-105  Fluid:  1.8-2.0L    Allen Norris Dietetic Intern Pager # [phone]

## 2018-11-28 ENCOUNTER — Ambulatory Visit: Payer: Self-pay | Admitting: Urology

## 2018-11-28 DIAGNOSIS — J9601 Acute respiratory failure with hypoxia: Secondary | ICD-10-CM

## 2018-11-28 DIAGNOSIS — E440 Moderate protein-calorie malnutrition: Secondary | ICD-10-CM | POA: Insufficient documentation

## 2018-11-28 LAB — POTASSIUM: Potassium: 3.6 mmol/L (ref 3.5–5.1)

## 2018-11-28 LAB — PROTIME-INR
INR: 1.3 — ABNORMAL HIGH (ref 0.8–1.2)
Prothrombin Time: 15.9 seconds — ABNORMAL HIGH (ref 11.4–15.2)

## 2018-11-28 LAB — GLUCOSE, CAPILLARY
Glucose-Capillary: 187 mg/dL — ABNORMAL HIGH (ref 70–99)
Glucose-Capillary: 357 mg/dL — ABNORMAL HIGH (ref 70–99)
Glucose-Capillary: 202 mg/dL — ABNORMAL HIGH (ref 70–99)
Glucose-Capillary: 200 mg/dL — ABNORMAL HIGH (ref 70–99)

## 2018-11-28 LAB — PROCALCITONIN: Procalcitonin: 0.11 ng/mL

## 2018-11-28 LAB — MAGNESIUM: Magnesium: 2.4 mg/dL (ref 1.7–2.4)

## 2018-11-28 MED ORDER — METHYLPREDNISOLONE SODIUM SUCC 125 MG IJ SOLR
60.0000 mg | Freq: Two times a day (BID) | INTRAMUSCULAR | Status: DC
  Administered 2018-11-28 – 2018-12-01 (×6): 60 mg via INTRAVENOUS
  Filled 2018-11-28 (×6): qty 2

## 2018-11-28 MED ORDER — POTASSIUM CHLORIDE CRYS ER 20 MEQ PO TBCR
40.0000 meq | EXTENDED_RELEASE_TABLET | Freq: Once | ORAL | Status: AC
  Administered 2018-11-28: 40 meq via ORAL
  Filled 2018-11-28: qty 4

## 2018-11-28 NOTE — Progress Notes (Signed)
Received call from tele pt had 45 beat run of V-Tach, HR got up to 180's. Per tele HR now 99, see flow sheet for vitals.  MD Arbutus Ped notified. No orders received.

## 2018-11-28 NOTE — Progress Notes (Signed)
Inpatient Diabetes Program Recommendations  AACE/ADA: New Consensus Statement on Inpatient Glycemic Control   Target Ranges:  Prepandial:   less than 140 mg/dL      Peak postprandial:   less than 180 mg/dL (1-2 hours)      Critically ill patients:  140 - 180 mg/dL   Results for Gerald Clark, Gerald Clark (MRN [id number]) as of 11/28/2018 14:11  Ref. Range 11/27/2018 07:40 11/27/2018 11:51 11/27/2018 17:05 11/27/2018 21:22 11/28/2018 07:35 11/28/2018 11:47  Glucose-Capillary Latest Ref Range: 70 - 99 mg/dL 156 (H) 192 (H) 209 (H) 169 (H) 187 (H) 357 (H)   Review of Glycemic Control  Diabetes history: DM2 Outpatient Diabetes medications: Jardiance 25 mg daily, Glipizide 10 mg BID, Metformin 1000 mg BID, Actos 15 mg daily Current orders for Inpatient glycemic control: Novolog 0-9 units TID with meals; Solumedrol 80 mg Q12H  Inpatient Diabetes Program Recommendations:   Insulin-Correction: Please consider ordering Novolog 0-5 units QHS for bedtime correction.  Insulin-Meal Coverage: If steroids are continued, please consider ordering Novolog 3 units TID with meals for meal coverage if patient eats at least 50% of meals.  Thanks, Barnie Alderman, RN, MSN, CDE Diabetes Coordinator Inpatient Diabetes Program [phone] (Team Pager from 8am to 5pm)

## 2018-11-28 NOTE — Progress Notes (Signed)
Pulmonary Medicine          Date: 11/28/2018,   MRN# [id number] JARRIUS Clark [birth year]     AdmissionWeight: 72.6 kg                 CurrentWeight: 69.4 kg(taken with bed scale)      CHIEF COMPLAINT:   Acute hypoxemic respiratory failure   SUBJECTIVE    Patient has improved significanlty, he is able to speak in full sentences and reports less dyspnea. Will work on weaning [place] currently on 40/50 with spO2 goal 68     PAST MEDICAL HISTORY   Past Medical History:  Diagnosis Date  . Arthritis   . Asthma    childhood asthma  . Cancer ([place])    Basal Cell Skin Cancer  . Diabetes mellitus without complication ([place])   . GERD (gastroesophageal reflux disease)   . Glaucoma   . History of chicken pox   . Hyperlipemia   . Hypertension   . Peripheral vascular disease ([place])      SURGICAL HISTORY   Past Surgical History:  Procedure Laterality Date  . COLONOSCOPY N/A 06/02/2014   Procedure: COLONOSCOPY;  Surgeon: Josefine Class, MD;  Location: [facility] ENDOSCOPY;  Service: Endoscopy;  Laterality: N/A;  . INGUINAL HERNIA REPAIR Left 06/28/2016   Procedure: HERNIA REPAIR INGUINAL ADULT WITH MESH;  Surgeon: Leonie Green, MD;  Location: [facility] ORS;  Service: General;  Laterality: Left;  . LOWER EXTREMITY ANGIOGRAPHY Right 10/24/2016   Procedure: Lower Extremity Angiography;  Surgeon: Algernon Huxley, MD;  Location: [facility];  Service: Cardiovascular;  Laterality: Right;     FAMILY HISTORY   Family History  Problem Relation Age of Onset  . Hypertension Mother   . Hypertension Father   . Cancer Father   . Breast cancer Sister   . Cancer Sister      SOCIAL HISTORY   Social History   Tobacco Use  . Smoking status: Former Smoker    Packs/day: 2.00    Types: Cigarettes    Quit date: 04/25/2011    Years since quitting: 7.6  . Smokeless tobacco: Never Used  Substance Use Topics  . Alcohol use: Yes    Alcohol/week: 5.0 standard drinks   Types: 5 Cans of beer per week    Comment: occas  . Drug use: No     MEDICATIONS    Home Medication:    Current Medication:  Current Facility-Administered Medications:  .  acetaminophen (TYLENOL) tablet 650 mg, 650 mg, Oral, Q6H PRN **OR** acetaminophen (TYLENOL) suppository 650 mg, 650 mg, Rectal, Q6H PRN, Mansy, Jan A, MD .  aspirin EC tablet 81 mg, 81 mg, Oral, Daily, Mansy, Jan A, MD, 81 mg at 11/28/18 0836 .  brimonidine (ALPHAGAN) 0.2 % ophthalmic solution 1 drop, 1 drop, Both Eyes, BID, Mansy, Jan A, MD, 1 drop at 11/28/18 0839 .  clopidogrel (PLAVIX) tablet 75 mg, 75 mg, Oral, Daily, Mansy, Jan A, MD, 75 mg at 11/28/18 0837 .  dorzolamide (TRUSOPT) 2 % ophthalmic solution 1 drop, 1 drop, Both Eyes, BID, Mansy, Jan A, MD, 1 drop at 11/28/18 0840 .  enoxaparin (LOVENOX) injection 40 mg, 40 mg, Subcutaneous, Q24H, Jennye Boroughs, MD, 40 mg at 11/27/18 2231 .  furosemide (LASIX) injection 40 mg, 40 mg, Intravenous, BID, Lanney Gins, Avonne Berkery, MD, 40 mg at 11/28/18 0836 .  hydrochlorothiazide (HYDRODIURIL) tablet 25 mg, 25 mg, Oral, Daily, Mansy, Jan A, MD, 25 mg at 11/28/18  [id number] .  insulin aspart (novoLOG) injection 0-9 Units, 0-9 Units, Subcutaneous, TID WC, Mansy, Arvella Merles, MD, 9 Units at 11/28/18 1230 .  ipratropium-albuterol (DUONEB) 0.5-2.5 (3) MG/3ML nebulizer solution 3 mL, 3 mL, Nebulization, QID, Mansy, Jan A, MD, 3 mL at 11/28/18 1639 .  latanoprost (XALATAN) 0.005 % ophthalmic solution 1 drop, 1 drop, Both Eyes, QHS, Mansy, Jan A, MD, 1 drop at 11/27/18 2234 .  levofloxacin (LEVAQUIN) tablet 750 mg, 750 mg, Oral, Daily, Lanney Gins, Levia Waltermire, MD, 750 mg at 11/27/18 1753 .  lisinopril (ZESTRIL) tablet 40 mg, 40 mg, Oral, Daily, Mansy, Jan A, MD, 40 mg at 11/28/18 0837 .  magnesium hydroxide (MILK OF MAGNESIA) suspension 30 mL, 30 mL, Oral, Daily PRN, Mansy, Jan A, MD .  magnesium oxide (MAG-OX) tablet 400 mg, 400 mg, Oral, Daily, Mansy, Jan A, MD, 400 mg at 11/28/18 0837 .   methylPREDNISolone sodium succinate (SOLU-MEDROL) 125 mg/2 mL injection 60 mg, 60 mg, Intravenous, Q12H, Lanney Gins, Geremiah Fussell, MD .  multivitamin with minerals tablet 1 tablet, 1 tablet, Oral, Daily, Jennye Boroughs, MD, 1 tablet at 11/28/18 0836 .  niacin (SLO-NIACIN) CR tablet 500 mg, 500 mg, Oral, QHS, Mansy, Jan A, MD .  ondansetron (ZOFRAN) tablet 4 mg, 4 mg, Oral, Q6H PRN **OR** ondansetron (ZOFRAN) injection 4 mg, 4 mg, Intravenous, Q6H PRN, Mansy, Jan A, MD .  polyvinyl alcohol (LIQUIFILM TEARS) 1.4 % ophthalmic solution 1 drop, 1 drop, Both Eyes, TID PRN, Mansy, Jan A, MD .  potassium chloride SA (KLOR-CON) CR tablet 40 mEq, 40 mEq, Oral, Once, Nicole Kindred A, DO .  protein supplement (ENSURE MAX) liquid, 11 oz, Oral, BID BM, Jennye Boroughs, MD, 11 oz at 11/28/18 0840 .  timolol (TIMOPTIC) 0.5 % ophthalmic solution 1 drop, 1 drop, Both Eyes, BID, Mansy, Jan A, MD, 1 drop at 11/28/18 0840 .  traZODone (DESYREL) tablet 25 mg, 25 mg, Oral, QHS PRN, Mansy, Arvella Merles, MD    ALLERGIES   Patient has no known allergies.     REVIEW OF SYSTEMS    Review of Systems:  Gen:  Denies  fever, sweats, chills weigh loss  HEENT: Denies blurred vision, double vision, ear pain, eye pain, hearing loss, nose bleeds, sore throat Cardiac:  No dizziness, chest pain or heaviness, chest tightness,edema Resp:   Denies cough or sputum porduction, shortness of breath,wheezing, hemoptysis,  Gi: Denies swallowing difficulty, stomach pain, nausea or vomiting, diarrhea, constipation, bowel incontinence Gu:  Denies bladder incontinence, burning urine Ext:   Denies Joint pain, stiffness or swelling Skin: Denies  skin rash, easy bruising or bleeding or hives Endoc:  Denies polyuria, polydipsia , polyphagia or weight change Psych:   Denies depression, insomnia or hallucinations   Other:  All other systems negative   VS: BP 120/72 (BP Location: Left Arm)   Pulse 100   Temp 98 F (36.7 C)   Resp 18   Ht 5\' 6"   (1.676 m)   Wt 69.4 kg Comment: taken with bed scale  SpO2 92%   BMI 24.69 kg/m      PHYSICAL EXAM    GENERAL:NAD, no fevers, chills, no weakness no fatigue HEAD: Normocephalic, atraumatic.  EYES: Pupils equal, round, reactive to light. Extraocular muscles intact. No scleral icterus.  MOUTH: Moist mucosal membrane. Dentition intact. No abscess noted.  EAR, NOSE, THROAT: Clear without exudates. No external lesions.  NECK: Supple. No thyromegaly. No nodules. No JVD.  PULMONARY: bilateral rhonchi worse at bases CARDIOVASCULAR: S1 and S2. Regular rate  and rhythm. No murmurs, rubs, or gallops. No edema. Pedal pulses 2+ bilaterally.  GASTROINTESTINAL: Soft, nontender, nondistended. No masses. Positive bowel sounds. No hepatosplenomegaly.  MUSCULOSKELETAL: No swelling, clubbing, or edema. Range of motion full in all extremities.  NEUROLOGIC: Cranial nerves II through XII are intact. No gross focal neurological deficits. Sensation intact. Reflexes intact.  SKIN: No ulceration, lesions, rashes, or cyanosis. Skin warm and dry. Turgor intact.  PSYCHIATRIC: Mood, affect within normal limits. The patient is awake, alert and oriented x 3. Insight, judgment intact.       IMAGING    Ct Angio Chest Pe W And/or Wo Contrast  Result Date: 11/25/2018 CLINICAL DATA:  Shortness of breath beginning 4 weeks ago, gradually worsening. EXAM: CT ANGIOGRAPHY CHEST WITH CONTRAST TECHNIQUE: Multidetector CT imaging of the chest was performed using the standard protocol during bolus administration of intravenous contrast. Multiplanar CT image reconstructions and MIPs were obtained to evaluate the vascular anatomy. CONTRAST:  52mL OMNIPAQUE IOHEXOL 350 MG/ML SOLN COMPARISON:  Current chest radiograph. FINDINGS: Cardiovascular: There is satisfactory opacification of the pulmonary arteries to the segmental level. There is no evidence of a pulmonary embolism. Heart is normal in size. Three-vessel coronary artery  calcifications. No pericardial effusion. Great vessels normal in caliber. No aortic dissection. Aortic atherosclerosis. Mediastinum/Nodes: 9 mm hypoattenuating inferior right thyroid lobe nodule. No neck base or axillary masses or enlarged lymph nodes. Mediastinal adenopathy. Several reference measurements are made. 17 mm short axis prevascular node. Azygos level right paratracheal node measuring 17 mm in short axis. More superior right paratracheal node measuring 14 mm in short axis subcarinal node measuring 2.1 cm in short axis. Mildly enlarged hilar nodes, 1.6 cm short axis superior right hilar node and a 1.5 cm mid left hilar and 1.8 cm left infrahilar nodes. Trachea and esophagus are unremarkable.  Small hiatal hernia. Lungs/Pleura: Lungs demonstrate coarse heterogeneous interstitial thickening, areas of interspersed cystic change, architectural distortion and intervening areas of ground-glass opacity, the latter most prominent in the lower lobes. Cystic change appears to be underlying paraseptal and mild centrilobular emphysema. Minimal bronchiectasis. No lung mass or suspicious nodule. No pleural effusion or pneumothorax. Upper Abdomen: No acute findings. Prominent gastrohepatic ligament lymph nodes, largest 1 point 2 cm in short axis. Musculoskeletal: No fracture or acute finding. No osteoblastic or osteolytic lesions. Review of the MIP images confirms the above findings. IMPRESSION: 1. No evidence of a pulmonary embolism. 2. Extensive findings of interstitial lung disease include a prominent component of ground-glass opacity, the latter most evident in the lower lobes. There is associated prominent mediastinal and less prominent hilar adenopathy. Mild underlying emphysema. 3. Chronic findings include three-vessel coronary artery calcifications and aortic atherosclerosis. Aortic Atherosclerosis (ICD10-I70.0) and Emphysema (ICD10-J43.9). Electronically Signed   By: Lajean Manes M.D.   On: 11/25/2018 18:24    Dg Chest Port 1 View  Result Date: 11/27/2018 CLINICAL DATA:  Acute respiratory failure EXAM: PORTABLE CHEST 1 VIEW COMPARISON:  11/25/2018, CT 11/25/2018 FINDINGS: Bilateral reticular and ground-glass opacity, consistent with chronic interstitial lung disease and fibrosis. Emphysematous disease. Ground-glass opacity at the bases without significant change. Stable cardiomediastinal silhouette with aortic atherosclerosis. No pneumothorax IMPRESSION: No significant interval change since 11/25/2018. Extensive reticular and ground-glass opacity corresponding to interstitial lung disease and fibrosis on prior CT. There may be acute superimposed infection or inflammatory change at the lung bases. Similar appearance of cardiomegaly Electronically Signed   By: Donavan Foil M.D.   On: 11/27/2018 17:26   Dg [facility]  Result Date: 11/25/2018 CLINICAL DATA:  Shortness of breath starting 4 weeks ago, gradually worsening. Former smoker. EXAM: PORTABLE CHEST 1 VIEW COMPARISON:  None. FINDINGS: Borderline cardiomegaly. Patchy bilateral airspace opacities, predominantly peripheral. No pleural effusion or pneumothorax seen. Osseous structures about the chest are unremarkable. IMPRESSION: 1. Borderline cardiomegaly. 2. Bilateral airspace opacities, of uncertain chronicity. Findings could represent chronic interstitial lung disease/fibrosis, acute interstitial edema or multifocal pneumonia. In the absence of fever, suspect some degree of acute edema superimposed on chronic interstitial lung disease/fibrosis. Electronically Signed   By: Franki Cabot M.D.   On: 11/25/2018 16:20          ASSESSMENT/PLAN   Acute hypoxemic respiratory faiure  -due to acute exacerbation of pulmonary fibrosis complicated by Asthma and COPD overlap syndrome.   -viral panel stat - negative   - lasix  40 bid- patient is net 4500 cc negative  - Solumedrol 80mg  BID decreased to 60 bid today  - patient is DNR/DNI ,-Pulmicort  neublizer therapy   - DuoNEB - caution as patient is  tachycardic   - empiric antibiotics  - decrease any unnecessary infusions - will switch from IV rocephin and zithromax to PO Levoquin 0000000 daily  - Procalictonin -mildly elevated - blood cultures X2 -negative to date -Arterial blood gas-severe hypoxemia -continue HHFNC with weaning parameters to goal SpO2>88 -serial CXR as above with improvement     Thank you for allowing me to participate in the care of this patient.    Patient/Family are satisfied with care plan and all questions have been answered.  This document was prepared using Dragon voice recognition software and may include unintentional dictation errors.     Ottie Glazier, M.D.  Division of [place]

## 2018-11-28 NOTE — Progress Notes (Addendum)
PROGRESS NOTE    Gerald Clark  [id number] DOB: [birth year] DOA: 11/25/2018  PCP: Adin Hector, MD    LOS - 3   Brief Narrative: 73 y.o. Caucasian male with history of diabetes mellitus, hypertension, dyslipidemia peripheral vascular disease, who presented to the emergency room with acute worsening of dyspnea that had been ongoing for the last 4 weeks.  No associated cough or wheezing, N/V/D, loss of taste or smell.  Subjective chills but afebrile.  No sick contacts or COVID exposure.  In the ED, patient was tachypneic and hypoxic with spO2 72% on room air which improved with NRB mask.  CTA chest ruled out PE, but did show extensive interstitial lung disease including a prominent ground glass opacity in lower lobes, with mediastinal and hilar lymphadenopathy, and mild underlying emphysema.   COVID-19 tested and negative.  He was treated with IV Solu-medrol and IV fluids before admission.  Pulmonology  Consulted.  Patient on high flow nasal cannula and maintains oxygen saturations.     Subjective 11/4: Patient seen sitting up in bed this morning.  States his breathing has improved "almost 100%".  Denies fever/chills.  Reports occasional dry cough.  No acute events reported overnight.  Assessment & Plan:   Principal Problem:   Acute hypoxemic respiratory failure (HCC) Active Problems:   CAP (community acquired pneumonia)   Community acquired pneumonia   Hypoxia   Interstitial lung disease ([place])   Bandemia   Mediastinal adenopathy   Malnutrition of moderate degree   Acute hypoxemic respiratory failure: apparently secondary to exacerbation of underlying, previously undiagnosed(?) interstitial lung disease.   - Pulmonology following. - continue HFNC, goal spO2 > 88%  Interstitial infiltrates ? Community-acquired pneumonia: vs above (interstitial lung disease/pulmonary fibrosis with exacerbation).  Coronavirus test is negative x2.   - cont Solu-medrol 80 mg IV BID - cont PO  Levaquin for possible CA-PNA - pulmonology following  Mediastinal and hilar lymphadenopathy: Follow-up with pulmonologist for further recommendations.  Hypokalemia: Repeat BMP today.  Leukocytosis: Repeat CBC today.  Peripheral vascular disease: Patient is on aspirin and Plavix from home.  Hypertension: BP is okay.  He is on HCTZ and lisinopril.  Type II DM: Metformin, Actos and glipizide have been held for now.  Humalog as needed for hyperglycemia.   ADDENDUM:  Non-sustained VT, noted on telemetry today.  Patient asymptomatic. - K 3.6 and Mg 2.4  - gave 40 mEq oral potassium - maintain K>4.0, Mg>2.0 - keep on telemetry  DVT prophylaxis: Lovenix   Code Status: DNR  Family Communication: none at bedside Disposition Plan:  Pending weaning down O2 and coming off IV steroids.  Estimate d/c home in 3-5 days, pending pulmonary sign off as well.   Consultants:   Pulmonology  Procedures:  none  Antimicrobials:   Levaquin PO    Objective: Vitals:   11/27/18 2343 11/28/18 0738 11/28/18 0804 11/28/18 1220  BP: 123/79 123/69    Pulse: 95 91    Resp: 19 18    Temp: 98.3 F (36.8 C) (!) 97.4 F (36.3 C)    TempSrc:  Oral    SpO2: 96% 93% 92% 92%  Weight:      Height:        Intake/Output Summary (Last 24 hours) at 11/28/2018 1322 Last data filed at 11/28/2018 1149 Gross per 24 hour  Intake 240 ml  Output 2400 ml  Net -2160 ml   Filed Weights   11/25/18 1517 11/27/18 1528  Weight: 72.6 kg 69.4  kg    Examination:  General exam: awake, alert, no acute distress Respiratory system: clear to auscultation bilaterally, no wheezes, rales or rhonchi, normal respiratory effort. Cardiovascular system: normal S1/S2,  RRR, no JVD, murmurs, rubs, gallops, no pedal edema.   Gastrointestinal system: soft, non-tender, non-distended abdomen Central nervous system: alert and oriented x4. no gross focal neurologic deficits, normal speech Extremities: moves all, no edema,  normal tone Skin: dry, intact, normal temperature Psychiatry: normal mood, congruent affect, judgement and insight appear normal    Data Reviewed: I have personally reviewed following labs and imaging studies  CBC: Recent Labs  Lab 11/25/18 1527 11/26/18 1003 11/27/18 1351  WBC 16.4* 25.2* 19.0*  NEUTROABS 13.0*  --  16.5*  HGB 14.4 13.1 13.8  HCT 44.1 41.2 41.8  MCV 85.1 86.9 83.6  PLT 416* 433* [id number]*   Basic Metabolic Panel: Recent Labs  Lab 11/25/18 1527 11/25/18 2236 11/26/18 1003 11/27/18 1351  NA 132*  --  134* 133*  K 3.4*  --  4.0 3.8  CL 95*  --  98 96*  CO2 24  --  21* 23  GLUCOSE 94  --  206* 246*  BUN 17  --  23 40*  CREATININE 1.03  --  0.89 0.99  CALCIUM 9.9  --  9.3 9.3  MG  --  1.9  --   --    GFR: Estimated Creatinine Clearance: 60 mL/min (by C-G formula based on SCr of 0.99 mg/dL). Liver Function Tests: Recent Labs  Lab 11/26/18 1003  AST 38  ALT 18  ALKPHOS 59  BILITOT 1.4*  PROT 8.2*  ALBUMIN 2.9*   No results for input(s): LIPASE, AMYLASE in the last 168 hours. No results for input(s): AMMONIA in the last 168 hours. Coagulation Profile: Recent Labs  Lab 11/28/18 0339  INR 1.3*   Cardiac Enzymes: No results for input(s): CKTOTAL, CKMB, CKMBINDEX, TROPONINI in the last 168 hours. BNP (last 3 results) No results for input(s): PROBNP in the last 8760 hours. HbA1C: Recent Labs    11/25/18 2236  HGBA1C 6.2*   CBG: Recent Labs  Lab 11/27/18 1151 11/27/18 1705 11/27/18 2122 11/28/18 0735 11/28/18 1147  GLUCAP 192* 209* 169* 187* 357*   Lipid Profile: No results for input(s): CHOL, HDL, LDLCALC, TRIG, CHOLHDL, LDLDIRECT in the last 72 hours. Thyroid Function Tests: No results for input(s): TSH, T4TOTAL, FREET4, T3FREE, THYROIDAB in the last 72 hours. Anemia Panel: Recent Labs    11/25/18 2236  FERRITIN 183   Sepsis Labs: Recent Labs  Lab 11/25/18 1527 11/25/18 1735 11/26/18 1638 11/27/18 0409 11/28/18 0339   PROCALCITON  --   --  0.11 0.16 0.11  LATICACIDVEN 2.9* 1.9  --   --   --     Recent Results (from the past 240 hour(s))  SARS Coronavirus 2 by RT PCR (hospital order, performed in [place] hospital lab) Nasopharyngeal Nasopharyngeal Swab     Status: None   Collection Time: 11/25/18  3:27 PM   Specimen: Nasopharyngeal Swab  Result Value Ref Range Status   SARS Coronavirus 2 NEGATIVE NEGATIVE Final    Comment: (NOTE) If result is NEGATIVE SARS-CoV-2 target nucleic acids are NOT DETECTED. The SARS-CoV-2 RNA is generally detectable in upper and lower  respiratory specimens during the acute phase of infection. The lowest  concentration of SARS-CoV-2 viral copies this assay can detect is 250  copies / mL. A negative result does not preclude SARS-CoV-2 infection  and should not be used  as the sole basis for treatment or other  patient management decisions.  A negative result may occur with  improper specimen collection / handling, submission of specimen other  than nasopharyngeal swab, presence of viral mutation(s) within the  areas targeted by this assay, and inadequate number of viral copies  (<250 copies / mL). A negative result must be combined with clinical  observations, patient history, and epidemiological information. If result is POSITIVE SARS-CoV-2 target nucleic acids are DETECTED. The SARS-CoV-2 RNA is generally detectable in upper and lower  respiratory specimens dur ing the acute phase of infection.  Positive  results are indicative of active infection with SARS-CoV-2.  Clinical  correlation with patient history and other diagnostic information is  necessary to determine patient infection status.  Positive results do  not rule out bacterial infection or co-infection with other viruses. If result is PRESUMPTIVE POSTIVE SARS-CoV-2 nucleic acids MAY BE PRESENT.   A presumptive positive result was obtained on the submitted specimen  and confirmed on repeat testing.  While  2019 novel coronavirus  (SARS-CoV-2) nucleic acids may be present in the submitted sample  additional confirmatory testing may be necessary for epidemiological  and / or clinical management purposes  to differentiate between  SARS-CoV-2 and other Sarbecovirus currently known to infect humans.  If clinically indicated additional testing with an alternate test  methodology [phone]) is advised. The SARS-CoV-2 RNA is generally  detectable in upper and lower respiratory sp ecimens during the acute  phase of infection. The expected result is Negative. Fact Sheet for Patients:  StrictlyIdeas.no Fact Sheet for Healthcare Providers: BankingDealers.co.za This test is not yet approved or cleared by the [place] FDA and has been authorized for detection and/or diagnosis of SARS-CoV-2 by FDA under an Emergency Use Authorization (EUA).  This EUA will remain in effect (meaning this test can be used) for the duration of the COVID-19 declaration under Section 564(b)(1) of the Act, 21 U.S.C. section 360bbb-3(b)(1), unless the authorization is terminated or revoked sooner. Performed at [facility], [address] [place], [place] [address]   Blood culture (routine x 2)     Status: None (Preliminary result)   Collection Time: 11/25/18  3:27 PM   Specimen: BLOOD  Result Value Ref Range Status   Specimen Description BLOOD LEFT ANTECUBITAL  Final   Special Requests   Final    BOTTLES DRAWN AEROBIC AND ANAEROBIC Blood Culture adequate volume   Culture   Final    NO GROWTH 3 DAYS Performed at [facility], [address], [place], [place] [address]    Report Status PENDING  Incomplete  Blood culture (routine x 2)     Status: None (Preliminary result)   Collection Time: 11/25/18  7:40 PM   Specimen: BLOOD  Result Value Ref Range Status   Specimen Description BLOOD RIGHT ANTECUBITAL  Final   Special Requests   Final    BOTTLES  DRAWN AEROBIC AND ANAEROBIC Blood Culture results may not be optimal due to an excessive volume of blood received in culture bottles   Culture   Final    NO GROWTH 3 DAYS Performed at [facility], [address] [place], [place] [address]    Report Status PENDING  Incomplete  SARS Coronavirus 2 by RT PCR (hospital order, performed in [place] hospital lab) Nasopharyngeal Nasopharyngeal Swab     Status: None   Collection Time: 11/26/18 10:03 AM   Specimen: Nasopharyngeal Swab  Result Value Ref Range Status  SARS Coronavirus 2 NEGATIVE NEGATIVE Final    Comment: (NOTE) If result is NEGATIVE SARS-CoV-2 target nucleic acids are NOT DETECTED. The SARS-CoV-2 RNA is generally detectable in upper and lower  respiratory specimens during the acute phase of infection. The lowest  concentration of SARS-CoV-2 viral copies this assay can detect is 250  copies / mL. A negative result does not preclude SARS-CoV-2 infection  and should not be used as the sole basis for treatment or other  patient management decisions.  A negative result may occur with  improper specimen collection / handling, submission of specimen other  than nasopharyngeal swab, presence of viral mutation(s) within the  areas targeted by this assay, and inadequate number of viral copies  (<250 copies / mL). A negative result must be combined with clinical  observations, patient history, and epidemiological information. If result is POSITIVE SARS-CoV-2 target nucleic acids are DETECTED. The SARS-CoV-2 RNA is generally detectable in upper and lower  respiratory specimens dur ing the acute phase of infection.  Positive  results are indicative of active infection with SARS-CoV-2.  Clinical  correlation with patient history and other diagnostic information is  necessary to determine patient infection status.  Positive results do  not rule out bacterial infection or co-infection with other viruses. If result is  PRESUMPTIVE POSTIVE SARS-CoV-2 nucleic acids MAY BE PRESENT.   A presumptive positive result was obtained on the submitted specimen  and confirmed on repeat testing.  While 2019 novel coronavirus  (SARS-CoV-2) nucleic acids may be present in the submitted sample  additional confirmatory testing may be necessary for epidemiological  and / or clinical management purposes  to differentiate between  SARS-CoV-2 and other Sarbecovirus currently known to infect humans.  If clinically indicated additional testing with an alternate test  methodology [phone] is advised. The SARS-CoV-2 RNA is generally  detectable in upper and lower respiratory sp ecimens during the acute  phase of infection. The expected result is Negative. Fact Sheet for Patients:  StrictlyIdeas.no Fact Sheet for Healthcare Providers: BankingDealers.co.za This test is not yet approved or cleared by the [place] FDA and has been authorized for detection and/or diagnosis of SARS-CoV-2 by FDA under an Emergency Use Authorization (EUA).  This EUA will remain in effect (meaning this test can be used) for the duration of the COVID-19 declaration under Section 564(b)(1) of the Act, 21 U.S.C. section 360bbb-3(b)(1), unless the authorization is terminated or revoked sooner. Performed at [facility], [address] [place], [place] [address]   Respiratory Panel by PCR     Status: None   Collection Time: 11/26/18 11:59 PM   Specimen: Nasopharyngeal Swab; Respiratory  Result Value Ref Range Status   Adenovirus NOT DETECTED NOT DETECTED Final   Coronavirus 229E NOT DETECTED NOT DETECTED Final    Comment: (NOTE) The Coronavirus on the Respiratory Panel, DOES NOT test for the novel  Coronavirus (2019 nCoV)    Coronavirus HKU1 NOT DETECTED NOT DETECTED Final   Coronavirus NL63 NOT DETECTED NOT DETECTED Final   Coronavirus OC43 NOT DETECTED NOT DETECTED Final    Metapneumovirus NOT DETECTED NOT DETECTED Final   Rhinovirus / Enterovirus NOT DETECTED NOT DETECTED Final   Influenza A NOT DETECTED NOT DETECTED Final   Influenza B NOT DETECTED NOT DETECTED Final   Parainfluenza Virus 1 NOT DETECTED NOT DETECTED Final   Parainfluenza Virus 2 NOT DETECTED NOT DETECTED Final   Parainfluenza Virus 3 NOT DETECTED NOT DETECTED Final   Parainfluenza Virus 4 NOT DETECTED  NOT DETECTED Final   Respiratory Syncytial Virus NOT DETECTED NOT DETECTED Final   Bordetella pertussis NOT DETECTED NOT DETECTED Final   Chlamydophila pneumoniae NOT DETECTED NOT DETECTED Final   Mycoplasma pneumoniae NOT DETECTED NOT DETECTED Final    Comment: Performed at [facility], [address], [place], [place] [address]         Radiology Studies: Dg Chest Port 1 View  Result Date: 11/27/2018 CLINICAL DATA:  Acute respiratory failure EXAM: PORTABLE CHEST 1 VIEW COMPARISON:  11/25/2018, CT 11/25/2018 FINDINGS: Bilateral reticular and ground-glass opacity, consistent with chronic interstitial lung disease and fibrosis. Emphysematous disease. Ground-glass opacity at the bases without significant change. Stable cardiomediastinal silhouette with aortic atherosclerosis. No pneumothorax IMPRESSION: No significant interval change since 11/25/2018. Extensive reticular and ground-glass opacity corresponding to interstitial lung disease and fibrosis on prior CT. There may be acute superimposed infection or inflammatory change at the lung bases. Similar appearance of cardiomegaly Electronically Signed   By: Donavan Foil M.D.   On: 11/27/2018 17:26        Scheduled Meds: . aspirin EC  81 mg Oral Daily  . brimonidine  1 drop Both Eyes BID  . clopidogrel  75 mg Oral Daily  . dorzolamide  1 drop Both Eyes BID  . enoxaparin (LOVENOX) injection  40 mg Subcutaneous Q24H  . furosemide  40 mg Intravenous BID  . hydrochlorothiazide  25 mg Oral Daily  . insulin aspart  0-9 Units  Subcutaneous TID WC  . ipratropium-albuterol  3 mL Nebulization QID  . latanoprost  1 drop Both Eyes QHS  . levofloxacin  750 mg Oral Daily  . lisinopril  40 mg Oral Daily  . magnesium oxide  400 mg Oral Daily  . methylPREDNISolone (SOLU-MEDROL) injection  80 mg Intravenous Q12H  . multivitamin with minerals  1 tablet Oral Daily  . niacin  500 mg Oral QHS  . Ensure Max Protein  11 oz Oral BID BM  . timolol  1 drop Both Eyes BID   Continuous Infusions:   LOS: 3 days    Time spent: 30-35 min    Ezekiel Slocumb, DO [facility] Pager: [phone]  If 7PM-7AM, please contact night-coverage [email or web address] Password [email or web address] 11/28/2018, 1:22 PM

## 2018-11-29 LAB — BASIC METABOLIC PANEL
Anion gap: 10 (ref 5–15)
BUN: 54 mg/dL — ABNORMAL HIGH (ref 8–23)
CO2: 28 mmol/L (ref 22–32)
Calcium: 9.7 mg/dL (ref 8.9–10.3)
Chloride: 94 mmol/L — ABNORMAL LOW (ref 98–111)
Creatinine, Ser: 0.86 mg/dL (ref 0.61–1.24)
GFR calc Af Amer: >60 mL/min (ref >60)
GFR calc non Af Amer: >60 mL/min (ref >60)
Glucose, Bld: 222 mg/dL — ABNORMAL HIGH (ref 70–99)
Potassium: 3.9 mmol/L (ref 3.5–5.1)
Sodium: 132 mmol/L — ABNORMAL LOW (ref 135–145)

## 2018-11-29 LAB — GLUCOSE, CAPILLARY
Glucose-Capillary: 249 mg/dL — ABNORMAL HIGH (ref 70–99)
Glucose-Capillary: 293 mg/dL — ABNORMAL HIGH (ref 70–99)
Glucose-Capillary: 254 mg/dL — ABNORMAL HIGH (ref 70–99)
Glucose-Capillary: 246 mg/dL — ABNORMAL HIGH (ref 70–99)

## 2018-11-29 LAB — CBC
HCT: 43.4 % (ref 39.0–52.0)
Hemoglobin: 14 g/dL (ref 13.0–17.0)
MCH: 26.7 pg (ref 26.0–34.0)
MCHC: 32.3 g/dL (ref 30.0–36.0)
MCV: 82.7 fL (ref 80.0–100.0)
Platelets: 577 10*3/uL — ABNORMAL HIGH (ref 150–400)
RBC: 5.25 MIL/uL (ref 4.22–5.81)
RDW: 14.4 % (ref 11.5–15.5)
WBC: 19.1 10*3/uL — ABNORMAL HIGH (ref 4.0–10.5)
nRBC: 0 % (ref 0.0–0.2)

## 2018-11-29 MED ORDER — POTASSIUM CHLORIDE CRYS ER 20 MEQ PO TBCR
40.0000 meq | EXTENDED_RELEASE_TABLET | Freq: Once | ORAL | Status: AC
  Administered 2018-11-29: 40 meq via ORAL
  Filled 2018-11-29: qty 2

## 2018-11-29 MED ORDER — DOCUSATE SODIUM 100 MG PO CAPS
100.0000 mg | ORAL_CAPSULE | Freq: Two times a day (BID) | ORAL | Status: DC
  Administered 2018-11-29 – 2018-12-04 (×10): 100 mg via ORAL
  Filled 2018-11-29 (×10): qty 1

## 2018-11-29 MED ORDER — INSULIN GLARGINE 100 UNIT/ML ~~LOC~~ SOLN
8.0000 [IU] | Freq: Every day | SUBCUTANEOUS | Status: DC
  Administered 2018-11-29 – 2018-11-30 (×2): 8 [IU] via SUBCUTANEOUS
  Filled 2018-11-29 (×3): qty 0.08

## 2018-11-29 MED ORDER — IPRATROPIUM-ALBUTEROL 0.5-2.5 (3) MG/3ML IN SOLN
3.0000 mL | Freq: Three times a day (TID) | RESPIRATORY_TRACT | Status: DC
  Administered 2018-11-29 – 2018-12-04 (×16): 3 mL via RESPIRATORY_TRACT
  Filled 2018-11-29 (×16): qty 3

## 2018-11-29 MED ORDER — INSULIN ASPART 100 UNIT/ML ~~LOC~~ SOLN
3.0000 [IU] | Freq: Three times a day (TID) | SUBCUTANEOUS | Status: DC
  Administered 2018-11-29 (×3): 3 [IU] via SUBCUTANEOUS
  Filled 2018-11-29 (×3): qty 1

## 2018-11-29 NOTE — Progress Notes (Signed)
PROGRESS NOTE    Gerald Clark  [id number] DOB: 05-Aug-1945 DOA: 11/25/2018  PCP: Adin Hector, MD    LOS - 4   Brief Narrative:  73 y.o.Caucasian malewith history of diabetes mellitus, hypertension, dyslipidemia peripheral vascular disease, who presented to the emergency room with acute worsening of dyspnea that had been ongoing for the last 4 weeks. No associated cough or wheezing, N/V/D, loss of taste or smell.  Subjective chills but afebrile.  No sick contacts or COVID exposure.  In the ED, patient was tachypneic and hypoxic with spO2 72% on room air which improved with NRB mask.  CTA chest ruled out PE, but did show extensive interstitial lung disease including a prominent ground glass opacity in lower lobes, with mediastinal and hilar lymphadenopathy, and mild underlying emphysema.   COVID-19 tested and negative.  He was treated with IV Solu-medrol and IV fluids before admission.  Pulmonology  Consulted.  Patient on high flow nasal cannula and maintaining oxygen saturations.    Subjective 11/5: Patient seen this morning sitting up awake in bed.  No acute events reported overnight.  He reports breathing is getting better.  Has any fevers or chills, cough, chest pain or shortness of breath.  Assessment & Plan:   Principal Problem:   Acute hypoxemic respiratory failure (HCC) Active Problems:   CAP (community acquired pneumonia)   Community acquired pneumonia   Hypoxia   Interstitial lung disease ([place])   Bandemia   Mediastinal adenopathy   Malnutrition of moderate degree  Acute hypoxemic respiratory failure: apparently secondary to exacerbation of underlying, previously undiagnosed(?) interstitial lung disease.   Interstitial infiltrates ? Community-acquired pneumonia: vs above (interstitial lung disease/pulmonary fibrosis with exacerbation).Coronavirus test is negative x2.  - Pulmonology following. - continue HFNC, goal spO2 > 88% - Solu-medrol 60 mg IV BID - per  pulm - cont PO Levaquin for possible CA-PNA - pulmonology following  Non-sustained V-tach (on telemetry 11/4): patient asymptomatic at the time.   - maintain K>4.0, Mg>2.0 - keep on telemetry  Mediastinal and hilar lymphadenopathy: Follow-up with pulmonologist for further recommendations.  Hypokalemia: resolved. Repeat BMP. Maintain K>4.0.  Leukocytosis: secondary to steroids. Monitor with  CBC's.  Peripheral vascular disease:  - continue home aspirin and Plavix   Hypertension: stable.  - continue HCTZ and lisinopril  Type II DM: Metformin, Actos and glipizide have been held for now. Humalog as needed for hyperglycemia.   DVT prophylaxis: Lovenox   Code Status: DNR  Family Communication: none at bedside Disposition Plan:  Pending clinical improvement, wean down oxygen.  Estimate d/c home in 3-5 days.   Consultants:   pulmonology  Procedures:   None    Antimicrobials:   Levaquin PO    Objective: Vitals:   11/28/18 1932 11/28/18 2145 11/29/18 0738 11/29/18 0741  BP:  120/65 (!) 142/74   Pulse: (!) 108 (!) 101 93   Resp:  18 18   Temp:  98 F (36.7 C) 97.6 F (36.4 C)   TempSrc:   Oral   SpO2:  93% 94% 92%  Weight:      Height:        Intake/Output Summary (Last 24 hours) at 11/29/2018 1318 Last data filed at 11/29/2018 1226 Gross per 24 hour  Intake 120 ml  Output 2150 ml  Net -2030 ml   Filed Weights   11/25/18 1517 11/27/18 1528  Weight: 72.6 kg 69.4 kg    Examination:  General exam: awake, alert, no acute distress HEENT: conjunctival  injection left eye, moist mucus membranes, hearing grossly normal Respiratory system: clear to auscultation bilaterally, no wheezes, rales or rhonchi, normal respiratory effort.  On high flow [place]. Cardiovascular system: normal S1/S2, RRR, no JVD, murmurs, rubs, gallops, no pedal edema.   Gastrointestinal system: soft, non-tender, non-distended abdomen, no organomegaly or masses felt, normal bowel sounds.  Central nervous system: alert and oriented x4. no gross focal neurologic deficits, normal speech Extremities: moves all, no edema, normal tone Psychiatry: normal mood, congruent affect, judgement and insight appear normal    Data Reviewed: I have personally reviewed following labs and imaging studies  CBC: Recent Labs  Lab 11/25/18 1527 11/26/18 1003 11/27/18 1351 11/29/18 0406  WBC 16.4* 25.2* 19.0* 19.1*  NEUTROABS 13.0*  --  16.5*  --   HGB 14.4 13.1 13.8 14.0  HCT 44.1 41.2 41.8 43.4  MCV 85.1 86.9 83.6 82.7  PLT 416* 433* 454* 123XX123*   Basic Metabolic Panel: Recent Labs  Lab 11/25/18 1527 11/25/18 2236 11/26/18 1003 11/27/18 1351 11/28/18 1445 11/29/18 0406  NA 132*  --  134* 133*  --  132*  K 3.4*  --  4.0 3.8 3.6 3.9  CL 95*  --  98 96*  --  94*  CO2 24  --  21* 23  --  28  GLUCOSE 94  --  206* 246*  --  222*  BUN 17  --  23 40*  --  54*  CREATININE 1.03  --  0.89 0.99  --  0.86  CALCIUM 9.9  --  9.3 9.3  --  9.7  MG  --  1.9  --   --  2.4  --    GFR: Estimated Creatinine Clearance: 69 mL/min (by C-G formula based on SCr of 0.86 mg/dL). Liver Function Tests: Recent Labs  Lab 11/26/18 1003  AST 38  ALT 18  ALKPHOS 59  BILITOT 1.4*  PROT 8.2*  ALBUMIN 2.9*   No results for input(s): LIPASE, AMYLASE in the last 168 hours. No results for input(s): AMMONIA in the last 168 hours. Coagulation Profile: Recent Labs  Lab 11/28/18 0339  INR 1.3*   Cardiac Enzymes: No results for input(s): CKTOTAL, CKMB, CKMBINDEX, TROPONINI in the last 168 hours. BNP (last 3 results) No results for input(s): PROBNP in the last 8760 hours. HbA1C: No results for input(s): HGBA1C in the last 72 hours. CBG: Recent Labs  Lab 11/28/18 1147 11/28/18 1651 11/28/18 2132 11/29/18 0737 11/29/18 1213  GLUCAP 357* 202* 200* 249* 293*   Lipid Profile: No results for input(s): CHOL, HDL, LDLCALC, TRIG, CHOLHDL, LDLDIRECT in the last 72 hours. Thyroid Function Tests: No  results for input(s): TSH, T4TOTAL, FREET4, T3FREE, THYROIDAB in the last 72 hours. Anemia Panel: No results for input(s): VITAMINB12, FOLATE, FERRITIN, TIBC, IRON, RETICCTPCT in the last 72 hours. Sepsis Labs: Recent Labs  Lab 11/25/18 1527 11/25/18 1735 11/26/18 1638 11/27/18 0409 11/28/18 0339  PROCALCITON  --   --  0.11 0.16 0.11  LATICACIDVEN 2.9* 1.9  --   --   --     Recent Results (from the past 240 hour(s))  SARS Coronavirus 2 by RT PCR (hospital order, performed in [place] hospital lab) Nasopharyngeal Nasopharyngeal Swab     Status: None   Collection Time: 11/25/18  3:27 PM   Specimen: Nasopharyngeal Swab  Result Value Ref Range Status   SARS Coronavirus 2 NEGATIVE NEGATIVE Final    Comment: (NOTE) If result is NEGATIVE SARS-CoV-2 target nucleic acids are NOT  DETECTED. The SARS-CoV-2 RNA is generally detectable in upper and lower  respiratory specimens during the acute phase of infection. The lowest  concentration of SARS-CoV-2 viral copies this assay can detect is 250  copies / mL. A negative result does not preclude SARS-CoV-2 infection  and should not be used as the sole basis for treatment or other  patient management decisions.  A negative result may occur with  improper specimen collection / handling, submission of specimen other  than nasopharyngeal swab, presence of viral mutation(s) within the  areas targeted by this assay, and inadequate number of viral copies  (<250 copies / mL). A negative result must be combined with clinical  observations, patient history, and epidemiological information. If result is POSITIVE SARS-CoV-2 target nucleic acids are DETECTED. The SARS-CoV-2 RNA is generally detectable in upper and lower  respiratory specimens dur ing the acute phase of infection.  Positive  results are indicative of active infection with SARS-CoV-2.  Clinical  correlation with patient history and other diagnostic information is  necessary to determine  patient infection status.  Positive results do  not rule out bacterial infection or co-infection with other viruses. If result is PRESUMPTIVE POSTIVE SARS-CoV-2 nucleic acids MAY BE PRESENT.   A presumptive positive result was obtained on the submitted specimen  and confirmed on repeat testing.  While 2019 novel coronavirus  (SARS-CoV-2) nucleic acids may be present in the submitted sample  additional confirmatory testing may be necessary for epidemiological  and / or clinical management purposes  to differentiate between  SARS-CoV-2 and other Sarbecovirus currently known to infect humans.  If clinically indicated additional testing with an alternate test  methodology [phone]) is advised. The SARS-CoV-2 RNA is generally  detectable in upper and lower respiratory sp ecimens during the acute  phase of infection. The expected result is Negative. Fact Sheet for Patients:  StrictlyIdeas.no Fact Sheet for Healthcare Providers: BankingDealers.co.za This test is not yet approved or cleared by the [place] FDA and has been authorized for detection and/or diagnosis of SARS-CoV-2 by FDA under an Emergency Use Authorization (EUA).  This EUA will remain in effect (meaning this test can be used) for the duration of the COVID-19 declaration under Section 564(b)(1) of the Act, 21 U.S.C. section 360bbb-3(b)(1), unless the authorization is terminated or revoked sooner. Performed at [facility], [address] [place], [place] [address]   Blood culture (routine x 2)     Status: None (Preliminary result)   Collection Time: 11/25/18  3:27 PM   Specimen: BLOOD  Result Value Ref Range Status   Specimen Description BLOOD LEFT ANTECUBITAL  Final   Special Requests   Final    BOTTLES DRAWN AEROBIC AND ANAEROBIC Blood Culture adequate volume   Culture   Final    NO GROWTH 4 DAYS Performed at [facility], [address],  [place],  [address]    Report Status PENDING  Incomplete  Blood culture (routine x 2)     Status: None (Preliminary result)   Collection Time: 11/25/18  7:40 PM   Specimen: BLOOD  Result Value Ref Range Status   Specimen Description BLOOD RIGHT ANTECUBITAL  Final   Special Requests   Final    BOTTLES DRAWN AEROBIC AND ANAEROBIC Blood Culture results may not be optimal due to an excessive volume of blood received in culture bottles   Culture   Final    NO GROWTH 4 DAYS Performed at [facility], [address] [place],  [place] [address]    Report Status PENDING  Incomplete  SARS Coronavirus 2 by RT PCR (hospital order, performed in [facility] hospital lab) Nasopharyngeal Nasopharyngeal Swab     Status: None   Collection Time: 11/26/18 10:03 AM   Specimen: Nasopharyngeal Swab  Result Value Ref Range Status   SARS Coronavirus 2 NEGATIVE NEGATIVE Final    Comment: (NOTE) If result is NEGATIVE SARS-CoV-2 target nucleic acids are NOT DETECTED. The SARS-CoV-2 RNA is generally detectable in upper and lower  respiratory specimens during the acute phase of infection. The lowest  concentration of SARS-CoV-2 viral copies this assay can detect is 250  copies / mL. A negative result does not preclude SARS-CoV-2 infection  and should not be used as the sole basis for treatment or other  patient management decisions.  A negative result may occur with  improper specimen collection / handling, submission of specimen other  than nasopharyngeal swab, presence of viral mutation(s) within the  areas targeted by this assay, and inadequate number of viral copies  (<250 copies / mL). A negative result must be combined with clinical  observations, patient history, and epidemiological information. If result is POSITIVE SARS-CoV-2 target nucleic acids are DETECTED. The SARS-CoV-2 RNA is generally detectable in upper and lower  respiratory specimens dur ing the acute phase of infection.   Positive  results are indicative of active infection with SARS-CoV-2.  Clinical  correlation with patient history and other diagnostic information is  necessary to determine patient infection status.  Positive results do  not rule out bacterial infection or co-infection with other viruses. If result is PRESUMPTIVE POSTIVE SARS-CoV-2 nucleic acids MAY BE PRESENT.   A presumptive positive result was obtained on the submitted specimen  and confirmed on repeat testing.  While 2019 novel coronavirus  (SARS-CoV-2) nucleic acids may be present in the submitted sample  additional confirmatory testing may be necessary for epidemiological  and / or clinical management purposes  to differentiate between  SARS-CoV-2 and other Sarbecovirus currently known to infect humans.  If clinically indicated additional testing with an alternate test  methodology [phone]) is advised. The SARS-CoV-2 RNA is generally  detectable in upper and lower respiratory sp ecimens during the acute  phase of infection. The expected result is Negative. Fact Sheet for Patients:  StrictlyIdeas.no Fact Sheet for Healthcare Providers: BankingDealers.co.za This test is not yet approved or cleared by the [place] FDA and has been authorized for detection and/or diagnosis of SARS-CoV-2 by FDA under an Emergency Use Authorization (EUA).  This EUA will remain in effect (meaning this test can be used) for the duration of the COVID-19 declaration under Section 564(b)(1) of the Act, 21 U.S.C. section 360bbb-3(b)(1), unless the authorization is terminated or revoked sooner. Performed at [facility], [address] [place], [place] [address]   Respiratory Panel by PCR     Status: None   Collection Time: 11/26/18 11:59 PM   Specimen: Nasopharyngeal Swab; Respiratory  Result Value Ref Range Status   Adenovirus NOT DETECTED NOT DETECTED Final   Coronavirus 229E NOT  DETECTED NOT DETECTED Final    Comment: (NOTE) The Coronavirus on the Respiratory Panel, DOES NOT test for the novel  Coronavirus (2019 nCoV)    Coronavirus HKU1 NOT DETECTED NOT DETECTED Final   Coronavirus NL63 NOT DETECTED NOT DETECTED Final   Coronavirus OC43 NOT DETECTED NOT DETECTED Final   Metapneumovirus NOT DETECTED NOT DETECTED Final   Rhinovirus / Enterovirus NOT DETECTED NOT DETECTED Final  Influenza A NOT DETECTED NOT DETECTED Final   Influenza B NOT DETECTED NOT DETECTED Final   Parainfluenza Virus 1 NOT DETECTED NOT DETECTED Final   Parainfluenza Virus 2 NOT DETECTED NOT DETECTED Final   Parainfluenza Virus 3 NOT DETECTED NOT DETECTED Final   Parainfluenza Virus 4 NOT DETECTED NOT DETECTED Final   Respiratory Syncytial Virus NOT DETECTED NOT DETECTED Final   Bordetella pertussis NOT DETECTED NOT DETECTED Final   Chlamydophila pneumoniae NOT DETECTED NOT DETECTED Final   Mycoplasma pneumoniae NOT DETECTED NOT DETECTED Final    Comment: Performed at [facility], [place] [address], [place], [place] [address]         Radiology Studies: Dg Chest Port 1 View  Result Date: 11/27/2018 CLINICAL DATA:  Acute respiratory failure EXAM: PORTABLE CHEST 1 VIEW COMPARISON:  11/25/2018, CT 11/25/2018 FINDINGS: Bilateral reticular and ground-glass opacity, consistent with chronic interstitial lung disease and fibrosis. Emphysematous disease. Ground-glass opacity at the bases without significant change. Stable cardiomediastinal silhouette with aortic atherosclerosis. No pneumothorax IMPRESSION: No significant interval change since 11/25/2018. Extensive reticular and ground-glass opacity corresponding to interstitial lung disease and fibrosis on prior CT. There may be acute superimposed infection or inflammatory change at the lung bases. Similar appearance of cardiomegaly Electronically Signed   By: Donavan Foil M.D.   On: 11/27/2018 17:26        Scheduled Meds: . aspirin EC   81 mg Oral Daily  . brimonidine  1 drop Both Eyes BID  . clopidogrel  75 mg Oral Daily  . dorzolamide  1 drop Both Eyes BID  . enoxaparin (LOVENOX) injection  40 mg Subcutaneous Q24H  . furosemide  40 mg Intravenous BID  . hydrochlorothiazide  25 mg Oral Daily  . insulin aspart  0-9 Units Subcutaneous TID WC  . insulin aspart  3 Units Subcutaneous TID WC  . insulin glargine  8 Units Subcutaneous QHS  . ipratropium-albuterol  3 mL Nebulization TID  . latanoprost  1 drop Both Eyes QHS  . levofloxacin  750 mg Oral Daily  . lisinopril  40 mg Oral Daily  . magnesium oxide  400 mg Oral Daily  . methylPREDNISolone (SOLU-MEDROL) injection  60 mg Intravenous Q12H  . multivitamin with minerals  1 tablet Oral Daily  . niacin  500 mg Oral QHS  . Ensure Max Protein  11 oz Oral BID BM  . timolol  1 drop Both Eyes BID   Continuous Infusions:   LOS: 4 days    Time spent: 30-35    Ezekiel Slocumb, DO [facility] Pager: [phone]  If 7PM-7AM, please contact night-coverage [email or web address] Password [email or web address] 11/29/2018, 1:18 PM

## 2018-11-29 NOTE — Progress Notes (Signed)
Pulmonary Medicine          Date: 11/29/2018,   MRN# [id number] Gerald Clark 1945-06-20     AdmissionWeight: 72.6 kg                 CurrentWeight: 69.4 kg(taken with bed scale)      CHIEF COMPLAINT:   Acute hypoxemic respiratory failure   SUBJECTIVE    Patient improved, wife at bedside.  Supplemental O2 down to 8L/min [place]. No overnight events, will start PT/OT in am.    PAST MEDICAL HISTORY   Past Medical History:  Diagnosis Date  . Arthritis   . Asthma    childhood asthma  . Cancer ([place])    Basal Cell Skin Cancer  . Diabetes mellitus without complication ([place])   . GERD (gastroesophageal reflux disease)   . Glaucoma   . History of chicken pox   . Hyperlipemia   . Hypertension   . Peripheral vascular disease ([place])      SURGICAL HISTORY   Past Surgical History:  Procedure Laterality Date  . COLONOSCOPY N/A 06/02/2014   Procedure: COLONOSCOPY;  Surgeon: Josefine Class, MD;  Location: [facility] END[facility]COPY;  Service: Endoscopy;  Laterality: N/A;  . INGUINAL HERNIA REPAIR Left 06/28/2016   Procedure: HERNIA REPAIR INGUINAL ADULT WITH MESH;  Surgeon: Leonie Green, MD;  Location: [facility] ORS;  Service: General;  Laterality: Left;  . LOWER EXTREMITY ANGIOGRAPHY Right 10/24/2016   Procedure: Lower Extremity Angiography;  Surgeon: Algernon Huxley, MD;  Location: [facility];  Service: Cardiovascular;  Laterality: Right;     FAMILY HISTORY   Family History  Problem Relation Age of Onset  . Hypertension Mother   . Hypertension Father   . Cancer Father   . Breast cancer Sister   . Cancer Sister      SOCIAL HISTORY   Social History   Tobacco Use  . Smoking status: Former Smoker    Packs/day: 2.00    Types: Cigarettes    Quit date: 04/25/2011    Years since quitting: 7.6  . Smokeless tobacco: Never Used  Substance Use Topics  . Alcohol use: Yes    Alcohol/week: 5.0 standard drinks    Types: 5 Cans of beer per week    Comment:  occas  . Drug use: No     MEDICATIONS    Home Medication:    Current Medication:  Current Facility-Administered Medications:  .  acetaminophen (TYLENOL) tablet 650 mg, 650 mg, Oral, Q6H PRN **OR** acetaminophen (TYLENOL) suppository 650 mg, 650 mg, Rectal, Q6H PRN, Mansy, Jan A, MD .  aspirin EC tablet 81 mg, 81 mg, Oral, Daily, Mansy, Jan A, MD, 81 mg at 11/29/18 1016 .  brimonidine (ALPHAGAN) 0.2 % ophthalmic solution 1 drop, 1 drop, Both Eyes, BID, Mansy, Jan A, MD, 1 drop at 11/29/18 1018 .  clopidogrel (PLAVIX) tablet 75 mg, 75 mg, Oral, Daily, Mansy, Jan A, MD, 75 mg at 11/29/18 1016 .  docusate sodium (COLACE) capsule 100 mg, 100 mg, Oral, BID, Nicole Kindred A, DO .  dorzolamide (TRUSOPT) 2 % ophthalmic solution 1 drop, 1 drop, Both Eyes, BID, Mansy, Jan A, MD, 1 drop at 11/29/18 1018 .  enoxaparin (LOVENOX) injection 40 mg, 40 mg, Subcutaneous, Q24H, Jennye Boroughs, MD, 40 mg at 11/28/18 2200 .  furosemide (LASIX) injection 40 mg, 40 mg, Intravenous, BID, Lanney Gins, Lacretia Tindall, MD, 40 mg at 11/29/18 0755 .  hydrochlorothiazide (HYDRODIURIL) tablet 25 mg, 25 mg, Oral,  Daily, Mansy, Jan A, MD, 25 mg at 11/29/18 1016 .  insulin aspart (novoLOG) injection 0-9 Units, 0-9 Units, Subcutaneous, TID WC, Mansy, Arvella Merles, MD, 5 Units at 11/29/18 1247 .  insulin aspart (novoLOG) injection 3 Units, 3 Units, Subcutaneous, TID WC, Black, Lezlie Octave, NP, 3 Units at 11/29/18 1247 .  insulin glargine (LANTUS) injection 8 Units, 8 Units, Subcutaneous, QHS, Griffith, Kelly A, DO .  ipratropium-albuterol (DUONEB) 0.5-2.5 (3) MG/3ML nebulizer solution 3 mL, 3 mL, Nebulization, TID, Arbutus Ped, Kelly A, DO, 3 mL at 11/29/18 1434 .  latanoprost (XALATAN) 0.005 % ophthalmic solution 1 drop, 1 drop, Both Eyes, QHS, Mansy, Jan A, MD, 1 drop at 11/28/18 2203 .  levofloxacin (LEVAQUIN) tablet 750 mg, 750 mg, Oral, Daily, Lanney Gins, Jkayla Spiewak, MD, 750 mg at 11/28/18 1730 .  lisinopril (ZESTRIL) tablet 40 mg, 40 mg, Oral,  Daily, Mansy, Jan A, MD, 40 mg at 11/29/18 1016 .  magnesium hydroxide (MILK OF MAGNESIA) suspension 30 mL, 30 mL, Oral, Daily PRN, Mansy, Jan A, MD .  magnesium oxide (MAG-OX) tablet 400 mg, 400 mg, Oral, Daily, Mansy, Jan A, MD, 400 mg at 11/29/18 1016 .  methylPREDNISolone sodium succinate (SOLU-MEDROL) 125 mg/2 mL injection 60 mg, 60 mg, Intravenous, Q12H, Lanney Gins, Joshua Zeringue, MD, 60 mg at 11/29/18 0534 .  multivitamin with minerals tablet 1 tablet, 1 tablet, Oral, Daily, Jennye Boroughs, MD, 1 tablet at 11/29/18 1016 .  niacin (SLO-NIACIN) CR tablet 500 mg, 500 mg, Oral, QHS, Mansy, Jan A, MD, 500 mg at 11/28/18 2159 .  ondansetron (ZOFRAN) tablet 4 mg, 4 mg, Oral, Q6H PRN **OR** ondansetron (ZOFRAN) injection 4 mg, 4 mg, Intravenous, Q6H PRN, Mansy, Jan A, MD .  polyvinyl alcohol (LIQUIFILM TEARS) 1.4 % ophthalmic solution 1 drop, 1 drop, Both Eyes, TID PRN, Mansy, Jan A, MD .  potassium chloride SA (KLOR-CON) CR tablet 40 mEq, 40 mEq, Oral, Once, Nicole Kindred A, DO .  protein supplement (ENSURE MAX) liquid, 11 oz, Oral, BID BM, Jennye Boroughs, MD, 11 oz at 11/29/18 1024 .  timolol (TIMOPTIC) 0.5 % ophthalmic solution 1 drop, 1 drop, Both Eyes, BID, Mansy, Jan A, MD, 1 drop at 11/29/18 1018 .  traZODone (DESYREL) tablet 25 mg, 25 mg, Oral, QHS PRN, Mansy, Arvella Merles, MD    ALLERGIES   Patient has no known allergies.     REVIEW OF SYSTEMS    Review of Systems:  Gen:  Denies  fever, sweats, chills weigh loss  HEENT: Denies blurred vision, double vision, ear pain, eye pain, hearing loss, nose bleeds, sore throat Cardiac:  No dizziness, chest pain or heaviness, chest tightness,edema Resp:   Denies cough or sputum porduction, shortness of breath,wheezing, hemoptysis,  Gi: Denies swallowing difficulty, stomach pain, nausea or vomiting, diarrhea, constipation, bowel incontinence Gu:  Denies bladder incontinence, burning urine Ext:   Denies Joint pain, stiffness or swelling Skin: Denies   skin rash, easy bruising or bleeding or hives Endoc:  Denies polyuria, polydipsia , polyphagia or weight change Psych:   Denies depression, insomnia or hallucinations   Other:  All other systems negative   VS: BP (!) 142/74 (BP Location: Left Arm)   Pulse 93   Temp 97.6 F (36.4 C) (Oral)   Resp 18   Ht 5\' 6"  (1.676 m)   Wt 69.4 kg Comment: taken with bed scale  SpO2 93%   BMI 24.69 kg/m      PHYSICAL EXAM    GENERAL:NAD, no fevers, chills, no weakness no fatigue  HEAD: Normocephalic, atraumatic.  EYES: Pupils equal, round, reactive to light. Extraocular muscles intact. No scleral icterus.  MOUTH: Moist mucosal membrane. Dentition intact. No abscess noted.  EAR, NOSE, THROAT: Clear without exudates. No external lesions.  NECK: Supple. No thyromegaly. No nodules. No JVD.  PULMONARY: bilateral rhonchi worse at bases improved from previous.  CARDIOVASCULAR: S1 and S2. Regular rate and rhythm. No murmurs, rubs, or gallops. No edema. Pedal pulses 2+ bilaterally.  GASTROINTESTINAL: Soft, nontender, nondistended. No masses. Positive bowel sounds. No hepatosplenomegaly.  MUSCULOSKELETAL: No swelling, clubbing, or edema. Range of motion full in all extremities.  NEUROLOGIC: Cranial nerves II through XII are intact. No gross focal neurological deficits. Sensation intact. Reflexes intact.  SKIN: No ulceration, lesions, rashes, or cyanosis. Skin warm and dry. Turgor intact.  PSYCHIATRIC: Mood, affect within normal limits. The patient is awake, alert and oriented x 3. Insight, judgment intact.       IMAGING    Ct Angio Chest Pe W And/or Wo Contrast  Result Date: 11/25/2018 CLINICAL DATA:  Shortness of breath beginning 4 weeks ago, gradually worsening. EXAM: CT ANGIOGRAPHY CHEST WITH CONTRAST TECHNIQUE: Multidetector CT imaging of the chest was performed using the standard protocol during bolus administration of intravenous contrast. Multiplanar CT image reconstructions and MIPs were  obtained to evaluate the vascular anatomy. CONTRAST:  79mL OMNIPAQUE IOHEXOL 350 MG/ML SOLN COMPARISON:  Current chest radiograph. FINDINGS: Cardiovascular: There is satisfactory opacification of the pulmonary arteries to the segmental level. There is no evidence of a pulmonary embolism. Heart is normal in size. Three-vessel coronary artery calcifications. No pericardial effusion. Great vessels normal in caliber. No aortic dissection. Aortic atherosclerosis. Mediastinum/Nodes: 9 mm hypoattenuating inferior right thyroid lobe nodule. No neck base or axillary masses or enlarged lymph nodes. Mediastinal adenopathy. Several reference measurements are made. 17 mm short axis prevascular node. Azygos level right paratracheal node measuring 17 mm in short axis. More superior right paratracheal node measuring 14 mm in short axis subcarinal node measuring 2.1 cm in short axis. Mildly enlarged hilar nodes, 1.6 cm short axis superior right hilar node and a 1.5 cm mid left hilar and 1.8 cm left infrahilar nodes. Trachea and esophagus are unremarkable.  Small hiatal hernia. Lungs/Pleura: Lungs demonstrate coarse heterogeneous interstitial thickening, areas of interspersed cystic change, architectural distortion and intervening areas of ground-glass opacity, the latter most prominent in the lower lobes. Cystic change appears to be underlying paraseptal and mild centrilobular emphysema. Minimal bronchiectasis. No lung mass or suspicious nodule. No pleural effusion or pneumothorax. Upper Abdomen: No acute findings. Prominent gastrohepatic ligament lymph nodes, largest 1 point 2 cm in short axis. Musculoskeletal: No fracture or acute finding. No osteoblastic or osteolytic lesions. Review of the MIP images confirms the above findings. IMPRESSION: 1. No evidence of a pulmonary embolism. 2. Extensive findings of interstitial lung disease include a prominent component of ground-glass opacity, the latter most evident in the lower lobes.  There is associated prominent mediastinal and less prominent hilar adenopathy. Mild underlying emphysema. 3. Chronic findings include three-vessel coronary artery calcifications and aortic atherosclerosis. Aortic Atherosclerosis (ICD10-I70.0) and Emphysema (ICD10-J43.9). Electronically Signed   By: Lajean Manes M.D.   On: 11/25/2018 18:24   Dg Chest Port 1 View  Result Date: 11/27/2018 CLINICAL DATA:  Acute respiratory failure EXAM: PORTABLE CHEST 1 VIEW COMPARISON:  11/25/2018, CT 11/25/2018 FINDINGS: Bilateral reticular and ground-glass opacity, consistent with chronic interstitial lung disease and fibrosis. Emphysematous disease. Ground-glass opacity at the bases without significant change. Stable cardiomediastinal silhouette with aortic  atherosclerosis. No pneumothorax IMPRESSION: No significant interval change since 11/25/2018. Extensive reticular and ground-glass opacity corresponding to interstitial lung disease and fibrosis on prior CT. There may be acute superimposed infection or inflammatory change at the lung bases. Similar appearance of cardiomegaly Electronically Signed   By: Donavan Foil M.D.   On: 11/27/2018 17:26   Dg Chest Port 1 View  Result Date: 11/25/2018 CLINICAL DATA:  Shortness of breath starting 4 weeks ago, gradually worsening. Former smoker. EXAM: PORTABLE CHEST 1 VIEW COMPARISON:  None. FINDINGS: Borderline cardiomegaly. Patchy bilateral airspace opacities, predominantly peripheral. No pleural effusion or pneumothorax seen. Osseous structures about the chest are unremarkable. IMPRESSION: 1. Borderline cardiomegaly. 2. Bilateral airspace opacities, of uncertain chronicity. Findings could represent chronic interstitial lung disease/fibrosis, acute interstitial edema or multifocal pneumonia. In the absence of fever, suspect some degree of acute edema superimposed on chronic interstitial lung disease/fibrosis. Electronically Signed   By: Franki Cabot M.D.   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   -viral panel stat - negative   - lasix  40 bid- patient is net 6.2 cc negative  - Solumedrol 80mg  BID decreased to 60 bid today  - patient is DNR/DNI ,-Pulmicort neublizer therapy   - DuoNEB - caution as patient is  tachycardic   - empiric antibiotics  - decrease any unnecessary infusions - will switch from IV rocephin and zithromax to PO Levoquin 0000000 daily  - Procalictonin -mildly elevated - blood cultures X2 -negative to date -Arterial blood gas-severe hypoxemia -continue [place] with weaning parameters to goal SpO2>88 -serial CXR as above with improvement     Thank you for allowing me to participate in the care of this patient.    Patient/Family are satisfied with care plan and all questions have been answered.  This document was prepared using Dragon voice recognition software and may include unintentional dictation errors.     Ottie Glazier, M.D.  Division of [place]

## 2018-11-29 NOTE — Progress Notes (Addendum)
Inpatient Diabetes Program Recommendations  AACE/ADA: New Consensus Statement on Inpatient Glycemic Control   Target Ranges:  Prepandial:   less than 140 mg/dL      Peak postprandial:   less than 180 mg/dL (1-2 hours)      Critically ill patients:  140 - 180 mg/dL   Results for ROBERTT, WHITTY (MRN [id number]) as of 11/29/2018 08:41  Ref. Range 11/28/2018 07:35 11/28/2018 11:47 11/28/2018 16:51 11/28/2018 21:32 11/29/2018 07:37  Glucose-Capillary Latest Ref Range: 70 - 99 mg/dL 187 (H) 357 (H) 202 (H) 200 (H) 249 (H)   Review of Glycemic Control  Diabetes history: DM2 Outpatient Diabetes medications: Jardiance 25 mg daily, Glipizide 10 mg BID, Metformin 1000 mg BID, Actos 15 mg daily Current orders for Inpatient glycemic control: Novolog 0-9 units TID with meals, Novolog 3 units TID with meals; Solumedrol 60 mg Q12H  Inpatient Diabetes Program Recommendations:   Insulin-Correction: Please consider ordering Novolog 0-5 units QHS for bedtime correction.  Insulin-Meal Coverage: Noted Novolog 3 units TID with meals for meal coverage was ordered this morning.  Thanks, Barnie Alderman, RN, MSN, CDE Diabetes Coordinator Inpatient Diabetes Program [phone] (Team Pager from 8am to 5pm)

## 2018-11-29 NOTE — Progress Notes (Signed)
Patient transitioned to HFNC @ 8LPM.

## 2018-11-30 LAB — CBC WITH DIFFERENTIAL/PLATELET
Abs Immature Granulocytes: 0.23 10*3/uL — ABNORMAL HIGH (ref 0.00–0.07)
Basophils Absolute: 0.1 10*3/uL (ref 0.0–0.1)
Basophils Relative: 0 %
Eosinophils Absolute: 0 10*3/uL (ref 0.0–0.5)
Eosinophils Relative: 0 %
HCT: 45.1 % (ref 39.0–52.0)
Hemoglobin: 14.5 g/dL (ref 13.0–17.0)
Immature Granulocytes: 1 %
Lymphocytes Relative: 10 %
Lymphs Abs: 1.9 10*3/uL (ref 0.7–4.0)
MCH: 26.8 pg (ref 26.0–34.0)
MCHC: 32.2 g/dL (ref 30.0–36.0)
MCV: 83.2 fL (ref 80.0–100.0)
Monocytes Absolute: 1.8 10*3/uL — ABNORMAL HIGH (ref 0.1–1.0)
Monocytes Relative: 9 %
Neutro Abs: 15.5 10*3/uL — ABNORMAL HIGH (ref 1.7–7.7)
Neutrophils Relative %: 80 %
Platelets: 537 10*3/uL — ABNORMAL HIGH (ref 150–400)
RBC: 5.42 MIL/uL (ref 4.22–5.81)
RDW: 14.3 % (ref 11.5–15.5)
WBC: 19.6 10*3/uL — ABNORMAL HIGH (ref 4.0–10.5)
nRBC: 0 % (ref 0.0–0.2)

## 2018-11-30 LAB — BASIC METABOLIC PANEL
Anion gap: 10 (ref 5–15)
BUN: 68 mg/dL — ABNORMAL HIGH (ref 8–23)
CO2: 27 mmol/L (ref 22–32)
Calcium: 9.3 mg/dL (ref 8.9–10.3)
Chloride: 94 mmol/L — ABNORMAL LOW (ref 98–111)
Creatinine, Ser: 1 mg/dL (ref 0.61–1.24)
GFR calc Af Amer: >60 mL/min (ref >60)
GFR calc non Af Amer: >60 mL/min (ref >60)
Glucose, Bld: 222 mg/dL — ABNORMAL HIGH (ref 70–99)
Potassium: 4.1 mmol/L (ref 3.5–5.1)
Sodium: 131 mmol/L — ABNORMAL LOW (ref 135–145)

## 2018-11-30 LAB — GLUCOSE, CAPILLARY
Glucose-Capillary: 242 mg/dL — ABNORMAL HIGH (ref 70–99)
Glucose-Capillary: 355 mg/dL — ABNORMAL HIGH (ref 70–99)
Glucose-Capillary: 209 mg/dL — ABNORMAL HIGH (ref 70–99)
Glucose-Capillary: 179 mg/dL — ABNORMAL HIGH (ref 70–99)

## 2018-11-30 LAB — MAGNESIUM: Magnesium: 2.7 mg/dL — ABNORMAL HIGH (ref 1.7–2.4)

## 2018-11-30 MED ORDER — INSULIN ASPART 100 UNIT/ML ~~LOC~~ SOLN
9.0000 [IU] | Freq: Three times a day (TID) | SUBCUTANEOUS | Status: DC
  Administered 2018-12-01 (×2): 9 [IU] via SUBCUTANEOUS
  Filled 2018-11-30 (×2): qty 1

## 2018-11-30 MED ORDER — INSULIN ASPART 100 UNIT/ML ~~LOC~~ SOLN
6.0000 [IU] | Freq: Three times a day (TID) | SUBCUTANEOUS | Status: DC
  Administered 2018-11-30 (×3): 6 [IU] via SUBCUTANEOUS
  Filled 2018-11-30 (×3): qty 1

## 2018-11-30 NOTE — Evaluation (Signed)
Occupational Therapy Evaluation Patient Details Name: Gerald Clark MRN: [id number] DOB: Dec 01, 1945 Today's Date: 11/30/2018    History of Present Illness presented to ER secondary to worsening SOB; admitted for management of acute hypoxic respiratory failure due to CAP.   Clinical Impression   Gerald Clark was seen for OT evaluation this date. Pt was independent in all ADLs and mobility, living in a 1 story home with his wife. Pt denies use of supplemental oxygen in the home but reports becoming easily fatigued or out of breath with minimal exertion. Pt currently requires min assist for functional mobility and heavy ADL mgt including bathing and dressing tasks due to poor activity tolerance and increased shortness of breath with exertion. Pt educated in energy conservation conservation strategies including pursed lip breathing, activity pacing, home/routines modifications, work simplification, AE/DME, prioritizing of meaningful occupations, and falls prevention. Handout provided. Pt verbalized understanding but would benefit from additional skilled OT services to maximize recall and carryover of learned techniques and facilitate implementation of learned techniques into daily routines. Upon discharge, recommend [place] services.       Follow Up Recommendations  Home health OT    Equipment Recommendations  3 in 1 bedside commode    Recommendations for Other Services       Precautions / Restrictions Precautions Precautions: Fall Precaution Comments: Moderate Fall Restrictions Weight Bearing Restrictions: No      Mobility Bed Mobility Overal bed mobility: Modified Independent                Transfers Overall transfer level: Needs assistance Equipment used: Rolling walker (2 wheeled) Transfers: Sit to/from Stand Sit to Stand: Min guard;Min assist         General transfer comment: cuing for hand placement; increased time/effort for lift off    Balance Overall balance  assessment: Needs assistance Sitting-balance support: No upper extremity supported;Feet supported Sitting balance-Leahy Scale: Good     Standing balance support: Bilateral upper extremity supported Standing balance-Leahy Scale: Fair                             ADL either performed or assessed with clinical judgement   ADL                                         General ADL Comments: Pt significantly limited by cardiopulmonary status. Requires at least supervision level assist for ADL mgt including bathing and dressing in a seated position due to increased fatigue and shortness of breath with exertion. Pt on 10 L HFNC during OT evalution with O2 sats in 90's.     Vision Baseline Vision/History: Wears glasses Wears Glasses: At all times Patient Visual Report: No change from baseline       Perception     Praxis      Pertinent Vitals/Pain Pain Assessment: 0-10 Pain Score: 6  Pain Location: Low back Pain Descriptors / Indicators: Sore;Aching Pain Intervention(s): Limited activity within patient's tolerance;Monitored during session;Repositioned;Patient requesting pain meds-RN notified     Hand Dominance Right   Extremity/Trunk Assessment Upper Extremity Assessment Upper Extremity Assessment: Overall WFL for tasks assessed   Lower Extremity Assessment Lower Extremity Assessment: Defer to PT evaluation;Overall WFL for tasks assessed       Communication Communication Communication: No difficulties   Cognition Arousal/Alertness: Awake/alert Behavior During Therapy: WFL for tasks assessed/performed Overall  Cognitive Status: Within Functional Limits for tasks assessed                                     General Comments  Upon OT arrival to pt room pt SpO2 at 90% in 10L with HFNC in place. With PLB and repositioning HOB from 25 to 30 degrees, pt SpO2 rebounded quickly to 98 but did not maintain. Remained between 93-94 t/o remainder  of session.    Exercises Other Exercises Other Exercises: Sit/stand and static stance with RW, cga/min assist, to use urinal.  Fair stability, but limited ability to move outside of immediate BOS without unilateral UE stabilization. Other Exercises: Reviewed and encouraged pursed lip breathing and use of incentive spirometer; patient voiced understanding. Other Exercises: Pt educated in falls prevention strategies, energy conservation strategies including pursed lip breathing, safe use of AE/DME for ADL mgt, and the "4 P's" with handout provided. Pt verbalizes undertanding but would benefit from reinforcement of education provided.   Shoulder Instructions      Home Living Family/patient expects to be discharged to:: Private residence Living Arrangements: Spouse/significant other Available Help at Discharge: Family;Available 24 hours/day Type of Home: House Home Access: Stairs to enter [employer] of Steps: 1 Entrance Stairs-Rails: None Home Layout: One level     Bathroom Shower/Tub: Corporate investment banker: Standard     Home Equipment: None          Prior Functioning/Environment Level of Independence: Independent        Comments: Indep with ADLs, household and community mobilization without assist device; + driving; no falls; no home O2.  Enjoys fishing.        OT Problem List: Decreased strength;Decreased coordination;Cardiopulmonary status limiting activity;Pain;Decreased range of motion;Decreased activity tolerance;Decreased safety awareness;Impaired balance (sitting and/or standing);Decreased knowledge of use of DME or AE      OT Treatment/Interventions: Self-care/ADL training;Balance training;Therapeutic exercise;Therapeutic activities;Energy conservation;DME and/or AE instruction;Patient/family education    OT Goals(Current goals can be found in the care plan section) Acute Rehab OT Goals Patient Stated Goal: to return home and to go  fishing OT Goal Formulation: With patient Time For Goal Achievement: 12/14/18 Potential to Achieve Goals: Good ADL Goals Additional ADL Goal #1: Pt will independently verbalize a plan to implement at least 3 learned energy conservation strategies into his daily routines/home environment for improved safety and functional independence upon hospital DC.  OT Frequency: Min 1X/week   Barriers to D/C:            Co-evaluation              AM-PAC OT "6 Clicks" Daily Activity     Outcome Measure Help from another person eating meals?: None Help from another person taking care of personal grooming?: A Little Help from another person toileting, which includes using toliet, bedpan, or urinal?: A Little Help from another person bathing (including washing, rinsing, drying)?: A Little Help from another person to put on and taking off regular upper body clothing?: A Little Help from another person to put on and taking off regular lower body clothing?: A Little 6 Click Score: 19   End of Session    Activity Tolerance: Patient tolerated treatment well Patient left: in bed;with call bell/phone within reach;with bed alarm set;with SCD's reapplied;Other (comment)(with HFNC in place on 10L; with MD in room.)  OT Visit Diagnosis: Other abnormalities of gait and mobility (R26.89);Pain  Pain - Right/Left: (both) Pain - part of body: (back)                Time: [id number] OT Time Calculation (min): 21 min Charges:  OT General Charges $OT Visit: 1 Visit OT Evaluation $OT Eval Low Complexity: 1 Low OT Treatments $Self Care/Home Management : 8-22 mins  Shara Blazing, M.S., OTR/L Ascom: [phone] 11/30/18, 1:14 PM

## 2018-11-30 NOTE — Evaluation (Signed)
Physical Therapy Evaluation Patient Details Name: ESTEVE YAFFE MRN: [id number] DOB: 10-19-1945 Today's Date: 11/30/2018   History of Present Illness  presented to ER secondary to worsening SOB; admitted for management of acute hypoxic respiratory failure due to CAP.  Clinical Impression  Upon evaluation, patient alert and oriented; follows commands and eager for OOB activities as appropriate.  Bilat UE/LE strength and ROM grossly symmetrical and WFL; no focal weakness noted, denies pain.  Able to complete bed mobility indep; sit/stand, basic transfers and gait (25') with RW, cga/min assist.  Demonstrates short, choppy steps with decreased cadence/gait speed; emphasis on pursed lip breathing with exertion.  Sats 84% on 10L HFNC with exertion, recovering to 88-89% with seated rest.  Left on 10L HFNC end of session; RN informed/aware.  Anticipate consistent mobility progression as cardiopulmonary status optimized. Would benefit from skilled PT to address above deficits and promote optimal return to PLOF.; Recommend transition to HHPT upon discharge from acute hospitalization.      Follow Up Recommendations Home health PT    Equipment Recommendations  Rolling walker with 5" wheels    Recommendations for Other Services       Precautions / Restrictions Precautions Precautions: Fall Restrictions Weight Bearing Restrictions: No      Mobility  Bed Mobility Overal bed mobility: Modified Independent                Transfers Overall transfer level: Needs assistance Equipment used: Rolling walker (2 wheeled) Transfers: Sit to/from Stand Sit to Stand: Min guard;Min assist         General transfer comment: cuing for hand placement; increased time/effort for lift off  Ambulation/Gait Ambulation/Gait assistance: Min guard Gait Distance (Feet): 25 Feet Assistive device: Rolling walker (2 wheeled)       General Gait Details: short, choppy steps with decreased cadence/gait  speed; emphasis on pursed lip breathing with exertion.  Sats 84% on 10L HFNC with exertion, recovering to 88-89% with seated rest.  Stairs            Wheelchair Mobility    Modified Rankin (Stroke Patients Only)       Balance Overall balance assessment: Needs assistance Sitting-balance support: No upper extremity supported;Feet supported Sitting balance-Leahy Scale: Good     Standing balance support: Bilateral upper extremity supported Standing balance-Leahy Scale: Fair                               Pertinent Vitals/Pain Pain Assessment: No/denies pain    Home Living Family/patient expects to be discharged to:: Private residence Living Arrangements: Spouse/significant other Available Help at Discharge: Family Type of Home: House Home Access: Stairs to enter Entrance Stairs-Rails: None Entrance Stairs-Number of Steps: 1 Home Layout: One level Home Equipment: None      Prior Function Level of Independence: Independent         Comments: Indep with ADLs, household and community mobilization without assist device; + driving; no falls; no home O2.  Enjoys fishing.     Hand Dominance        Extremity/Trunk Assessment   Upper Extremity Assessment Upper Extremity Assessment: Overall WFL for tasks assessed    Lower Extremity Assessment Lower Extremity Assessment: Overall WFL for tasks assessed       Communication   Communication: No difficulties  Cognition Arousal/Alertness: Awake/alert Behavior During Therapy: WFL for tasks assessed/performed Overall Cognitive Status: Within Functional Limits for tasks assessed  General Comments      Exercises Other Exercises Other Exercises: Sit/stand and static stance with RW, cga/min assist, to use urinal.  Fair stability, but limited ability to move outside of immediate BOS without unilateral UE stabilization. Other Exercises: Reviewed and  encouraged pursed lip breathing and use of incentive spirometer; patient voiced understanding.   Assessment/Plan    PT Assessment Patient needs continued PT services  PT Problem List Decreased activity tolerance;Decreased balance;Decreased mobility;Decreased knowledge of use of DME;Decreased safety awareness;Decreased knowledge of precautions;Cardiopulmonary status limiting activity       PT Treatment Interventions DME instruction;Gait training;Stair training;Functional mobility training;Therapeutic activities;Therapeutic exercise;Balance training;Patient/family education    PT Goals (Current goals can be found in the Care Plan section)  Acute Rehab PT Goals Patient Stated Goal: to return home and to go fishing PT Goal Formulation: With patient Time For Goal Achievement: 12/14/18 Potential to Achieve Goals: Good    Frequency Min 2X/week   Barriers to discharge        Co-evaluation               AM-PAC PT "6 Clicks" Mobility  Outcome Measure Help needed turning from your back to your side while in a flat bed without using bedrails?: None Help needed moving from lying on your back to sitting on the side of a flat bed without using bedrails?: None Help needed moving to and from a bed to a chair (including a wheelchair)?: A Little Help needed standing up from a chair using your arms (e.g., wheelchair or bedside chair)?: A Little Help needed to walk in hospital room?: A Little Help needed climbing 3-5 steps with a railing? : A Little 6 Click Score: 20    End of Session Equipment Utilized During Treatment: Gait belt Activity Tolerance: Patient tolerated treatment well Patient left: in chair;with call bell/phone within reach;with chair alarm set Nurse Communication: Mobility status PT Visit Diagnosis: Muscle weakness (generalized) (M62.81);Difficulty in walking, not elsewhere classified (R26.2)    Time: [id number] PT Time Calculation (min) (ACUTE ONLY): 29 min   Charges:    PT Evaluation $PT Eval High Complexity: 1 High PT Treatments $Therapeutic Activity: 8-22 mins       Gwenlyn Hottinger H. Owens Shark, PT, DPT, NCS 11/30/18, 10:22 AM [phone]

## 2018-11-30 NOTE — TOC Progression Note (Signed)
Transition of Care [facility]) - Progression Note    Patient Details  Name: Gerald Clark MRN: [id number] Date of Birth: 1945/07/21  Transition of [facility] CM/SW B[place], RN Phone Number: 11/30/2018, 8:31 AM  Clinical Narrative:    Patient is now on 8 Liters O2 [employer], continuing to wean, CM to continue to Monitor for needs and possible Home O2        Expected Discharge Plan and Services                                                 Social Determinants of Health (SDOH) Interventions    Readmission Risk Interventions No flowsheet data found.

## 2018-11-30 NOTE — Progress Notes (Signed)
PROGRESS NOTE    Gerald Clark  [id number] DOB: 1945/05/10 DOA: 11/25/2018  PCP: Adin Hector, MD    LOS - 5   Brief Narrative:  73 y.o.Caucasian malewith history of diabetes mellitus, hypertension, dyslipidemia peripheral vascular disease, who presented to the emergency room with acuteworseningof dyspneathat had been ongoing forthe last 4 weeks.No associated cough or wheezing, N/V/D, loss of taste or smell. Subjective chills but afebrile. No sick contacts or COVID exposure. In the ED, patient was tachypneic and hypoxic with spO2 72% on room air which improved with NRB mask. CTA chest ruled out PE, but did show extensive interstitial lung disease including a prominent ground glass opacity in lower lobes, with mediastinal and hilar lymphadenopathy, and mild underlying emphysema. COVID-19 tested and negative. He was treated with IV Solu-medrol and IV fluids before admission. Pulmonology Consulted. Patient on high flow nasal cannula and maintaining oxygen saturations.   Subjective 11/6: Patient seen awake and sitting up in bed.  Reports feeling well overall, but had a single episode of emesis earlier this morning.  Denies any nausea currently.  Denies fever/chills, abdominal pain or diarrhea.  States breathing is improving.  Assessment & Plan:   Principal Problem:   Acute hypoxemic respiratory failure (HCC) Active Problems:   CAP (community acquired pneumonia)   Community acquired pneumonia   Hypoxia   Interstitial lung disease ([place])   Bandemia   Mediastinal adenopathy   Malnutrition of moderate degree  Acute hypoxemic respiratory failure:apparently secondary to exacerbation of underlying, previously undiagnosed(?) interstitial lung disease.  Interstitial infiltrates? Community-acquired pneumonia:vs above (interstitial lung disease/pulmonary fibrosis with exacerbation).Coronavirus test is negative x2. - Pulmonology following. - continue HFNC, goal  spO2 > 88% - Solu-medrol 60 mg IV BID - per pulm - cont POLevaquinfor possible CA-PNA  Non-sustained V-tach (on telemetry 11/4): patient asymptomatic at the time.   - maintain K>4.0, Mg>2.0 - keep on telemetry  Mediastinal and hilar lymphadenopathy: Follow-up with pulmonologist for further recommendations.  Hypokalemia: resolved. Repeat BMP. Maintain K>4.0.  Leukocytosis: secondary to steroids. Monitor with  CBC's.  Peripheral vascular disease:  - continue home aspirin and Plavix   Hypertension: stable.  - continue HCTZ and lisinopril  Type II DM:  Hyperglycemia secondary to steroids Metformin, Actos and glipizide have been held for now - increased short acting with meals to 6 units - added Lantus - continue sliding scale insulin   DVT prophylaxis: Lovenox   Code Status: DNR  Family Communication: none at bedside Disposition Plan:  Pending clinical improvement, wean down oxygen.  Estimate d/c home in 3-5 days.   Consultants:   pulmonology  Procedures:   None                  Antimicrobials:   Levaquin PO     Objective: Vitals:   11/30/18 0746 11/30/18 0801 11/30/18 0814 11/30/18 1014  BP:   106/60   Pulse:   98   Resp:   16   Temp:   (!) 97.5 F (36.4 C)   TempSrc:   Axillary   SpO2: 99% 93% 94% (!) 89%  Weight:      Height:        Intake/Output Summary (Last 24 hours) at 11/30/2018 1218 Last data filed at 11/30/2018 0300 Gross per 24 hour  Intake 240 ml  Output 1750 ml  Net -1510 ml   Filed Weights   11/25/18 1517 11/27/18 1528  Weight: 72.6 kg 69.4 kg    Examination:  General exam:  awake, alert, no acute distress Respiratory system: decreased breath sounds with mild rhonchi at bases, no wheezes or rales. Cardiovascular system: normal S1/S2, RRR, no JVD, murmurs, rubs, gallops, no pedal edema.   Gastrointestinal system: soft, non-tender, non-distended abdomen Central nervous system: alert and oriented x4. no gross focal  neurologic deficits, normal speech Extremities: moves all, no cyanosis, normal tone Psychiatry: normal mood, congruent affect, judgement and insight appear normal    Data Reviewed: I have personally reviewed following labs and imaging studies  CBC: Recent Labs  Lab 11/25/18 1527 11/26/18 1003 11/27/18 1351 11/29/18 0406 11/30/18 0431  WBC 16.4* 25.2* 19.0* 19.1* 19.6*  NEUTROABS 13.0*  --  16.5*  --  15.5*  HGB 14.4 13.1 13.8 14.0 14.5  HCT 44.1 41.2 41.8 43.4 45.1  MCV 85.1 86.9 83.6 82.7 83.2  PLT 416* 433* 454* 577* 123456*   Basic Metabolic Panel: Recent Labs  Lab 11/25/18 1527 11/25/18 2236 11/26/18 1003 11/27/18 1351 11/28/18 1445 11/29/18 0406 11/30/18 0431  NA 132*  --  134* 133*  --  132* 131*  K 3.4*  --  4.0 3.8 3.6 3.9 4.1  CL 95*  --  98 96*  --  94* 94*  CO2 24  --  21* 23  --  28 27  GLUCOSE 94  --  206* 246*  --  222* 222*  BUN 17  --  23 40*  --  54* 68*  CREATININE 1.03  --  0.89 0.99  --  0.86 1.00  CALCIUM 9.9  --  9.3 9.3  --  9.7 9.3  MG  --  1.9  --   --  2.4  --  2.7*   GFR: Estimated Creatinine Clearance: 59.4 mL/min (by C-G formula based on SCr of 1 mg/dL). Liver Function Tests: Recent Labs  Lab 11/26/18 1003  AST 38  ALT 18  ALKPHOS 59  BILITOT 1.4*  PROT 8.2*  ALBUMIN 2.9*   No results for input(s): LIPASE, AMYLASE in the last 168 hours. No results for input(s): AMMONIA in the last 168 hours. Coagulation Profile: Recent Labs  Lab 11/28/18 0339  INR 1.3*   Cardiac Enzymes: No results for input(s): CKTOTAL, CKMB, CKMBINDEX, TROPONINI in the last 168 hours. BNP (last 3 results) No results for input(s): PROBNP in the last 8760 hours. HbA1C: No results for input(s): HGBA1C in the last 72 hours. CBG: Recent Labs  Lab 11/29/18 1213 11/29/18 1652 11/29/18 2108 11/30/18 0813 11/30/18 1153  GLUCAP 293* 254* 246* 242* 355*   Lipid Profile: No results for input(s): CHOL, HDL, LDLCALC, TRIG, CHOLHDL, LDLDIRECT in the last 72  hours. Thyroid Function Tests: No results for input(s): TSH, T4TOTAL, FREET4, T3FREE, THYROIDAB in the last 72 hours. Anemia Panel: No results for input(s): VITAMINB12, FOLATE, FERRITIN, TIBC, IRON, RETICCTPCT in the last 72 hours. Sepsis Labs: Recent Labs  Lab 11/25/18 1527 11/25/18 1735 11/26/18 1638 11/27/18 0409 11/28/18 0339  PROCALCITON  --   --  0.11 0.16 0.11  LATICACIDVEN 2.9* 1.9  --   --   --     Recent Results (from the past 240 hour(s))  SARS Coronavirus 2 by RT PCR (hospital order, performed in [place] hospital lab) Nasopharyngeal Nasopharyngeal Swab     Status: None   Collection Time: 11/25/18  3:27 PM   Specimen: Nasopharyngeal Swab  Result Value Ref Range Status   SARS Coronavirus 2 NEGATIVE NEGATIVE Final    Comment: (NOTE) If result is NEGATIVE SARS-CoV-2 target nucleic acids  are NOT DETECTED. The SARS-CoV-2 RNA is generally detectable in upper and lower  respiratory specimens during the acute phase of infection. The lowest  concentration of SARS-CoV-2 viral copies this assay can detect is 250  copies / mL. A negative result does not preclude SARS-CoV-2 infection  and should not be used as the sole basis for treatment or other  patient management decisions.  A negative result may occur with  improper specimen collection / handling, submission of specimen other  than nasopharyngeal swab, presence of viral mutation(s) within the  areas targeted by this assay, and inadequate number of viral copies  (<250 copies / mL). A negative result must be combined with clinical  observations, patient history, and epidemiological information. If result is POSITIVE SARS-CoV-2 target nucleic acids are DETECTED. The SARS-CoV-2 RNA is generally detectable in upper and lower  respiratory specimens dur ing the acute phase of infection.  Positive  results are indicative of active infection with SARS-CoV-2.  Clinical  correlation with patient history and other diagnostic  information is  necessary to determine patient infection status.  Positive results do  not rule out bacterial infection or co-infection with other viruses. If result is PRESUMPTIVE POSTIVE SARS-CoV-2 nucleic acids MAY BE PRESENT.   A presumptive positive result was obtained on the submitted specimen  and confirmed on repeat testing.  While 2019 novel coronavirus  (SARS-CoV-2) nucleic acids may be present in the submitted sample  additional confirmatory testing may be necessary for epidemiological  and / or clinical management purposes  to differentiate between  SARS-CoV-2 and other Sarbecovirus currently known to infect humans.  If clinically indicated additional testing with an alternate test  methodology [id number]) is advised. The SARS-CoV-2 RNA is generally  detectable in upper and lower respiratory sp ecimens during the acute  phase of infection. The expected result is Negative. Fact Sheet for Patients:  StrictlyIdeas.no Fact Sheet for Healthcare Providers: BankingDealers.co.za This test is not yet approved or cleared by the [place] FDA and has been authorized for detection and/or diagnosis of SARS-CoV-2 by FDA under an Emergency Use Authorization (EUA).  This EUA will remain in effect (meaning this test can be used) for the duration of the COVID-19 declaration under Section 564(b)(1) of the Act, 21 U.S.C. section 360bbb-3(b)(1), unless the authorization is terminated or revoked sooner. Performed at [facility], [address] [place], [place] [address]   Blood culture (routine x 2)     Status: None (Preliminary result)   Collection Time: 11/25/18  3:27 PM   Specimen: BLOOD  Result Value Ref Range Status   Specimen Description BLOOD LEFT ANTECUBITAL  Final   Special Requests   Final    BOTTLES DRAWN AEROBIC AND ANAEROBIC Blood Culture adequate volume   Culture   Final    NO GROWTH 4 DAYS Performed at [facility], [address] [place], [place] [address]    Report Status PENDING  Incomplete  Blood culture (routine x 2)     Status: None (Preliminary result)   Collection Time: 11/25/18  7:40 PM   Specimen: BLOOD  Result Value Ref Range Status   Specimen Description BLOOD RIGHT ANTECUBITAL  Final   Special Requests   Final    BOTTLES DRAWN AEROBIC AND ANAEROBIC Blood Culture results may not be optimal due to an excessive volume of blood received in culture bottles   Culture   Final    NO GROWTH 4 DAYS Performed at [facility], [address] [place], [place] [address]    Report Status PENDING  Incomplete  SARS Coronavirus 2 by RT PCR (hospital order, performed in [facility] hospital lab) Nasopharyngeal Nasopharyngeal Swab     Status: None   Collection Time: 11/26/18 10:03 AM   Specimen: Nasopharyngeal Swab  Result Value Ref Range Status   SARS Coronavirus 2 NEGATIVE NEGATIVE Final    Comment: (NOTE) If result is NEGATIVE SARS-CoV-2 target nucleic acids are NOT DETECTED. The SARS-CoV-2 RNA is generally detectable in upper and lower  respiratory specimens during the acute phase of infection. The lowest  concentration of SARS-CoV-2 viral copies this assay can detect is 250  copies / mL. A negative result does not preclude SARS-CoV-2 infection  and should not be used as the sole basis for treatment or other  patient management decisions.  A negative result may occur with  improper specimen collection / handling, submission of specimen other  than nasopharyngeal swab, presence of viral mutation(s) within the  areas targeted by this assay, and inadequate number of viral copies  (<250 copies / mL). A negative result must be combined with clinical  observations, patient history, and epidemiological information. If result is POSITIVE SARS-CoV-2 target nucleic acids are DETECTED. The SARS-CoV-2 RNA is generally detectable in upper and lower  respiratory specimens dur ing  the acute phase of infection.  Positive  results are indicative of active infection with SARS-CoV-2.  Clinical  correlation with patient history and other diagnostic information is  necessary to determine patient infection status.  Positive results do  not rule out bacterial infection or co-infection with other viruses. If result is PRESUMPTIVE POSTIVE SARS-CoV-2 nucleic acids MAY BE PRESENT.   A presumptive positive result was obtained on the submitted specimen  and confirmed on repeat testing.  While 2019 novel coronavirus  (SARS-CoV-2) nucleic acids may be present in the submitted sample  additional confirmatory testing may be necessary for epidemiological  and / or clinical management purposes  to differentiate between  SARS-CoV-2 and other Sarbecovirus currently known to infect humans.  If clinically indicated additional testing with an alternate test  methodology [phone]) is advised. The SARS-CoV-2 RNA is generally  detectable in upper and lower respiratory sp ecimens during the acute  phase of infection. The expected result is Negative. Fact Sheet for Patients:  StrictlyIdeas.no Fact Sheet for Healthcare Providers: BankingDealers.co.za This test is not yet approved or cleared by the [place] FDA and has been authorized for detection and/or diagnosis of SARS-CoV-2 by FDA under an Emergency Use Authorization (EUA).  This EUA will remain in effect (meaning this test can be used) for the duration of the COVID-19 declaration under Section 564(b)(1) of the Act, 21 U.S.C. section 360bbb-3(b)(1), unless the authorization is terminated or revoked sooner. Performed at [facility], [address] [place], [place] [address]   Respiratory Panel by PCR     Status: None   Collection Time: 11/26/18 11:59 PM   Specimen: Nasopharyngeal Swab; Respiratory  Result Value Ref Range Status   Adenovirus NOT DETECTED NOT DETECTED Final    Coronavirus 229E NOT DETECTED NOT DETECTED Final    Comment: (NOTE) The Coronavirus on the Respiratory Panel, DOES NOT test for the novel  Coronavirus (2019 nCoV)    Coronavirus HKU1 NOT DETECTED NOT DETECTED Final   Coronavirus NL63 NOT DETECTED NOT DETECTED Final   Coronavirus OC43 NOT DETECTED NOT DETECTED Final   Metapneumovirus NOT DETECTED NOT DETECTED Final   Rhinovirus / Enterovirus NOT DETECTED NOT DETECTED  Final   Influenza A NOT DETECTED NOT DETECTED Final   Influenza B NOT DETECTED NOT DETECTED Final   Parainfluenza Virus 1 NOT DETECTED NOT DETECTED Final   Parainfluenza Virus 2 NOT DETECTED NOT DETECTED Final   Parainfluenza Virus 3 NOT DETECTED NOT DETECTED Final   Parainfluenza Virus 4 NOT DETECTED NOT DETECTED Final   Respiratory Syncytial Virus NOT DETECTED NOT DETECTED Final   Bordetella pertussis NOT DETECTED NOT DETECTED Final   Chlamydophila pneumoniae NOT DETECTED NOT DETECTED Final   Mycoplasma pneumoniae NOT DETECTED NOT DETECTED Final    Comment: Performed at [facility], [place] [address], [place], [place] [address]         Radiology Studies: No results found.      Scheduled Meds: . aspirin EC  81 mg Oral Daily  . brimonidine  1 drop Both Eyes BID  . clopidogrel  75 mg Oral Daily  . docusate sodium  100 mg Oral BID  . dorzolamide  1 drop Both Eyes BID  . enoxaparin (LOVENOX) injection  40 mg Subcutaneous Q24H  . furosemide  40 mg Intravenous BID  . hydrochlorothiazide  25 mg Oral Daily  . insulin aspart  0-9 Units Subcutaneous TID WC  . insulin aspart  6 Units Subcutaneous TID WC  . insulin glargine  8 Units Subcutaneous QHS  . ipratropium-albuterol  3 mL Nebulization TID  . latanoprost  1 drop Both Eyes QHS  . levofloxacin  750 mg Oral Daily  . lisinopril  40 mg Oral Daily  . magnesium oxide  400 mg Oral Daily  . methylPREDNISolone (SOLU-MEDROL) injection  60 mg Intravenous Q12H  . multivitamin with minerals  1 tablet Oral Daily  .  niacin  500 mg Oral QHS  . Ensure Max Protein  11 oz Oral BID BM  . timolol  1 drop Both Eyes BID   Continuous Infusions:   LOS: 5 days    Time spent: 30 min    Ezekiel Slocumb, DO [facility] Pager: [phone]  If 7PM-7AM, please contact night-coverage [email or web address] Password [facility] 11/30/2018, 12:18 PM

## 2018-11-30 NOTE — Progress Notes (Signed)
Pulmonary Medicine          Date: 11/30/2018,   MRN# [id number] Gerald Clark [birth year]     AdmissionWeight: 72.6 kg                 CurrentWeight: 69.4 kg(taken with bed scale)      CHIEF COMPLAINT:   Acute hypoxemic respiratory failure   SUBJECTIVE    Patient was able to ambulate with PT, he was weaned down to 4L/min [place] but required 10L/min on exertion.  He was unclear regarding fluid restriction and I have asked him to only drink minimum while we are diuresing.   Overall he appears improved. 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 Anticipate possible d/c on Monday. Blood sugar could be better while on steroids consider diabetic coordinator consult.   PAST MEDICAL HISTORY   Past Medical History:  Diagnosis Date   Arthritis    Asthma    childhood asthma   Cancer ([place])    Basal Cell Skin Cancer   Diabetes mellitus without complication (HCC)    GERD (gastroesophageal reflux disease)    Glaucoma    History of chicken pox    Hyperlipemia    Hypertension    Peripheral vascular disease ([place])      SURGICAL HISTORY   Past Surgical History:  Procedure Laterality Date   COLONOSCOPY N/A 06/02/2014   Procedure: COLONOSCOPY;  Surgeon: Josefine Class, MD;  Location: [facility] ENDOSCOPY;  Service: Endoscopy;  Laterality: N/A;   INGUINAL HERNIA REPAIR Left 06/28/2016   Procedure: HERNIA REPAIR INGUINAL ADULT WITH MESH;  Surgeon: Leonie Green, MD;  Location: [facility] ORS;  Service: General;  Laterality: Left;   LOWER EXTREMITY ANGIOGRAPHY Right 10/24/2016   Procedure: Lower Extremity Angiography;  Surgeon: Algernon Huxley, MD;  Location: [facility];  Service: Cardiovascular;  Laterality: Right;     FAMILY HISTORY   Family History  Problem Relation Age of Onset   Hypertension Mother    Hypertension Father    Cancer Father    Breast cancer Sister    Cancer Sister      SOCIAL HISTORY    Social History   Tobacco Use   Smoking status: Former Smoker    Packs/day: 2.00    Types: Cigarettes    Quit date: 04/25/2011    Years since quitting: 7.6   Smokeless tobacco: Never Used  Substance Use Topics   Alcohol use: Yes    Alcohol/week: 5.0 standard drinks    Types: 5 Cans of beer per week    Comment: occas   Drug use: No     MEDICATIONS    Home Medication:    Current Medication:  Current Facility-Administered Medications:    acetaminophen (TYLENOL) tablet 650 mg, 650 mg, Oral, Q6H PRN, 650 mg at 11/29/18 1845 **OR** acetaminophen (TYLENOL) suppository 650 mg, 650 mg, Rectal, Q6H PRN, Mansy, Jan A, MD   aspirin EC tablet 81 mg, 81 mg, Oral, Daily, Mansy, Jan A, MD, 81 mg at 11/30/18 0837   brimonidine (ALPHAGAN) 0.2 % ophthalmic solution 1 drop, 1 drop, Both Eyes, BID, Mansy, Jan A, MD, 1 drop at 11/30/18 0848   clopidogrel (PLAVIX) tablet 75 mg, 75 mg, Oral, Daily, Mansy, Jan A, MD, 75 mg at 11/30/18 0838   docusate sodium (COLACE) capsule 100 mg, 100 mg, Oral, BID, Nicole Kindred A, DO, 100 mg at 11/30/18 0836   dorzolamide (TRUSOPT) 2 % ophthalmic solution  1 drop, 1 drop, Both Eyes, BID, Mansy, Jan A, MD, 1 drop at 11/30/18 0847   enoxaparin (LOVENOX) injection 40 mg, 40 mg, Subcutaneous, Q24H, Jennye Boroughs, MD, 40 mg at 11/29/18 2139   furosemide (LASIX) injection 40 mg, 40 mg, Intravenous, BID, Ottie Glazier, MD, 40 mg at 11/30/18 [id number]   hydrochlorothiazide (HYDRODIURIL) tablet 25 mg, 25 mg, Oral, Daily, Mansy, Jan A, MD, 25 mg at 11/29/18 1016   insulin aspart (novoLOG) injection 0-9 Units, 0-9 Units, Subcutaneous, TID WC, Mansy, Jan A, MD, 3 Units at 11/30/18 0839   insulin aspart (novoLOG) injection 6 Units, 6 Units, Subcutaneous, TID WC, Nicole Kindred A, DO, 6 Units at 11/30/18 0839   insulin glargine (LANTUS) injection 8 Units, 8 Units, Subcutaneous, QHS, Ezekiel Slocumb, DO, 8 Units at 11/29/18 2139   ipratropium-albuterol (DUONEB)  0.5-2.5 (3) MG/3ML nebulizer solution 3 mL, 3 mL, Nebulization, TID, Nicole Kindred A, DO, 3 mL at 11/30/18 0800   latanoprost (XALATAN) 0.005 % ophthalmic solution 1 drop, 1 drop, Both Eyes, QHS, Mansy, Jan A, MD, 1 drop at 11/29/18 2140   levofloxacin (LEVAQUIN) tablet 750 mg, 750 mg, Oral, Daily, Lanney Gins, Latavius Capizzi, MD, 750 mg at 11/29/18 1724   lisinopril (ZESTRIL) tablet 40 mg, 40 mg, Oral, Daily, Mansy, Jan A, MD, 40 mg at 11/29/18 1016   magnesium hydroxide (MILK OF MAGNESIA) suspension 30 mL, 30 mL, Oral, Daily PRN, Mansy, Jan A, MD   magnesium oxide (MAG-OX) tablet 400 mg, 400 mg, Oral, Daily, Mansy, Jan A, MD, 400 mg at 11/30/18 [id number]   methylPREDNISolone sodium succinate (SOLU-MEDROL) 125 mg/2 mL injection 60 mg, 60 mg, Intravenous, Q12H, Ottie Glazier, MD, 60 mg at 11/30/18 0452   multivitamin with minerals tablet 1 tablet, 1 tablet, Oral, Daily, Jennye Boroughs, MD, 1 tablet at 11/30/18 0837   niacin (SLO-NIACIN) CR tablet 500 mg, 500 mg, Oral, QHS, Mansy, Jan A, MD, 500 mg at 11/29/18 2138   ondansetron (ZOFRAN) tablet 4 mg, 4 mg, Oral, Q6H PRN **OR** ondansetron (ZOFRAN) injection 4 mg, 4 mg, Intravenous, Q6H PRN, Mansy, Jan A, MD   polyvinyl alcohol (LIQUIFILM TEARS) 1.4 % ophthalmic solution 1 drop, 1 drop, Both Eyes, TID PRN, Mansy, Jan A, MD   protein supplement (ENSURE MAX) liquid, 11 oz, Oral, BID BM, Jennye Boroughs, MD, 11 oz at 11/29/18 1732   timolol (TIMOPTIC) 0.5 % ophthalmic solution 1 drop, 1 drop, Both Eyes, BID, Mansy, Jan A, MD, 1 drop at 11/30/18 0848   traZODone (DESYREL) tablet 25 mg, 25 mg, Oral, QHS PRN, Mansy, Arvella Merles, MD    ALLERGIES   Patient has no known allergies.     REVIEW OF SYSTEMS    Review of Systems:  Gen:  Denies  fever, sweats, chills weigh loss  HEENT: Denies blurred vision, double vision, ear pain, eye pain, hearing loss, nose bleeds, sore throat Cardiac:  No dizziness, chest pain or heaviness, chest tightness,edema Resp:    Denies cough or sputum porduction, shortness of breath,wheezing, hemoptysis,  Gi: Denies swallowing difficulty, stomach pain, nausea or vomiting, diarrhea, constipation, bowel incontinence Gu:  Denies bladder incontinence, burning urine Ext:   Denies Joint pain, stiffness or swelling Skin: Denies  skin rash, easy bruising or bleeding or hives Endoc:  Denies polyuria, polydipsia , polyphagia or weight change Psych:   Denies depression, insomnia or hallucinations   Other:  All other systems negative   VS: BP 106/60 (BP Location: Left Arm)    Pulse 98    Temp Marland Kitchen)  97.5 F (36.4 C) (Axillary)    Resp 16    Ht 5\' 6"  (1.676 m)    Wt 69.4 kg Comment: taken with bed scale   SpO2 (!) 89%    BMI 24.69 kg/m      PHYSICAL EXAM    GENERAL:NAD, no fevers, chills, no weakness no fatigue HEAD: Normocephalic, atraumatic.  EYES: Pupils equal, round, reactive to light. Extraocular muscles intact. No scleral icterus.  MOUTH: Moist mucosal membrane. Dentition intact. No abscess noted.  EAR, NOSE, THROAT: Clear without exudates. No external lesions.  NECK: Supple. No thyromegaly. No nodules. No JVD.  PULMONARY: bilateral rhonchi worse at bases improved from previous.  CARDIOVASCULAR: S1 and S2. Regular rate and rhythm. No murmurs, rubs, or gallops. No edema. Pedal pulses 2+ bilaterally.  GASTROINTESTINAL: Soft, nontender, nondistended. No masses. Positive bowel sounds. No hepatosplenomegaly.  MUSCULOSKELETAL: No swelling, clubbing, or edema. Range of motion full in all extremities.  NEUROLOGIC: Cranial nerves II through XII are intact. No gross focal neurological deficits. Sensation intact. Reflexes intact.  SKIN: No ulceration, lesions, rashes, or cyanosis. Skin warm and dry. Turgor intact.  PSYCHIATRIC: Mood, affect within normal limits. The patient is awake, alert and oriented x 3. Insight, judgment intact.       IMAGING    Ct Angio Chest Pe W And/or Wo Contrast  Result Date:  11/25/2018 CLINICAL DATA:  Shortness of breath beginning 4 weeks ago, gradually worsening. EXAM: CT ANGIOGRAPHY CHEST WITH CONTRAST TECHNIQUE: Multidetector CT imaging of the chest was performed using the standard protocol during bolus administration of intravenous contrast. Multiplanar CT image reconstructions and MIPs were obtained to evaluate the vascular anatomy. CONTRAST:  41mL OMNIPAQUE IOHEXOL 350 MG/ML SOLN COMPARISON:  Current chest radiograph. FINDINGS: Cardiovascular: There is satisfactory opacification of the pulmonary arteries to the segmental level. There is no evidence of a pulmonary embolism. Heart is normal in size. Three-vessel coronary artery calcifications. No pericardial effusion. Great vessels normal in caliber. No aortic dissection. Aortic atherosclerosis. Mediastinum/Nodes: 9 mm hypoattenuating inferior right thyroid lobe nodule. No neck base or axillary masses or enlarged lymph nodes. Mediastinal adenopathy. Several reference measurements are made. 17 mm short axis prevascular node. Azygos level right paratracheal node measuring 17 mm in short axis. More superior right paratracheal node measuring 14 mm in short axis subcarinal node measuring 2.1 cm in short axis. Mildly enlarged hilar nodes, 1.6 cm short axis superior right hilar node and a 1.5 cm mid left hilar and 1.8 cm left infrahilar nodes. Trachea and esophagus are unremarkable.  Small hiatal hernia. Lungs/Pleura: Lungs demonstrate coarse heterogeneous interstitial thickening, areas of interspersed cystic change, architectural distortion and intervening areas of ground-glass opacity, the latter most prominent in the lower lobes. Cystic change appears to be underlying paraseptal and mild centrilobular emphysema. Minimal bronchiectasis. No lung mass or suspicious nodule. No pleural effusion or pneumothorax. Upper Abdomen: No acute findings. Prominent gastrohepatic ligament lymph nodes, largest 1 point 2 cm in short axis. Musculoskeletal:  No fracture or acute finding. No osteoblastic or osteolytic lesions. Review of the MIP images confirms the above findings. IMPRESSION: 1. No evidence of a pulmonary embolism. 2. Extensive findings of interstitial lung disease include a prominent component of ground-glass opacity, the latter most evident in the lower lobes. There is associated prominent mediastinal and less prominent hilar adenopathy. Mild underlying emphysema. 3. Chronic findings include three-vessel coronary artery calcifications and aortic atherosclerosis. Aortic Atherosclerosis (ICD10-I70.0) and Emphysema (ICD10-J43.9). Electronically Signed   By: Lajean Manes M.D.   On:  11/25/2018 18:24   Dg Chest Port 1 View  Result Date: 11/27/2018 CLINICAL DATA:  Acute respiratory failure EXAM: PORTABLE CHEST 1 VIEW COMPARISON:  11/25/2018, CT 11/25/2018 FINDINGS: Bilateral reticular and ground-glass opacity, consistent with chronic interstitial lung disease and fibrosis. Emphysematous disease. Ground-glass opacity at the bases without significant change. Stable cardiomediastinal silhouette with aortic atherosclerosis. No pneumothorax IMPRESSION: No significant interval change since 11/25/2018. Extensive reticular and ground-glass opacity corresponding to interstitial lung disease and fibrosis on prior CT. There may be acute superimposed infection or inflammatory change at the lung bases. Similar appearance of cardiomegaly Electronically Signed   By: Donavan Foil M.D.   On: 11/27/2018 17:26   Dg Chest Port 1 View  Result Date: 11/25/2018 CLINICAL DATA:  Shortness of breath starting 4 weeks ago, gradually worsening. Former smoker. EXAM: PORTABLE CHEST 1 VIEW COMPARISON:  None. FINDINGS: Borderline cardiomegaly. Patchy bilateral airspace opacities, predominantly peripheral. No pleural effusion or pneumothorax seen. Osseous structures about the chest are unremarkable. IMPRESSION: 1. Borderline cardiomegaly. 2. Bilateral airspace opacities, of uncertain  chronicity. Findings could represent chronic interstitial lung disease/fibrosis, acute interstitial edema or multifocal pneumonia. In the absence of fever, suspect some degree of acute edema superimposed on chronic interstitial lung disease/fibrosis. Electronically Signed   By: Franki Cabot M.D.   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   -viral panel stat - negative   - lasix  40 bid- patient is net 7.2 cc negative  - Solumedrol 80mg  BID decreased to 60 bid today  - patient is DNR/DNI ,-Pulmicort neublizer therapy   - DuoNEB - caution as patient is  tachycardic   - empiric antibiotics  - decrease any unnecessary infusions - will switch from IV rocephin and zithromax to PO Levoquin 0000000 daily  - Procalictonin -mildly elevated - blood cultures X2 -negative to date -4d -Arterial blood gas-severe hypoxemia -continue [place] with weaning parameters to goal SpO2>88 -repeat CXR in am  -updated PMD Dr Caryl Comes on hospital course.     Thank you for allowing me to participate in the care of this patient.    Patient/Family are satisfied with care plan and all questions have been answered.  This document was prepared using Dragon voice recognition software and may include unintentional dictation errors.     Ottie Glazier, M.D.  Div[facility] of [facility]

## 2018-11-30 NOTE — Progress Notes (Signed)
OT Cancellation Note  Patient Details Name: Gerald Clark MRN: [id number] DOB: [birth year]   Cancelled Treatment:    Reason Eval/Treat Not Completed: Medical issues which prohibited therapy. Thank you for the OT consult. Order received and chart reviewed. Upon arrival to pt room, pt endorsing nausea and emesis. Pt states he has informed his RN, who is on their way. Declines additional assistance from this OT at this time. States "I just need to get through it". OT will re-attempt at a later time/date as available and pt medically appropriate for OT evaluation.   Shara Blazing, M.S., OTR/L Ascom: [phone] 11/30/18, 9:03 AM

## 2018-11-30 NOTE — Care Management Important Message (Signed)
Important Message  Patient Details  Name: Gerald Clark MRN: [id number] Date of Birth: 10-27-1945   Medicare Important Message Given:        Juliann Pulse A Navi Ewton 11/30/2018, 10:34 AM

## 2018-12-01 ENCOUNTER — Inpatient Hospital Stay: Payer: Medicare HMO

## 2018-12-01 LAB — CBC WITH DIFFERENTIAL/PLATELET
Abs Immature Granulocytes: 0.24 10*3/uL — ABNORMAL HIGH (ref 0.00–0.07)
Basophils Absolute: 0.1 10*3/uL (ref 0.0–0.1)
Basophils Relative: 0 %
Eosinophils Absolute: 0 10*3/uL (ref 0.0–0.5)
Eosinophils Relative: 0 %
HCT: 46.1 % (ref 39.0–52.0)
Hemoglobin: 15.2 g/dL (ref 13.0–17.0)
Immature Granulocytes: 1 %
Lymphocytes Relative: 10 %
Lymphs Abs: 2.1 10*3/uL (ref 0.7–4.0)
MCH: 27.2 pg (ref 26.0–34.0)
MCHC: 33 g/dL (ref 30.0–36.0)
MCV: 82.6 fL (ref 80.0–100.0)
Monocytes Absolute: 1.5 10*3/uL — ABNORMAL HIGH (ref 0.1–1.0)
Monocytes Relative: 7 %
Neutro Abs: 16.6 10*3/uL — ABNORMAL HIGH (ref 1.7–7.7)
Neutrophils Relative %: 82 %
Platelets: 537 10*3/uL — ABNORMAL HIGH (ref 150–400)
RBC: 5.58 MIL/uL (ref 4.22–5.81)
RDW: 14 % (ref 11.5–15.5)
WBC: 20.4 10*3/uL — ABNORMAL HIGH (ref 4.0–10.5)
nRBC: 0 % (ref 0.0–0.2)

## 2018-12-01 LAB — BASIC METABOLIC PANEL
Anion gap: 13 (ref 5–15)
BUN: 67 mg/dL — ABNORMAL HIGH (ref 8–23)
CO2: 26 mmol/L (ref 22–32)
Calcium: 9.6 mg/dL (ref 8.9–10.3)
Chloride: 90 mmol/L — ABNORMAL LOW (ref 98–111)
Creatinine, Ser: 0.99 mg/dL (ref 0.61–1.24)
GFR calc Af Amer: >60 mL/min (ref >60)
GFR calc non Af Amer: >60 mL/min (ref >60)
Glucose, Bld: 232 mg/dL — ABNORMAL HIGH (ref 70–99)
Potassium: 3.9 mmol/L (ref 3.5–5.1)
Sodium: 129 mmol/L — ABNORMAL LOW (ref 135–145)

## 2018-12-01 LAB — OSMOLALITY: Osmolality: 310 mOsm/kg — ABNORMAL HIGH (ref 275–295)

## 2018-12-01 LAB — OSMOLALITY, URINE: Osmolality, Ur: 564 mOsm/kg (ref 300–900)

## 2018-12-01 LAB — GLUCOSE, CAPILLARY
Glucose-Capillary: 393 mg/dL — ABNORMAL HIGH (ref 70–99)
Glucose-Capillary: 181 mg/dL — ABNORMAL HIGH (ref 70–99)

## 2018-12-01 LAB — SODIUM, URINE, RANDOM: Sodium, Ur: 11 mmol/L

## 2018-12-01 MED ORDER — SODIUM CHLORIDE 0.9% FLUSH
3.0000 mL | Freq: Two times a day (BID) | INTRAVENOUS | Status: DC
  Administered 2018-12-01 – 2018-12-04 (×7): 3 mL via INTRAVENOUS

## 2018-12-01 MED ORDER — INSULIN ASPART 100 UNIT/ML ~~LOC~~ SOLN
12.0000 [IU] | Freq: Three times a day (TID) | SUBCUTANEOUS | Status: DC
  Administered 2018-12-01: 12 [IU] via SUBCUTANEOUS
  Filled 2018-12-01: qty 1

## 2018-12-01 MED ORDER — SODIUM CHLORIDE 0.9% FLUSH
3.0000 mL | INTRAVENOUS | Status: DC | PRN
  Administered 2018-12-01: 3 mL via INTRAVENOUS
  Filled 2018-12-01: qty 3

## 2018-12-01 MED ORDER — SENNA 8.6 MG PO TABS
1.0000 | ORAL_TABLET | Freq: Every day | ORAL | Status: DC | PRN
  Administered 2018-12-01 – 2018-12-02 (×2): 8.6 mg via ORAL
  Filled 2018-12-01 (×2): qty 1

## 2018-12-01 MED ORDER — INSULIN GLARGINE 100 UNIT/ML ~~LOC~~ SOLN
12.0000 [IU] | Freq: Every day | SUBCUTANEOUS | Status: DC
  Administered 2018-12-01: 12 [IU] via SUBCUTANEOUS
  Filled 2018-12-01 (×2): qty 0.12

## 2018-12-01 MED ORDER — METHYLPREDNISOLONE SODIUM SUCC 40 MG IJ SOLR
40.0000 mg | Freq: Two times a day (BID) | INTRAMUSCULAR | Status: DC
  Administered 2018-12-01 – 2018-12-02 (×2): 40 mg via INTRAVENOUS
  Filled 2018-12-01 (×2): qty 1

## 2018-12-01 NOTE — Progress Notes (Addendum)
PROGRESS NOTE    Gerald Clark  [id number] DOB: 31-Jul-1945 DOA: 11/25/2018  PCP: Adin Hector, MD    LOS - 6   Brief Narrative:  73 y.o.Caucasian malewith history of diabetes mellitus, hypertension, dyslipidemia peripheral vascular disease, who presented to the emergency room with acuteworseningof dyspneathat had been ongoing forthe last 4 weeks.No associated cough or wheezing, N/V/D, loss of taste or smell. Subjective chills but afebrile. No sick contacts or COVID exposure. In the ED, patient was tachypneic and hypoxic with spO2 72% on room air which improved with NRB mask. CTA chest ruled out PE, but did show extensive interstitial lung disease including a prominent ground glass opacity in lower lobes, with mediastinal and hilar lymphadenopathy, and mild underlying emphysema. COVID-19 tested and negative. He was treated with IV Solu-medrol and IV fluids before admission. Pulmonology Consulted. Has continued on IV steroids, diuresis and supplemental oxygen, currently weaning down.   Subjective 11/7: Patient seen, sleeping comfortably but awoke easily.  No acute events overnight. Patient reports feeling well, breathing better.  Assessment & Plan:   Principal Problem:   Acute hypoxemic respiratory failure (HCC) Active Problems:   Hypertension   Diabetes mellitus without complication (HCC)   PAD (peripheral artery disease) (HCC)   CAP (community acquired pneumonia)   Community acquired pneumonia   Hypoxia   Interstitial lung disease ([place])   Bandemia   Mediastinal adenopathy   Malnutrition of moderate degree   Acute hypoxemic respiratory failure:apparently secondary to exacerbation of underlying, previously undiagnosed(?) interstitial lung disease.  Interstitial infiltrates? Community-acquired pneumonia:vs above (interstitial lung disease/pulmonary fibrosis with exacerbation).Coronavirus test is negative x2. - Pulmonology following, appreciate  recs:  - continue supplemental O2 for spO2 > 88%  - decreased Solu-medrol to 40 BID today, transition to PO prednisone tomorrow  - stopped POLevaquin, course completed for CA-PNA  - stopped lasix today  Hyponatremia - suspect hypovolemic in setting of diuresis.  Na 129 today, had slowly declined over past few days. - stopping diuresis - will check serum and urine osm, urine sodium to confirm  Non-sustained V-tach(on telemetry 11/4): patient asymptomatic at the time.  - maintain K>4.0, Mg>2.0 - keep on telemetry  Mediastinal and hilar lymphadenopathy: Follow-up with pulmonologist for further recommendations.  Hypokalemia:resolved.Repeat BMP. Maintain K>4.0.  Leukocytosis:secondary to steroids. Monitor withCBC's.  Peripheral vascular disease: - continue homeaspirin and Plavix   Hypertension:stable.  - continueHCTZ and lisinopril  Type II DM:  Hyperglycemia secondary to steroids Metformin, Actos and glipizide have been held for now - increased short acting with meals again (to 12 units) - increased Lantus (to 12 units) - continue sliding scale insulin    DVT prophylaxis: Lovenox   Code Status: DNR  Family Communication: none at bedside Disposition Plan:  Pending repeat PT eval and determine home O2 needs.  Will be transitioned to oral steroid tomorrow, and expect he'll be stable for discharge at that point.   Consultants:   Pulmonology  Procedures:   none  Antimicrobials: (specify start and planned stop date.)  Levaquin PO - completed course 11/2-11/7  Rocephin & Azithromycin 11/1-11/2   Objective: Vitals:   11/30/18 2015 11/30/18 2106 12/01/18 0858 12/01/18 1135  BP:  (!) 93/56 96/72 93/70   Pulse:  97 (!) 110 (!) 110  Resp:  19 20 20   Temp:  97.9 F (36.6 C) (!) 97 F (36.1 C)   TempSrc:   Oral   SpO2: 94% 97% 92% 96%  Weight:      Height:  Intake/Output Summary (Last 24 hours) at 12/01/2018 1342 Last data filed at  12/01/2018 1100 Gross per 24 hour  Intake 360 ml  Output 1250 ml  Net -890 ml   Filed Weights   11/25/18 1517 11/27/18 1528  Weight: 72.6 kg 69.4 kg    Examination:  General exam: sleeping comfortably, no acute distress Respiratory system: decreased breath sounds at bases otherwise clear, no wheezes, rales or rhonchi, normal respiratory effort.  [place] oxygen. Cardiovascular system: normal S1/S2, RRR, no JVD, murmurs, rubs, gallops, no pedal edema.   Gastrointestinal system: soft, non-tender, non-distended abdomen, normal bowel sounds. Central nervous system: alert and oriented x4. no gross focal neurologic deficits, normal speech Extremities: moves all, no edema, normal tone Skin: dry, intact, normal temperature Psychiatry: normal mood, congruent affect, judgement and insight appear normal    Data Reviewed: I have personally reviewed following labs and imaging studies  CBC: Recent Labs  Lab 11/25/18 1527 11/26/18 1003 11/27/18 1351 11/29/18 0406 11/30/18 0431 12/01/18 0408  WBC 16.4* 25.2* 19.0* 19.1* 19.6* 20.4*  NEUTROABS 13.0*  --  16.5*  --  15.5* 16.6*  HGB 14.4 13.1 13.8 14.0 14.5 15.2  HCT 44.1 41.2 41.8 43.4 45.1 46.1  MCV 85.1 86.9 83.6 82.7 83.2 82.6  PLT 416* 433* 454* 577* 537* [id number]*   Basic Metabolic Panel: Recent Labs  Lab 11/25/18 2236 11/26/18 1003 11/27/18 1351 11/28/18 1445 11/29/18 0406 11/30/18 0431 12/01/18 0408  NA  --  134* 133*  --  132* 131* 129*  K  --  4.0 3.8 3.6 3.9 4.1 3.9  CL  --  98 96*  --  94* 94* 90*  CO2  --  21* 23  --  28 27 26   GLUCOSE  --  206* 246*  --  222* 222* 232*  BUN  --  23 40*  --  54* 68* 67*  CREATININE  --  0.89 0.99  --  0.86 1.00 0.99  CALCIUM  --  9.3 9.3  --  9.7 9.3 9.6  MG 1.9  --   --  2.4  --  2.7*  --    GFR: Estimated Creatinine Clearance: 60 mL/min (by C-G formula based on SCr of 0.99 mg/dL). Liver Function Tests: Recent Labs  Lab 11/26/18 1003  AST 38  ALT 18  ALKPHOS 59  BILITOT 1.4*    PROT 8.2*  ALBUMIN 2.9*   No results for input(s): LIPASE, AMYLASE in the last 168 hours. No results for input(s): AMMONIA in the last 168 hours. Coagulation Profile: Recent Labs  Lab 11/28/18 0339  INR 1.3*   Cardiac Enzymes: No results for input(s): CKTOTAL, CKMB, CKMBINDEX, TROPONINI in the last 168 hours. BNP (last 3 results) No results for input(s): PROBNP in the last 8760 hours. HbA1C: No results for input(s): HGBA1C in the last 72 hours. CBG: Recent Labs  Lab 11/30/18 0813 11/30/18 1153 11/30/18 1618 11/30/18 2105 12/01/18 1144  GLUCAP 242* 355* 209* 179* 393*   Lipid Profile: No results for input(s): CHOL, HDL, LDLCALC, TRIG, CHOLHDL, LDLDIRECT in the last 72 hours. Thyroid Function Tests: No results for input(s): TSH, T4TOTAL, FREET4, T3FREE, THYROIDAB in the last 72 hours. Anemia Panel: No results for input(s): VITAMINB12, FOLATE, FERRITIN, TIBC, IRON, RETICCTPCT in the last 72 hours. Sepsis Labs: Recent Labs  Lab 11/25/18 1527 11/25/18 1735 11/26/18 1638 11/27/18 0409 11/28/18 0339  PROCALCITON  --   --  0.11 0.16 0.11  LATICACIDVEN 2.9* 1.9  --   --   --  Recent Results (from the past 240 hour(s))  SARS Coronavirus 2 by RT PCR (hospital order, performed in [facility] hospital lab) Nasopharyngeal Nasopharyngeal Swab     Status: None   Collection Time: 11/25/18  3:27 PM   Specimen: Nasopharyngeal Swab  Result Value Ref Range Status   SARS Coronavirus 2 NEGATIVE NEGATIVE Final    Comment: (NOTE) If result is NEGATIVE SARS-CoV-2 target nucleic acids are NOT DETECTED. The SARS-CoV-2 RNA is generally detectable in upper and lower  respiratory specimens during the acute phase of infection. The lowest  concentration of SARS-CoV-2 viral copies this assay can detect is 250  copies / mL. A negative result does not preclude SARS-CoV-2 infection  and should not be used as the sole basis for treatment or other  patient management decisions.  A negative  result may occur with  improper specimen collection / handling, submission of specimen other  than nasopharyngeal swab, presence of viral mutation(s) within the  areas targeted by this assay, and inadequate number of viral copies  (<250 copies / mL). A negative result must be combined with clinical  observations, patient history, and epidemiological information. If result is POSITIVE SARS-CoV-2 target nucleic acids are DETECTED. The SARS-CoV-2 RNA is generally detectable in upper and lower  respiratory specimens dur ing the acute phase of infection.  Positive  results are indicative of active infection with SARS-CoV-2.  Clinical  correlation with patient history and other diagnostic information is  necessary to determine patient infection status.  Positive results do  not rule out bacterial infection or co-infection with other viruses. If result is PRESUMPTIVE POSTIVE SARS-CoV-2 nucleic acids MAY BE PRESENT.   A presumptive positive result was obtained on the submitted specimen  and confirmed on repeat testing.  While 2019 novel coronavirus  (SARS-CoV-2) nucleic acids may be present in the submitted sample  additional confirmatory testing may be necessary for epidemiological  and / or clinical management purposes  to differentiate between  SARS-CoV-2 and other Sarbecovirus currently known to infect humans.  If clinically indicated additional testing with an alternate test  methodology [phone]) is advised. The SARS-CoV-2 RNA is generally  detectable in upper and lower respiratory sp ecimens during the acute  phase of infection. The expected result is Negative. Fact Sheet for Patients:  StrictlyIdeas.no Fact Sheet for Healthcare Providers: BankingDealers.co.za This test is not yet approved or cleared by the [place] FDA and has been authorized for detection and/or diagnosis of SARS-CoV-2 by FDA under an Emergency Use Authorization  (EUA).  This EUA will remain in effect (meaning this test can be used) for the duration of the COVID-19 declaration under Section 564(b)(1) of the Act, 21 U.S.C. section 360bbb-3(b)(1), unless the authorization is terminated or revoked sooner. Performed at [facility], [address] [place], [place] [address]   Blood culture (routine x 2)     Status: None (Preliminary result)   Collection Time: 11/25/18  3:27 PM   Specimen: BLOOD  Result Value Ref Range Status   Specimen Description BLOOD LEFT ANTECUBITAL  Final   Special Requests   Final    BOTTLES DRAWN AEROBIC AND ANAEROBIC Blood Culture adequate volume   Culture   Final    NO GROWTH 4 DAYS Performed at [facility], [address] [place],  [address]    Report Status PENDING  Incomplete  Blood culture (routine x 2)     Status: None (Preliminary result)   Collection Time: 11/25/18  7:40 PM  Specimen: BLOOD  Result Value Ref Range Status   Specimen Description BLOOD RIGHT ANTECUBITAL  Final   Special Requests   Final    BOTTLES DRAWN AEROBIC AND ANAEROBIC Blood Culture results may not be optimal due to an excessive volume of blood received in culture bottles   Culture   Final    NO GROWTH 4 DAYS Performed at [facility], [address], [place], [place] [address]    Report Status PENDING  Incomplete  SARS Coronavirus 2 by RT PCR (hospital order, performed in [place] hospital lab) Nasopharyngeal Nasopharyngeal Swab     Status: None   Collection Time: 11/26/18 10:03 AM   Specimen: Nasopharyngeal Swab  Result Value Ref Range Status   SARS Coronavirus 2 NEGATIVE NEGATIVE Final    Comment: (NOTE) If result is NEGATIVE SARS-CoV-2 target nucleic acids are NOT DETECTED. The SARS-CoV-2 RNA is generally detectable in upper and lower  respiratory specimens during the acute phase of infection. The lowest  concentration of SARS-CoV-2 viral copies this assay can detect is 250  copies / mL.  A negative result does not preclude SARS-CoV-2 infection  and should not be used as the sole basis for treatment or other  patient management decisions.  A negative result may occur with  improper specimen collection / handling, submission of specimen other  than nasopharyngeal swab, presence of viral mutation(s) within the  areas targeted by this assay, and inadequate number of viral copies  (<250 copies / mL). A negative result must be combined with clinical  observations, patient history, and epidemiological information. If result is POSITIVE SARS-CoV-2 target nucleic acids are DETECTED. The SARS-CoV-2 RNA is generally detectable in upper and lower  respiratory specimens dur ing the acute phase of infection.  Positive  results are indicative of active infection with SARS-CoV-2.  Clinical  correlation with patient history and other diagnostic information is  necessary to determine patient infection status.  Positive results do  not rule out bacterial infection or co-infection with other viruses. If result is PRESUMPTIVE POSTIVE SARS-CoV-2 nucleic acids MAY BE PRESENT.   A presumptive positive result was obtained on the submitted specimen  and confirmed on repeat testing.  While 2019 novel coronavirus  (SARS-CoV-2) nucleic acids may be present in the submitted sample  additional confirmatory testing may be necessary for epidemiological  and / or clinical management purposes  to differentiate between  SARS-CoV-2 and other Sarbecovirus currently known to infect humans.  If clinically indicated additional testing with an alternate test  methodology [phone]) is advised. The SARS-CoV-2 RNA is generally  detectable in upper and lower respiratory sp ecimens during the acute  phase of infection. The expected result is Negative. Fact Sheet for Patients:  StrictlyIdeas.no Fact Sheet for Healthcare Providers: BankingDealers.co.za This test is not  yet approved or cleared by the [place] FDA and has been authorized for detection and/or diagnosis of SARS-CoV-2 by FDA under an Emergency Use Authorization (EUA).  This EUA will remain in effect (meaning this test can be used) for the duration of the COVID-19 declaration under Section 564(b)(1) of the Act, 21 U.S.C. section 360bbb-3(b)(1), unless the authorization is terminated or revoked sooner. Performed at [facility], [address] [place], [place] [address]   Respiratory Panel by PCR     Status: None   Collection Time: 11/26/18 11:59 PM   Specimen: Nasopharyngeal Swab; Respiratory  Result Value Ref Range Status   Adenovirus NOT DETECTED NOT DETECTED Final   Coronavirus 229E  NOT DETECTED NOT DETECTED Final    Comment: (NOTE) The Coronavirus on the Respiratory Panel, DOES NOT test for the novel  Coronavirus (2019 nCoV)    Coronavirus HKU1 NOT DETECTED NOT DETECTED Final   Coronavirus NL63 NOT DETECTED NOT DETECTED Final   Coronavirus OC43 NOT DETECTED NOT DETECTED Final   Metapneumovirus NOT DETECTED NOT DETECTED Final   Rhinovirus / Enterovirus NOT DETECTED NOT DETECTED Final   Influenza A NOT DETECTED NOT DETECTED Final   Influenza B NOT DETECTED NOT DETECTED Final   Parainfluenza Virus 1 NOT DETECTED NOT DETECTED Final   Parainfluenza Virus 2 NOT DETECTED NOT DETECTED Final   Parainfluenza Virus 3 NOT DETECTED NOT DETECTED Final   Parainfluenza Virus 4 NOT DETECTED NOT DETECTED Final   Respiratory Syncytial Virus NOT DETECTED NOT DETECTED Final   Bordetella pertussis NOT DETECTED NOT DETECTED Final   Chlamydophila pneumoniae NOT DETECTED NOT DETECTED Final   Mycoplasma pneumoniae NOT DETECTED NOT DETECTED Final    Comment: Performed at [facility], [place] [address] [place], [place] [address]         Radiology Studies: Dg Chest [facility] 1 View  Result Date: 12/01/2018 CLINICAL DATA:  Former smoker, admission 11-27-18 due to acute exacerbation of  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 Aeration of the bilateral lungs appears mildly improved. There are coarse bilateral interstitial opacities likely representing underlying pulmonary fibrosis. No pneumothorax or large pleural effusion. IMPRESSION: 1. Mildly improved aeration of the bilateral lungs which may represent resolving acute infection or inflammation. 2. Bilateral interstitial opacities most likely representing underlying pulmonary fibrosis. Electronically Signed   By: Audie Pinto M.D.   On: 12/01/2018 10:46        Scheduled Meds:  aspirin EC  81 mg Oral Daily   brimonidine  1 drop Both Eyes BID   clopidogrel  75 mg Oral Daily   docusate sodium  100 mg Oral BID   dorzolamide  1 drop Both Eyes BID   enoxaparin (LOVENOX) injection  40 mg Subcutaneous Q24H   hydrochlorothiazide  25 mg Oral Daily   insulin aspart  0-9 Units Subcutaneous TID WC   insulin aspart  9 Units Subcutaneous TID WC   insulin glargine  8 Units Subcutaneous QHS   ipratropium-albuterol  3 mL Nebulization TID   latanoprost  1 drop Both Eyes QHS   lisinopril  40 mg Oral Daily   magnesium oxide  400 mg Oral Daily   methylPREDNISolone (SOLU-MEDROL) injection  40 mg Intravenous Q12H   multivitamin with minerals  1 tablet Oral Daily   niacin  500 mg Oral QHS   Ensure Max Protein  11 oz Oral BID BM   sodium chloride flush  3 mL Intravenous Q12H   timolol  1 drop Both Eyes BID   Continuous Infusions:   LOS: 6 days    Time spent: 25-30 min    Ezekiel Slocumb, DO [facility] Pager: [phone]  If 7PM-7AM, please contact night-coverage [email or web address] Password [email or web address] 12/01/2018, 1:42 PM

## 2018-12-01 NOTE — Progress Notes (Signed)
Pulse ox stable at 92 % on High Flo 02 at 6l/[place]. Up in chair x 2 and tolerated well. MD made aware of POCT glucose running in 300's with long acting insulin and solumedrol dose being adjusted. Family in to visit. Limited activity related DOE. Nonproductive dry cough in intervals.

## 2018-12-01 NOTE — Progress Notes (Signed)
Pulmonary Medicine          Date: 12/01/2018,   MRN# [id number] Gerald Clark [birth year]     AdmissionWeight: 72.6 kg                 CurrentWeight: 69.4 kg(taken with bed scale)      CHIEF COMPLAINT:   Acute hypoxemic respiratory failure   SUBJECTIVE    Patient sitting in bed with wife at bedside. We discussed care plan.  He is feeling better.  Plan to wean O2 and optimize for d/c with close follow up on OP in pulmonary clinic.   PAST MEDICAL HISTORY   Past Medical History:  Diagnosis Date   Arthritis    Asthma    childhood asthma   Cancer ([place])    Basal Cell Skin Cancer   Diabetes mellitus without complication (HCC)    GERD (gastroesophageal reflux disease)    Glaucoma    History of chicken pox    Hyperlipemia    Hypertension    Peripheral vascular disease ([place])      SURGICAL HISTORY   Past Surgical History:  Procedure Laterality Date   COLONOSCOPY N/A 06/02/2014   Procedure: COLONOSCOPY;  Surgeon: Josefine Class, MD;  Location: [facility] ENDOSCOPY;  Service: Endoscopy;  Laterality: N/A;   INGUINAL HERNIA REPAIR Left 06/28/2016   Procedure: HERNIA REPAIR INGUINAL ADULT WITH MESH;  Surgeon: Leonie Green, MD;  Location: [facility] ORS;  Service: General;  Laterality: Left;   LOWER EXTREMITY ANGIOGRAPHY Right 10/24/2016   Procedure: Lower Extremity Angiography;  Surgeon: Algernon Huxley, MD;  Location: [facility];  Service: Cardiovascular;  Laterality: Right;     FAMILY HISTORY   Family History  Problem Relation Age of Onset   Hypertension Mother    Hypertension Father    Cancer Father    Breast cancer Sister    Cancer Sister      SOCIAL HISTORY   Social History   Tobacco Use   Smoking status: Former Smoker    Packs/day: 2.00    Types: Cigarettes    Quit date: 04/25/2011    Years since quitting: 7.6   Smokeless tobacco: Never Used  Substance Use Topics   Alcohol use: Yes    Alcohol/week: 5.0 standard  drinks    Types: 5 Cans of beer per week    Comment: occas   Drug use: No     MEDICATIONS    Home Medication:    Current Medication:  Current Facility-Administered Medications:    acetaminophen (TYLENOL) tablet 650 mg, 650 mg, Oral, Q6H PRN, 650 mg at 11/30/18 2129 **OR** acetaminophen (TYLENOL) suppository 650 mg, 650 mg, Rectal, Q6H PRN, Mansy, Jan A, MD   aspirin EC tablet 81 mg, 81 mg, Oral, Daily, Mansy, Jan A, MD, 81 mg at 11/30/18 0837   brimonidine (ALPHAGAN) 0.2 % ophthalmic solution 1 drop, 1 drop, Both Eyes, BID, Mansy, Jan A, MD, 1 drop at 11/30/18 2128   clopidogrel (PLAVIX) tablet 75 mg, 75 mg, Oral, Daily, Mansy, Jan A, MD, 75 mg at 11/30/18 [id number]   docusate sodium (COLACE) capsule 100 mg, 100 mg, Oral, BID, Nicole Kindred A, DO, 100 mg at 11/30/18 2130   dorzolamide (TRUSOPT) 2 % ophthalmic solution 1 drop, 1 drop, Both Eyes, BID, Mansy, Jan A, MD, 1 drop at 11/30/18 2134   enoxaparin (LOVENOX) injection 40 mg, 40 mg, Subcutaneous, Q24H, Jennye Boroughs, MD, 40 mg at 11/30/18 2131   furosemide (LASIX) injection  40 mg, 40 mg, Intravenous, BID, Lanney Gins, Annelie Boak, MD, 40 mg at 11/30/18 1741   hydrochlorothiazide (HYDRODIURIL) tablet 25 mg, 25 mg, Oral, Daily, Mansy, Jan A, MD, 25 mg at 11/30/18 1226   insulin aspart (novoLOG) injection 0-9 Units, 0-9 Units, Subcutaneous, TID WC, Mansy, Jan A, MD, 3 Units at 11/30/18 1742   insulin aspart (novoLOG) injection 9 Units, 9 Units, Subcutaneous, TID WC, Griffith, Kelly A, DO   insulin glargine (LANTUS) injection 8 Units, 8 Units, Subcutaneous, QHS, Nicole Kindred A, DO, 8 Units at 11/30/18 2133   ipratropium-albuterol (DUONEB) 0.5-2.5 (3) MG/3ML nebulizer solution 3 mL, 3 mL, Nebulization, TID, Arbutus Ped, Kelly A, DO, 3 mL at 12/01/18 0747   latanoprost (XALATAN) 0.005 % ophthalmic solution 1 drop, 1 drop, Both Eyes, QHS, Mansy, Jan A, MD, 1 drop at 11/30/18 2130   levofloxacin (LEVAQUIN) tablet 750 mg, 750 mg, Oral,  Daily, Lanney Gins, Mabry Santarelli, MD, 750 mg at 11/30/18 1747   lisinopril (ZESTRIL) tablet 40 mg, 40 mg, Oral, Daily, Mansy, Jan A, MD, 40 mg at 11/29/18 1016   magnesium hydroxide (MILK OF MAGNESIA) suspension 30 mL, 30 mL, Oral, Daily PRN, Mansy, Jan A, MD   magnesium oxide (MAG-OX) tablet 400 mg, 400 mg, Oral, Daily, Mansy, Jan A, MD, 400 mg at 11/30/18 [id number]   methylPREDNISolone sodium succinate (SOLU-MEDROL) 125 mg/2 mL injection 60 mg, 60 mg, Intravenous, Q12H, Ottie Glazier, MD, 60 mg at 12/01/18 0601   multivitamin with minerals tablet 1 tablet, 1 tablet, Oral, Daily, Jennye Boroughs, MD, 1 tablet at 11/30/18 [id number]   niacin (SLO-NIACIN) CR tablet 500 mg, 500 mg, Oral, QHS, Mansy, Jan A, MD, 500 mg at 11/30/18 2132   ondansetron (ZOFRAN) tablet 4 mg, 4 mg, Oral, Q6H PRN **OR** ondansetron (ZOFRAN) injection 4 mg, 4 mg, Intravenous, Q6H PRN, Mansy, Jan A, MD   polyvinyl alcohol (LIQUIFILM TEARS) 1.4 % ophthalmic solution 1 drop, 1 drop, Both Eyes, TID PRN, Mansy, Jan A, MD   protein supplement (ENSURE MAX) liquid, 11 oz, Oral, BID BM, Jennye Boroughs, MD, 11 oz at 11/30/18 1220   timolol (TIMOPTIC) 0.5 % ophthalmic solution 1 drop, 1 drop, Both Eyes, BID, Mansy, Jan A, MD, 1 drop at 11/30/18 2129   traZODone (DESYREL) tablet 25 mg, 25 mg, Oral, QHS PRN, Mansy, Arvella Merles, MD    ALLERGIES   Patient has no known allergies.     REVIEW OF SYSTEMS    Review of Systems:  Gen:  Denies  fever, sweats, chills weigh loss  HEENT: Denies blurred vision, double vision, ear pain, eye pain, hearing loss, nose bleeds, sore throat Cardiac:  No dizziness, chest pain or heaviness, chest tightness,edema Resp:   Denies cough or sputum porduction, shortness of breath,wheezing, hemoptysis,  Gi: Denies swallowing difficulty, stomach pain, nausea or vomiting, diarrhea, constipation, bowel incontinence Gu:  Denies bladder incontinence, burning urine Ext:   Denies Joint pain, stiffness or swelling Skin:  Denies  skin rash, easy bruising or bleeding or hives Endoc:  Denies polyuria, polydipsia , polyphagia or weight change Psych:   Denies depression, insomnia or hallucinations   Other:  All other systems negative   VS: BP (!) 93/56 (BP Location: Left Arm)    Pulse 97    Temp 97.9 F (36.6 C)    Resp 19    Ht 5\' 6"  (1.676 m)    Wt 69.4 kg Comment: taken with bed scale   SpO2 97%    BMI 24.69 kg/m      PHYSICAL  EXAM    GENERAL:NAD, no fevers, chills, no weakness no fatigue HEAD: Normocephalic, atraumatic.  EYES: Pupils equal, round, reactive to light. Extraocular muscles intact. No scleral icterus.  MOUTH: Moist mucosal membrane. Dentition intact. No abscess noted.  EAR, NOSE, THROAT: Clear without exudates. No external lesions.  NECK: Supple. No thyromegaly. No nodules. No JVD.  PULMONARY: bilateral rhonchi worse at bases improved from previous.  CARDIOVASCULAR: S1 and S2. Regular rate and rhythm. No murmurs, rubs, or gallops. No edema. Pedal pulses 2+ bilaterally.  GASTROINTESTINAL: Soft, nontender, nondistended. No masses. Positive bowel sounds. No hepatosplenomegaly.  MUSCULOSKELETAL: No swelling, clubbing, or edema. Range of motion full in all extremities.  NEUROLOGIC: Cranial nerves II through XII are intact. No gross focal neurological deficits. Sensation intact. Reflexes intact.  SKIN: No ulceration, lesions, rashes, or cyanosis. Skin warm and dry. Turgor intact.  PSYCHIATRIC: Mood, affect within normal limits. The patient is awake, alert and oriented x 3. Insight, judgment intact.       IMAGING    Ct Angio Chest Pe W And/or Wo Contrast  Result Date: 11/25/2018 CLINICAL DATA:  Shortness of breath beginning 4 weeks ago, gradually worsening. EXAM: CT ANGIOGRAPHY CHEST WITH CONTRAST TECHNIQUE: Multidetector CT imaging of the chest was performed using the standard protocol during bolus administration of intravenous contrast. Multiplanar CT image reconstructions and MIPs were  obtained to evaluate the vascular anatomy. CONTRAST:  82mL OMNIPAQUE IOHEXOL 350 MG/ML SOLN COMPARISON:  Current chest radiograph. FINDINGS: Cardiovascular: There is satisfactory opacification of the pulmonary arteries to the segmental level. There is no evidence of a pulmonary embolism. Heart is normal in size. Three-vessel coronary artery calcifications. No pericardial effusion. Great vessels normal in caliber. No aortic dissection. Aortic atherosclerosis. Mediastinum/Nodes: 9 mm hypoattenuating inferior right thyroid lobe nodule. No neck base or axillary masses or enlarged lymph nodes. Mediastinal adenopathy. Several reference measurements are made. 17 mm short axis prevascular node. Azygos level right paratracheal node measuring 17 mm in short axis. More superior right paratracheal node measuring 14 mm in short axis subcarinal node measuring 2.1 cm in short axis. Mildly enlarged hilar nodes, 1.6 cm short axis superior right hilar node and a 1.5 cm mid left hilar and 1.8 cm left infrahilar nodes. Trachea and esophagus are unremarkable.  Small hiatal hernia. Lungs/Pleura: Lungs demonstrate coarse heterogeneous interstitial thickening, areas of interspersed cystic change, architectural distortion and intervening areas of ground-glass opacity, the latter most prominent in the lower lobes. Cystic change appears to be underlying paraseptal and mild centrilobular emphysema. Minimal bronchiectasis. No lung mass or suspicious nodule. No pleural effusion or pneumothorax. Upper Abdomen: No acute findings. Prominent gastrohepatic ligament lymph nodes, largest 1 point 2 cm in short axis. Musculoskeletal: No fracture or acute finding. No osteoblastic or osteolytic lesions. Review of the MIP images confirms the above findings. IMPRESSION: 1. No evidence of a pulmonary embolism. 2. Extensive findings of interstitial lung disease include a prominent component of ground-glass opacity, the latter most evident in the lower lobes.  There is associated prominent mediastinal and less prominent hilar adenopathy. Mild underlying emphysema. 3. Chronic findings include three-vessel coronary artery calcifications and aortic atherosclerosis. Aortic Atherosclerosis (ICD10-I70.0) and Emphysema (ICD10-J43.9). Electronically Signed   By: Lajean Manes M.D.   On: 11/25/2018 18:24   Dg Chest Port 1 View  Result Date: 11/27/2018 CLINICAL DATA:  Acute respiratory failure EXAM: PORTABLE CHEST 1 VIEW COMPARISON:  11/25/2018, CT 11/25/2018 FINDINGS: Bilateral reticular and ground-glass opacity, consistent with chronic interstitial lung disease and fibrosis. Emphysematous disease. Ground-glass  opacity at the bases without significant change. Stable cardiomediastinal silhouette with aortic atherosclerosis. No pneumothorax IMPRESSION: No significant interval change since 11/25/2018. Extensive reticular and ground-glass opacity corresponding to interstitial lung disease and fibrosis on prior CT. There may be acute superimposed infection or inflammatory change at the lung bases. Similar appearance of cardiomegaly Electronically Signed   By: Donavan Foil M.D.   On: 11/27/2018 17:26   Dg Chest Port 1 View  Result Date: 11/25/2018 CLINICAL DATA:  Shortness of breath starting 4 weeks ago, gradually worsening. Former smoker. EXAM: PORTABLE CHEST 1 VIEW COMPARISON:  None. FINDINGS: Borderline cardiomegaly. Patchy bilateral airspace opacities, predominantly peripheral. No pleural effusion or pneumothorax seen. Osseous structures about the chest are unremarkable. IMPRESSION: 1. Borderline cardiomegaly. 2. Bilateral airspace opacities, of uncertain chronicity. Findings could represent chronic interstitial lung disease/fibrosis, acute interstitial edema or multifocal pneumonia. In the absence of fever, suspect some degree of acute edema superimposed on chronic interstitial lung disease/fibrosis. Electronically Signed   By: Franki Cabot M.D.   On: 11/25/2018 16:20               ASSESSMENT/PLAN   Acute hypoxemic respiratory faiure  -due to acute exacerbation of pulmonary fibrosis/ILD complicated by Asthma and COPD overlap syndrome.   -viral panel stat - negative   - stopping Lasix today  - Solumedrol - decreased to 40 BID today - plan for 50 Po prednisone daily tommorow until we see patient in clinic next week   - patient is DNR/DNI   -Pulmicort neublizer therapy   - DuoNEB - caution as patient is  tachycardic   - empiric antibiotics  -finished full course for CAP - PO Levoquin 750 daily- Dcd today  - Procalictonin -mildly elevated - blood cultures X2 -negative to date -4d -Arterial blood gas-severe hypoxemia -continue [place] with weaning parameters to goal SpO2>88 -repeat CXR in am - interval improvement as evidenced by pictorial documentation above -plan for repeat PT eval with oxygen qualification and medical optimization for D/C home with O2 if possible -discussed care plan with Dr Arbutus Ped today.     Thank you for allowing me to participate in the care of this patient.    Patient/Family are satisfied with care plan and all questions have been answered.  This document was prepared using Dragon voice recognition software and may include unintentional dictation errors.     Ottie Glazier, M.D.  Division of [employer]

## 2018-12-02 ENCOUNTER — Inpatient Hospital Stay: Payer: Medicare HMO

## 2018-12-02 DIAGNOSIS — E119 Type 2 diabetes mellitus without complications: Secondary | ICD-10-CM

## 2018-12-02 DIAGNOSIS — I10 Essential (primary) hypertension: Secondary | ICD-10-CM

## 2018-12-02 LAB — BASIC METABOLIC PANEL
Anion gap: 10 (ref 5–15)
BUN: 83 mg/dL — ABNORMAL HIGH (ref 8–23)
CO2: 29 mmol/L (ref 22–32)
Calcium: 9.7 mg/dL (ref 8.9–10.3)
Chloride: 88 mmol/L — ABNORMAL LOW (ref 98–111)
Creatinine, Ser: 1.02 mg/dL (ref 0.61–1.24)
GFR calc Af Amer: >60 mL/min (ref >60)
GFR calc non Af Amer: >60 mL/min (ref >60)
Glucose, Bld: 262 mg/dL — ABNORMAL HIGH (ref 70–99)
Potassium: 5 mmol/L (ref 3.5–5.1)
Sodium: 127 mmol/L — ABNORMAL LOW (ref 135–145)

## 2018-12-02 LAB — CBC WITH DIFFERENTIAL/PLATELET
Abs Immature Granulocytes: 0.35 10*3/uL — ABNORMAL HIGH (ref 0.00–0.07)
Basophils Absolute: 0.1 10*3/uL (ref 0.0–0.1)
Basophils Relative: 0 %
Eosinophils Absolute: 0 10*3/uL (ref 0.0–0.5)
Eosinophils Relative: 0 %
HCT: 44.5 % (ref 39.0–52.0)
Hemoglobin: 14.9 g/dL (ref 13.0–17.0)
Immature Granulocytes: 1 %
Lymphocytes Relative: 11 %
Lymphs Abs: 3 10*3/uL (ref 0.7–4.0)
MCH: 27.1 pg (ref 26.0–34.0)
MCHC: 33.5 g/dL (ref 30.0–36.0)
MCV: 81.1 fL (ref 80.0–100.0)
Monocytes Absolute: 2.5 10*3/uL — ABNORMAL HIGH (ref 0.1–1.0)
Monocytes Relative: 9 %
Neutro Abs: 22.3 10*3/uL — ABNORMAL HIGH (ref 1.7–7.7)
Neutrophils Relative %: 79 %
Platelets: 569 10*3/uL — ABNORMAL HIGH (ref 150–400)
RBC: 5.49 MIL/uL (ref 4.22–5.81)
RDW: 14.2 % (ref 11.5–15.5)
Smear Review: NORMAL
WBC: 28.2 10*3/uL — ABNORMAL HIGH (ref 4.0–10.5)
nRBC: 0 % (ref 0.0–0.2)

## 2018-12-02 LAB — GLUCOSE, CAPILLARY
Glucose-Capillary: 313 mg/dL — ABNORMAL HIGH (ref 70–99)
Glucose-Capillary: 303 mg/dL — ABNORMAL HIGH (ref 70–99)
Glucose-Capillary: 209 mg/dL — ABNORMAL HIGH (ref 70–99)
Glucose-Capillary: 140 mg/dL — ABNORMAL HIGH (ref 70–99)

## 2018-12-02 MED ORDER — INSULIN ASPART 100 UNIT/ML ~~LOC~~ SOLN
0.0000 [IU] | Freq: Three times a day (TID) | SUBCUTANEOUS | Status: DC
  Administered 2018-12-02: 5 [IU] via SUBCUTANEOUS
  Administered 2018-12-03 – 2018-12-04 (×3): 3 [IU] via SUBCUTANEOUS
  Filled 2018-12-02 (×4): qty 1

## 2018-12-02 MED ORDER — INSULIN GLARGINE 100 UNIT/ML ~~LOC~~ SOLN
20.0000 [IU] | Freq: Every day | SUBCUTANEOUS | Status: DC
  Administered 2018-12-02 – 2018-12-03 (×2): 20 [IU] via SUBCUTANEOUS
  Filled 2018-12-02 (×3): qty 0.2

## 2018-12-02 MED ORDER — SODIUM CHLORIDE 0.9 % IV BOLUS
500.0000 mL | Freq: Once | INTRAVENOUS | Status: AC
  Administered 2018-12-02: 500 mL via INTRAVENOUS

## 2018-12-02 MED ORDER — LACTULOSE 10 GM/15ML PO SOLN
20.0000 g | Freq: Two times a day (BID) | ORAL | Status: DC | PRN
  Administered 2018-12-02 (×2): 20 g via ORAL
  Filled 2018-12-02 (×2): qty 30

## 2018-12-02 MED ORDER — PREDNISONE 50 MG PO TABS
50.0000 mg | ORAL_TABLET | Freq: Every day | ORAL | Status: DC
  Administered 2018-12-03 – 2018-12-04 (×2): 50 mg via ORAL
  Filled 2018-12-02 (×2): qty 1

## 2018-12-02 MED ORDER — PANTOPRAZOLE SODIUM 40 MG PO TBEC
40.0000 mg | DELAYED_RELEASE_TABLET | Freq: Every day | ORAL | Status: DC
  Administered 2018-12-02 – 2018-12-04 (×3): 40 mg via ORAL
  Filled 2018-12-02 (×3): qty 1

## 2018-12-02 MED ORDER — INSULIN ASPART 100 UNIT/ML ~~LOC~~ SOLN
15.0000 [IU] | Freq: Three times a day (TID) | SUBCUTANEOUS | Status: DC
  Administered 2018-12-02 – 2018-12-04 (×7): 15 [IU] via SUBCUTANEOUS
  Filled 2018-12-02 (×6): qty 1

## 2018-12-02 NOTE — Progress Notes (Signed)
Pulmonary Medicine          Date: 12/02/2018,   MRN# [id number] Gerald Clark Nov 14, 1945     AdmissionWeight: 72.6 kg                 CurrentWeight: 69.4 kg(taken with bed scale)      CHIEF COMPLAINT:   Acute hypoxemic respiratory failure   SUBJECTIVE    Patient sitting up with wife at bedside.  He is down to 2L/min NS.  He did not do well with exertional walk today around the hallway but overall he is significantly improved.  I anticipate a prolonged recovery at home but we can start d/c planning with home O2  PAST MEDICAL HISTORY   Past Medical History:  Diagnosis Date   Arthritis    Asthma    childhood asthma   Cancer ([place])    Basal Cell Skin Cancer   Diabetes mellitus without complication ([place])    GERD (gastroesophageal reflux disease)    Glaucoma    History of chicken pox    Hyperlipemia    Hypertension    Peripheral vascular disease ([place])      SURGICAL HISTORY   Past Surgical History:  Procedure Laterality Date   COLONOSCOPY N/A 06/02/2014   Procedure: COLONOSCOPY;  Surgeon: Josefine Class, MD;  Location: [facility] ENDOSCOPY;  Service: Endoscopy;  Laterality: N/A;   INGUINAL HERNIA REPAIR Left 06/28/2016   Procedure: HERNIA REPAIR INGUINAL ADULT WITH MESH;  Surgeon: Leonie Green, MD;  Location: [facility] ORS;  Service: General;  Laterality: Left;   LOWER EXTREMITY ANGIOGRAPHY Right 10/24/2016   Procedure: Lower Extremity Angiography;  Surgeon: Algernon Huxley, MD;  Location: [facility];  Service: Cardiovascular;  Laterality: Right;     FAMILY HISTORY   Family History  Problem Relation Age of Onset   Hypertension Mother    Hypertension Father    Cancer Father    Breast cancer Sister    Cancer Sister      SOCIAL HISTORY   Social History   Tobacco Use   Smoking status: Former Smoker    Packs/day: 2.00    Types: Cigarettes    Quit date: 04/25/2011    Years since quitting: 7.6   Smokeless tobacco:  Never Used  Substance Use Topics   Alcohol use: Yes    Alcohol/week: 5.0 standard drinks    Types: 5 Cans of beer per week    Comment: occas   Drug use: No     MEDICATIONS    Home Medication:    Current Medication:  Current Facility-Administered Medications:    acetaminophen (TYLENOL) tablet 650 mg, 650 mg, Oral, Q6H PRN, 650 mg at 12/01/18 0851 **OR** acetaminophen (TYLENOL) suppository 650 mg, 650 mg, Rectal, Q6H PRN, Mansy, Jan A, MD   aspirin EC tablet 81 mg, 81 mg, Oral, Daily, Mansy, Jan A, MD, 81 mg at 12/02/18 0814   brimonidine (ALPHAGAN) 0.2 % ophthalmic solution 1 drop, 1 drop, Both Eyes, BID, Mansy, Jan A, MD, 1 drop at 12/02/18 0820   clopidogrel (PLAVIX) tablet 75 mg, 75 mg, Oral, Daily, Mansy, Jan A, MD, 75 mg at 12/02/18 0816   docusate sodium (COLACE) capsule 100 mg, 100 mg, Oral, BID, Nicole Kindred A, DO, 100 mg at 12/02/18 0815   dorzolamide (TRUSOPT) 2 % ophthalmic solution 1 drop, 1 drop, Both Eyes, BID, Mansy, Jan A, MD, 1 drop at 12/02/18 0822   enoxaparin (LOVENOX) injection 40 mg, 40 mg, Subcutaneous,  Q24H, Jennye Boroughs, MD, 40 mg at 12/01/18 2141   insulin aspart (novoLOG) injection 0-15 Units, 0-15 Units, Subcutaneous, TID WC, Nicole Kindred A, DO   insulin aspart (novoLOG) injection 15 Units, 15 Units, Subcutaneous, TID WC, Ezekiel Slocumb, DO, 15 Units at 12/02/18 1216   insulin glargine (LANTUS) injection 20 Units, 20 Units, Subcutaneous, QHS, Griffith, Kelly A, DO   ipratropium-albuterol (DUONEB) 0.5-2.5 (3) MG/3ML nebulizer solution 3 mL, 3 mL, Nebulization, TID, Arbutus Ped, Kelly A, DO, 3 mL at 12/02/18 1418   lactulose (CHRONULAC) 10 GM/15ML solution 20 g, 20 g, Oral, BID PRN, Nicole Kindred A, DO, 20 g at 12/02/18 1344   latanoprost (XALATAN) 0.005 % ophthalmic solution 1 drop, 1 drop, Both Eyes, QHS, Mansy, Jan A, MD, 1 drop at 12/01/18 2141   magnesium hydroxide (MILK OF MAGNESIA) suspension 30 mL, 30 mL, Oral, Daily PRN,  Mansy, Jan A, MD, 30 mL at 12/02/18 0816   magnesium oxide (MAG-OX) tablet 400 mg, 400 mg, Oral, Daily, Mansy, Jan A, MD, 400 mg at 12/02/18 [id number]   methylPREDNISolone sodium succinate (SOLU-MEDROL) 40 mg/mL injection 40 mg, 40 mg, Intravenous, Q12H, Ka Flammer, MD, 40 mg at 12/02/18 0409   multivitamin with minerals tablet 1 tablet, 1 tablet, Oral, Daily, Jennye Boroughs, MD, 1 tablet at 12/02/18 0816   niacin (SLO-NIACIN) CR tablet 500 mg, 500 mg, Oral, QHS, Mansy, Jan A, MD, 500 mg at 12/01/18 2140   ondansetron (ZOFRAN) tablet 4 mg, 4 mg, Oral, Q6H PRN **OR** ondansetron (ZOFRAN) injection 4 mg, 4 mg, Intravenous, Q6H PRN, Mansy, Jan A, MD, 4 mg at 12/02/18 0443   pantoprazole (PROTONIX) EC tablet 40 mg, 40 mg, Oral, Daily, Nicole Kindred A, DO, 40 mg at 12/02/18 [id number]   polyvinyl alcohol (LIQUIFILM TEARS) 1.4 % ophthalmic solution 1 drop, 1 drop, Both Eyes, TID PRN, Mansy, Jan A, MD   protein supplement (ENSURE MAX) liquid, 11 oz, Oral, BID BM, Jennye Boroughs, MD, 11 oz at 12/02/18 1345   senna (SENOKOT) tablet 8.6 mg, 1 tablet, Oral, Daily PRN, Nicole Kindred A, DO, 8.6 mg at 12/02/18 0815   sodium chloride flush (NS) 0.9 % injection 3 mL, 3 mL, Intravenous, Q12H, Griffith, Kelly A, DO, 3 mL at 12/02/18 0826   sodium chloride flush (NS) 0.9 % injection 3 mL, 3 mL, Intravenous, PRN, Nicole Kindred A, DO, 3 mL at 12/01/18 1710   timolol (TIMOPTIC) 0.5 % ophthalmic solution 1 drop, 1 drop, Both Eyes, BID, Mansy, Jan A, MD, 1 drop at 12/02/18 0817   traZODone (DESYREL) tablet 25 mg, 25 mg, Oral, QHS PRN, Mansy, Jan A, MD, 25 mg at 12/01/18 2140    ALLERGIES   Patient has no known allergies.     REVIEW OF SYSTEMS    Review of Systems:  Gen:  Denies  fever, sweats, chills weigh loss  HEENT: Denies blurred vision, double vision, ear pain, eye pain, hearing loss, nose bleeds, sore throat Cardiac:  No dizziness, chest pain or heaviness, chest tightness,edema Resp:    Denies cough or sputum porduction, shortness of breath,wheezing, hemoptysis,  Gi: Denies swallowing difficulty, stomach pain, nausea or vomiting, diarrhea, constipation, bowel incontinence Gu:  Denies bladder incontinence, burning urine Ext:   Denies Joint pain, stiffness or swelling Skin: Denies  skin rash, easy bruising or bleeding or hives Endoc:  Denies polyuria, polydipsia , polyphagia or weight change Psych:   Denies depression, insomnia or hallucinations   Other:  All other systems negative   VS: BP Marland Kitchen)  118/94    Pulse 63    Temp 97.7 F (36.5 C) (Axillary)    Resp 20    Ht 5\' 6"  (1.676 m)    Wt 69.4 kg Comment: taken with bed scale   SpO2 99%    BMI 24.69 kg/m      PHYSICAL EXAM    GENERAL:NAD, no fevers, chills, no weakness no fatigue HEAD: Normocephalic, atraumatic.  EYES: Pupils equal, round, reactive to light. Extraocular muscles intact. No scleral icterus.  MOUTH: Moist mucosal membrane. Dentition intact. No abscess noted.  EAR, NOSE, THROAT: Clear without exudates. No external lesions.  NECK: Supple. No thyromegaly. No nodules. No JVD.  PULMONARY: bilateral rhonchi worse at bases improved from previous.  CARDIOVASCULAR: S1 and S2. Regular rate and rhythm. No murmurs, rubs, or gallops. No edema. Pedal pulses 2+ bilaterally.  GASTROINTESTINAL: Soft, nontender, nondistended. No masses. Positive bowel sounds. No hepatosplenomegaly.  MUSCULOSKELETAL: No swelling, clubbing, or edema. Range of motion full in all extremities.  NEUROLOGIC: Cranial nerves II through XII are intact. No gross focal neurological deficits. Sensation intact. Reflexes intact.  SKIN: No ulceration, lesions, rashes, or cyanosis. Skin warm and dry. Turgor intact.  PSYCHIATRIC: Mood, affect within normal limits. The patient is awake, alert and oriented x 3. Insight, judgment intact.       IMAGING    Ct Angio Chest Pe W And/or Wo Contrast  Result Date: 11/25/2018 CLINICAL DATA:  Shortness of breath  beginning 4 weeks ago, gradually worsening. EXAM: CT ANGIOGRAPHY CHEST WITH CONTRAST TECHNIQUE: Multidetector CT imaging of the chest was performed using the standard protocol during bolus administration of intravenous contrast. Multiplanar CT image reconstructions and MIPs were obtained to evaluate the vascular anatomy. CONTRAST:  54mL OMNIPAQUE IOHEXOL 350 MG/ML SOLN COMPARISON:  Current chest radiograph. FINDINGS: Cardiovascular: There is satisfactory opacification of the pulmonary arteries to the segmental level. There is no evidence of a pulmonary embolism. Heart is normal in size. Three-vessel coronary artery calcifications. No pericardial effusion. Great vessels normal in caliber. No aortic dissection. Aortic atherosclerosis. Mediastinum/Nodes: 9 mm hypoattenuating inferior right thyroid lobe nodule. No neck base or axillary masses or enlarged lymph nodes. Mediastinal adenopathy. Several reference measurements are made. 17 mm short axis prevascular node. Azygos level right paratracheal node measuring 17 mm in short axis. More superior right paratracheal node measuring 14 mm in short axis subcarinal node measuring 2.1 cm in short axis. Mildly enlarged hilar nodes, 1.6 cm short axis superior right hilar node and a 1.5 cm mid left hilar and 1.8 cm left infrahilar nodes. Trachea and esophagus are unremarkable.  Small hiatal hernia. Lungs/Pleura: Lungs demonstrate coarse heterogeneous interstitial thickening, areas of interspersed cystic change, architectural distortion and intervening areas of ground-glass opacity, the latter most prominent in the lower lobes. Cystic change appears to be underlying paraseptal and mild centrilobular emphysema. Minimal bronchiectasis. No lung mass or suspicious nodule. No pleural effusion or pneumothorax. Upper Abdomen: No acute findings. Prominent gastrohepatic ligament lymph nodes, largest 1 point 2 cm in short axis. Musculoskeletal: No fracture or acute finding. No osteoblastic  or osteolytic lesions. Review of the MIP images confirms the above findings. IMPRESSION: 1. No evidence of a pulmonary embolism. 2. Extensive findings of interstitial lung disease include a prominent component of ground-glass opacity, the latter most evident in the lower lobes. There is associated prominent mediastinal and less prominent hilar adenopathy. Mild underlying emphysema. 3. Chronic findings include three-vessel coronary artery calcifications and aortic atherosclerosis. Aortic Atherosclerosis (ICD10-I70.0) and Emphysema (ICD10-J43.9). Electronically Signed  By: Lajean Manes M.D.   On: 11/25/2018 18:24   Dg Chest Port 1 View  Result Date: 12/01/2018 CLINICAL DATA:  Former smoker, admission 11-27-18 due to acute exacerbation of 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 Aeration of the bilateral lungs appears mildly improved. There are coarse bilateral interstitial opacities likely representing underlying pulmonary fibrosis. No pneumothorax or large pleural effusion. IMPRESSION: 1. Mildly improved aeration of the bilateral lungs which may represent resolving acute infection or inflammation. 2. Bilateral interstitial opacities most likely representing underlying pulmonary fibrosis. Electronically Signed   By: Audie Pinto M.D.   On: 12/01/2018 10:46   Dg Chest Port 1 View  Result Date: 11/27/2018 CLINICAL DATA:  Acute respiratory failure EXAM: PORTABLE CHEST 1 VIEW COMPARISON:  11/25/2018, CT 11/25/2018 FINDINGS: Bilateral reticular and ground-glass opacity, consistent with chronic interstitial lung disease and fibrosis. Emphysematous disease. Ground-glass opacity at the bases without significant change. Stable cardiomediastinal silhouette with aortic atherosclerosis. No pneumothorax IMPRESSION: No significant interval change since  11/25/2018. Extensive reticular and ground-glass opacity corresponding to interstitial lung disease and fibrosis on prior CT. There may be acute superimposed infection or inflammatory change at the lung bases. Similar appearance of cardiomegaly Electronically Signed   By: Donavan Foil M.D.   On: 11/27/2018 17:26   Dg Chest Port 1 View  Result Date: 11/25/2018 CLINICAL DATA:  Shortness of breath starting 4 weeks ago, gradually worsening. Former smoker. EXAM: PORTABLE CHEST 1 VIEW COMPARISON:  None. FINDINGS: Borderline cardiomegaly. Patchy bilateral airspace opacities, predominantly peripheral. No pleural effusion or pneumothorax seen. Osseous structures about the chest are unremarkable. IMPRESSION: 1. Borderline cardiomegaly. 2. Bilateral airspace opacities, of uncertain chronicity. Findings could represent chronic interstitial lung disease/fibrosis, acute interstitial edema or multifocal pneumonia. In the absence of fever, suspect some degree of acute edema superimposed on chronic interstitial lung disease/fibrosis. Electronically Signed   By: Franki Cabot M.D.   On: 11/25/2018 16:20              ASSESSMENT/PLAN   Acute hypoxemic respiratory faiure  -due to acute exacerbation of pulmonary fibrosis/ILD complicated by Asthma and COPD overlap syndrome.   -viral panel stat - negative   - stopping Lasix today  - Solumedrol - decreased to 40 BID today - plan for 50 Po prednisone daily tommorow until we see patient in clinic next week   - patient is DNR/DNI   -Pulmicort neublizer therapy   - DuoNEB - caution as patient is  tachycardic   - empiric antibiotics  -finished full course for CAP - PO Levoquin 750 daily- Dcd today  - Procalictonin -mildly elevated - blood cultures X2 -negative to date -4d -Arterial blood gas-severe hypoxemia -continue [place] with weaning parameters to goal SpO2>88 -repeat CXR in am - interval improvement as evidenced by pictorial documentation above -s/p PT eval -  BORG6 symptoms within 1 minute of walk.     Thank you for allowing me to participate in the care of this patient.    Patient/Family are satisfied with care plan and all questions have been answered.  This document was prepared using Dragon voice recognition software and may include unintentional dictation errors.     Ottie Glazier, M.D.  Division of [place]

## 2018-12-02 NOTE — Progress Notes (Addendum)
PROGRESS NOTE    Gerald Clark  [id number] DOB: 09-Mar-1945 DOA: 11/25/2018  PCP: Adin Hector, MD    LOS - 7   Brief Narrative: 73 y.o.Caucasian malewith history of diabetes mellitus, hypertension, dyslipidemia peripheral vascular disease, who presented to the emergency room with acuteworseningof dyspneathat had been ongoing forthe last 4 weeks.No associated cough or wheezing, N/V/D, loss of taste or smell. Subjective chills but afebrile. No sick contacts or COVID exposure. In the ED, patient was tachypneic and hypoxic with spO2 72% on room air which improved with NRB mask. CTA chest ruled out PE, but did show extensive interstitial lung disease including a prominent ground glass opacity in lower lobes, with mediastinal and hilar lymphadenopathy, and mild underlying emphysema. COVID-19 tested and negative. He was treated with IV Solu-medrol and IV fluids before admission. Pulmonology Consulted. Has continued on IV steroids, diuresis and supplemental oxygen, currently weaning down.  Subjective 11/8: Patient seen awake and sitting up in.  Reports feeling fairly well today.  Did report he vomited early this morning, denies nausea at this time.  Has had emesis in the morning on a couple of occasions now, states he thinks he may have a hiatal hernia, and has epigastric pain with swallowing at times.  Reports constipation.  Denies fevers, chills, chest pain or other acute complaints  Assessment & Plan:   Principal Problem:   Acute hypoxemic respiratory failure (HCC) Active Problems:   Hypertension   Diabetes mellitus without complication (HCC)   PAD (peripheral artery disease) (HCC)   CAP (community acquired pneumonia)   Community acquired pneumonia   Hypoxia   Interstitial lung disease ([place])   Bandemia   Mediastinal adenopathy   Malnutrition of moderate degree   Acute hypoxemic respiratory failure:apparently secondary to exacerbation of underlying,  previously undiagnosed(?) interstitial lung disease.  Interstitial infiltrates? Community-acquired pneumonia:vs above (interstitial lung disease/pulmonary fibrosis with exacerbation).Coronavirus test is negative x2. - Pulmonology following, appreciate recs:             - continue supplemental O2 for spO2 > 88%             - decreased Solu-medrol to 40 BID today, transition to PO prednisone tomorrow             - stopped POLevaquin, course completed for CA-PNA             - stopped lasix today -Patient requires 4 L/min oxygen at rest, 4-5 L/min with ambulation  Constipation -patient reports no BM since despite Colace, milk of magnesia.  We will add lactulose today.  Hyponatremia - hypertonic, corrects from 127 to 130 given hyperglycemia.  Suspect diuresis contributory.   - stopped diuresis 11/7 -Recheck BMP tomorrow  Non-sustained V-tach(on telemetry 11/4): patient asymptomatic at the time.  - maintain K>4.0, Mg>2.0 - keep on telemetry  Mediastinal and hilar lymphadenopathy: Follow-up with pulmonologist for further recommendations.  Hypokalemia:resolved.Repeat BMP. Maintain K>4.0.  Leukocytosis:secondary to steroids. Monitor withCBC's.  Peripheral vascular disease: - continue homeaspirin and Plavix   Hypertension: Recently with soft blood pressures.  -HoldHCTZ and lisinopril for now  Type II DM: Hyperglycemia secondary to steroids Metformin, Actos and glipizide have been held for now. Have been titrating up insulin past few days, however still worse hyperglycemia 300s at times.  We will continue monitor and titrate up as needed.  Patient not on insulin at baseline, states he thinks he can self administer if needed after discharge.  Will likely need to be on insulin while continued  on steroids. - increased short acting with meals again (to 15 units) - increased Lantus (to 20 units) - increased sliding scale insulin to moderate    DVT prophylaxis:  Lovenox   Code Status: DNR  Family Communication: none at bedside Disposition Plan:   PT recommends home health PT.  Expect home in 1 to 2 days after transitioning to oral steroids and home oxygen has been set up, per pulmonology   Consultants:   Pulmonology  Procedures:   none  Antimicrobials:   Levaquin PO - completed course 11/2-11/7  Rocephin & Azithromycin 11/1-11/2   Objective: Vitals:   12/01/18 2022 12/02/18 0048 12/02/18 0437 12/02/18 0449  BP:   127/69   Pulse:  89 99   Resp:   18   Temp:   (!) 96.8 F (36 C) 97.6 F (36.4 C)  TempSrc:   Axillary Axillary  SpO2: 95%  93%   Weight:      Height:        Intake/Output Summary (Last 24 hours) at 12/02/2018 0717 Last data filed at 12/02/2018 0445 Gross per 24 hour  Intake 600 ml  Output 2025 ml  Net -1425 ml   Filed Weights   11/25/18 1517 11/27/18 1528  Weight: 72.6 kg 69.4 kg    Examination:  General exam: awake, alert, no acute distress HEENT: atraumatic, clear conjunctiva, moist mucus membranes, hearing grossly normal  Respiratory system: Decreased breath sounds bilaterally, faint crackles at the bases, no wheezes or rhonchi, normal respiratory effort. Cardiovascular system: normal S1/S2, RRR, no JVD, murmurs, rubs, gallops,  no pedal edema.   Gastrointestinal system: soft, non-tender, non-distended abdomen Central nervous system: alert and oriented x4. no gross focal neurologic deficits, normal speech Extremities: moves all, no edema, normal tone Skin: dry, intact, normal temperature Psychiatry: normal mood, congruent affect, judgement and insight appear normal    Data Reviewed: I have personally reviewed following labs and imaging studies  CBC: Recent Labs  Lab 11/25/18 1527  11/27/18 1351 11/29/18 0406 11/30/18 0431 12/01/18 0408 12/02/18 0435  WBC 16.4*   < > 19.0* 19.1* 19.6* 20.4* 28.2*  NEUTROABS 13.0*  --  16.5*  --  15.5* 16.6* 22.3*  HGB 14.4   < > 13.8 14.0 14.5 15.2 14.9   HCT 44.1   < > 41.8 43.4 45.1 46.1 44.5  MCV 85.1   < > 83.6 82.7 83.2 82.6 81.1  PLT 416*   < > 454* 577* 537* 537* 569*   < > = values in this interval not displayed.   Basic Metabolic Panel: Recent Labs  Lab 11/25/18 2236  11/27/18 1351 11/28/18 1445 11/29/18 0406 11/30/18 0431 12/01/18 0408 12/02/18 0435  NA  --    < > 133*  --  132* 131* 129* 127*  K  --    < > 3.8 3.6 3.9 4.1 3.9 5.0  CL  --    < > 96*  --  94* 94* 90* 88*  CO2  --    < > 23  --  28 27 26 29   GLUCOSE  --    < > 246*  --  222* 222* 232* 262*  BUN  --    < > 40*  --  54* 68* 67* 83*  CREATININE  --    < > 0.99  --  0.86 1.00 0.99 1.02  CALCIUM  --    < > 9.3  --  9.7 9.3 9.6 9.7  MG 1.9  --   --  2.4  --  2.7*  --   --    < > = values in this interval not displayed.   GFR: Estimated Creatinine Clearance: 58.2 mL/min (by C-G formula based on SCr of 1.02 mg/dL). Liver Function Tests: Recent Labs  Lab 11/26/18 1003  AST 38  ALT 18  ALKPHOS 59  BILITOT 1.4*  PROT 8.2*  ALBUMIN 2.9*   No results for input(s): LIPASE, AMYLASE in the last 168 hours. No results for input(s): AMMONIA in the last 168 hours. Coagulation Profile: Recent Labs  Lab 11/28/18 0339  INR 1.3*   Cardiac Enzymes: No results for input(s): CKTOTAL, CKMB, CKMBINDEX, TROPONINI in the last 168 hours. BNP (last 3 results) No results for input(s): PROBNP in the last 8760 hours. HbA1C: No results for input(s): HGBA1C in the last 72 hours. CBG: Recent Labs  Lab 11/30/18 1153 11/30/18 1618 11/30/18 2105 12/01/18 1144 12/01/18 1657  GLUCAP 355* 209* 179* 393* 181*   Lipid Profile: No results for input(s): CHOL, HDL, LDLCALC, TRIG, CHOLHDL, LDLDIRECT in the last 72 hours. Thyroid Function Tests: No results for input(s): TSH, T4TOTAL, FREET4, T3FREE, THYROIDAB in the last 72 hours. Anemia Panel: No results for input(s): VITAMINB12, FOLATE, FERRITIN, TIBC, IRON, RETICCTPCT in the last 72 hours. Sepsis Labs: Recent Labs  Lab  11/25/18 1527 11/25/18 1735 11/26/18 1638 11/27/18 0409 11/28/18 0339  PROCALCITON  --   --  0.11 0.16 0.11  LATICACIDVEN 2.9* 1.9  --   --   --     Recent Results (from the past 240 hour(s))  SARS Coronavirus 2 by RT PCR (hospital order, performed in [place] hospital lab) Nasopharyngeal Nasopharyngeal Swab     Status: None   Collection Time: 11/25/18  3:27 PM   Specimen: Nasopharyngeal Swab  Result Value Ref Range Status   SARS Coronavirus 2 NEGATIVE NEGATIVE Final    Comment: (NOTE) If result is NEGATIVE SARS-CoV-2 target nucleic acids are NOT DETECTED. The SARS-CoV-2 RNA is generally detectable in upper and lower  respiratory specimens during the acute phase of infection. The lowest  concentration of SARS-CoV-2 viral copies this assay can detect is 250  copies / mL. A negative result does not preclude SARS-CoV-2 infection  and should not be used as the sole basis for treatment or other  patient management decisions.  A negative result may occur with  improper specimen collection / handling, submission of specimen other  than nasopharyngeal swab, presence of viral mutation(s) within the  areas targeted by this assay, and inadequate number of viral copies  (<250 copies / mL). A negative result must be combined with clinical  observations, patient history, and epidemiological information. If result is POSITIVE SARS-CoV-2 target nucleic acids are DETECTED. The SARS-CoV-2 RNA is generally detectable in upper and lower  respiratory specimens dur ing the acute phase of infection.  Positive  results are indicative of active infection with SARS-CoV-2.  Clinical  correlation with patient history and other diagnostic information is  necessary to determine patient infection status.  Positive results do  not rule out bacterial infection or co-infection with other viruses. If result is PRESUMPTIVE POSTIVE SARS-CoV-2 nucleic acids MAY BE PRESENT.   A presumptive positive result was  obtained on the submitted specimen  and confirmed on repeat testing.  While 2019 novel coronavirus  (SARS-CoV-2) nucleic acids may be present in the submitted sample  additional confirmatory testing may be necessary for epidemiological  and / or clinical management purposes  to differentiate between  SARS-CoV-2  and other Sarbecovirus currently known to infect humans.  If clinically indicated additional testing with an alternate test  methodology [phone]) is advised. The SARS-CoV-2 RNA is generally  detectable in upper and lower respiratory sp ecimens during the acute  phase of infection. The expected result is Negative. Fact Sheet for Patients:  StrictlyIdeas.no Fact Sheet for Healthcare Providers: BankingDealers.co.za This test is not yet approved or cleared by the [place] FDA and has been authorized for detection and/or diagnosis of SARS-CoV-2 by FDA under an Emergency Use Authorization (EUA).  This EUA will remain in effect (meaning this test can be used) for the duration of the COVID-19 declaration under Section 564(b)(1) of the Act, 21 U.S.C. section 360bbb-3(b)(1), unless the authorization is terminated or revoked sooner. Performed at [facility], [address] [place], [place] [address]   Blood culture (routine x 2)     Status: None (Preliminary result)   Collection Time: 11/25/18  3:27 PM   Specimen: BLOOD  Result Value Ref Range Status   Specimen Description BLOOD LEFT ANTECUBITAL  Final   Special Requests   Final    BOTTLES DRAWN AEROBIC AND ANAEROBIC Blood Culture adequate volume   Culture   Final    NO GROWTH 4 DAYS Performed at [facility], [address] [place], [place] [address]    Report Status PENDING  Incomplete  Blood culture (routine x 2)     Status: None (Preliminary result)   Collection Time: 11/25/18  7:40 PM   Specimen: BLOOD  Result Value Ref Range Status   Specimen  Description BLOOD RIGHT ANTECUBITAL  Final   Special Requests   Final    BOTTLES DRAWN AEROBIC AND ANAEROBIC Blood Culture results may not be optimal due to an excessive volume of blood received in culture bottles   Culture   Final    NO GROWTH 4 DAYS Performed at [facility] [place], [address], [place], [place] [address]    Report Status PENDING  Incomplete  SARS Coronavirus 2 by RT PCR (hospital order, performed in [place] hospital lab) Nasopharyngeal Nasopharyngeal Swab     Status: None   Collection Time: 11/26/18 10:03 AM   Specimen: Nasopharyngeal Swab  Result Value Ref Range Status   SARS Coronavirus 2 NEGATIVE NEGATIVE Final    Comment: (NOTE) If result is NEGATIVE SARS-CoV-2 target nucleic acids are NOT DETECTED. The SARS-CoV-2 RNA is generally detectable in upper and lower  respiratory specimens during the acute phase of infection. The lowest  concentration of SARS-CoV-2 viral copies this assay can detect is 250  copies / mL. A negative result does not preclude SARS-CoV-2 infection  and should not be used as the sole basis for treatment or other  patient management decisions.  A negative result may occur with  improper specimen collection / handling, submission of specimen other  than nasopharyngeal swab, presence of viral mutation(s) within the  areas targeted by this assay, and inadequate number of viral copies  (<250 copies / mL). A negative result must be combined with clinical  observations, patient history, and epidemiological information. If result is POSITIVE SARS-CoV-2 target nucleic acids are DETECTED. The SARS-CoV-2 RNA is generally detectable in upper and lower  respiratory specimens dur ing the acute phase of infection.  Positive  results are indicative of active infection with SARS-CoV-2.  Clinical  correlation with patient history and other diagnostic information is  necessary to determine patient infection status.  Positive results do  not rule  out bacterial infection or co-infection with other viruses. If result is PRESUMPTIVE POSTIVE SARS-CoV-2 nucleic acids MAY BE PRESENT.   A presumptive positive result was obtained on the submitted specimen  and confirmed on repeat testing.  While 2019 novel coronavirus  (SARS-CoV-2) nucleic acids may be present in the submitted sample  additional confirmatory testing may be necessary for epidemiological  and / or clinical management purposes  to differentiate between  SARS-CoV-2 and other Sarbecovirus currently known to infect humans.  If clinically indicated additional testing with an alternate test  methodology [phone]) is advised. The SARS-CoV-2 RNA is generally  detectable in upper and lower respiratory sp ecimens during the acute  phase of infection. The expected result is Negative. Fact Sheet for Patients:  StrictlyIdeas.no Fact Sheet for Healthcare Providers: BankingDealers.co.za This test is not yet approved or cleared by the [place] FDA and has been authorized for detection and/or diagnosis of SARS-CoV-2 by FDA under an Emergency Use Authorization (EUA).  This EUA will remain in effect (meaning this test can be used) for the duration of the COVID-19 declaration under Section 564(b)(1) of the Act, 21 U.S.C. section 360bbb-3(b)(1), unless the authorization is terminated or revoked sooner. Performed at [facility], [address] [place], [place] [address]   Respiratory Panel by PCR     Status: None   Collection Time: 11/26/18 11:59 PM   Specimen: Urine, Random; Respiratory  Result Value Ref Range Status   Adenovirus NOT DETECTED NOT DETECTED Final   Coronavirus 229E NOT DETECTED NOT DETECTED Final    Comment: (NOTE) The Coronavirus on the Respiratory Panel, DOES NOT test for the novel  Coronavirus (2019 nCoV)    Coronavirus HKU1 NOT DETECTED NOT DETECTED Final   Coronavirus NL63 NOT DETECTED NOT DETECTED  Final   Coronavirus OC43 NOT DETECTED NOT DETECTED Final   Metapneumovirus NOT DETECTED NOT DETECTED Final   Rhinovirus / Enterovirus NOT DETECTED NOT DETECTED Final   Influenza A NOT DETECTED NOT DETECTED Final   Influenza B NOT DETECTED NOT DETECTED Final   Parainfluenza Virus 1 NOT DETECTED NOT DETECTED Final   Parainfluenza Virus 2 NOT DETECTED NOT DETECTED Final   Parainfluenza Virus 3 NOT DETECTED NOT DETECTED Final   Parainfluenza Virus 4 NOT DETECTED NOT DETECTED Final   Respiratory Syncytial Virus NOT DETECTED NOT DETECTED Final   Bordetella pertussis NOT DETECTED NOT DETECTED Final   Chlamydophila pneumoniae NOT DETECTED NOT DETECTED Final   Mycoplasma pneumoniae NOT DETECTED NOT DETECTED Final    Comment: Performed at [facility], [place] [address] [place], [place] [address]         Radiology Studies: Dg Chest [place] 1 View  Result Date: 12/01/2018 CLINICAL DATA:  Former smoker, admission 11-27-18 due to acute exacerbation of 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 Aeration of the bilateral lungs appears mildly improved. There are coarse bilateral interstitial opacities likely representing underlying pulmonary fibrosis. No pneumothorax or large pleural effusion. IMPRESSION: 1. Mildly improved aeration of the bilateral lungs which may represent resolving acute infection or inflammation. 2. Bilateral interstitial opacities most likely representing underlying pulmonary fibrosis. Electronically Signed   By: Audie Pinto M.D.   On: 12/01/2018 10:46        Scheduled Meds: . aspirin EC  81 mg Oral Daily  . brimonidine  1 drop Both Eyes BID  . clopidogrel  75 mg  Oral Daily  . docusate sodium  100 mg Oral BID  . dorzolamide  1 drop Both Eyes BID  . enoxaparin (LOVENOX) injection  40 mg Subcutaneous Q24H  .  hydrochlorothiazide  25 mg Oral Daily  . insulin aspart  0-9 Units Subcutaneous TID WC  . insulin aspart  15 Units Subcutaneous TID WC  . insulin glargine  12 Units Subcutaneous QHS  . ipratropium-albuterol  3 mL Nebulization TID  . latanoprost  1 drop Both Eyes QHS  . lisinopril  40 mg Oral Daily  . magnesium oxide  400 mg Oral Daily  . methylPREDNISolone (SOLU-MEDROL) injection  40 mg Intravenous Q12H  . multivitamin with minerals  1 tablet Oral Daily  . niacin  500 mg Oral QHS  . Ensure Max Protein  11 oz Oral BID BM  . sodium chloride flush  3 mL Intravenous Q12H  . timolol  1 drop Both Eyes BID   Continuous Infusions:   LOS: 7 days    Time spent: 25-30 min    Ezekiel Slocumb, DO [facility] Pager: [phone]  If 7PM-7AM, please contact night-coverage [email or web address] Password [facility] 12/02/2018, 7:17 AM

## 2018-12-02 NOTE — Progress Notes (Signed)
Oxygen qualification note  SaO2 on room air at rest = 81% SaO2 on 4L at rest = 96% SaO2 on room air while ambulating = not safe to attempt SaO2 on 4 liters of O2 while ambulating = 88% SaO2 on 5 liters of O2 while ambulating = 93%  Pt desaturates on room air with mobility and requires supplemental oxygen to maintain a safe saturation level.  11:01 AM, 12/02/18 Etta Grandchild, PT, DPT Physical Therapist - [facility]  [phone] [facility])

## 2018-12-02 NOTE — Progress Notes (Signed)
Physical Therapy Treatment Patient Details Name: Gerald Clark MRN: [id number] DOB: [birth year] Today's Date: 12/02/2018    History of Present Illness Gerald Clark ""Gerald" Clark is a 61yoM who presented to ER secondary to worsening SOB; admitted for management of acute hypoxic respiratory failure due to CAP.    PT Comments     Pt in bed upon arrival, agreeable to participate. Pt on 4L via HFNC. Attempted resting RA with noted desaturation of 81% pt has no SOB. Pt AMB to door and back, slowly, but steady with RW and needs some cues for safety with RW. On 4L and HFNC pt has desaturation to mid 80s% and HR 134bpm. Pt reported difficulty with nasal breathing. After 5 minutes recovery, pt agreeable to repeat AMB bout, this time with nonrebreather and 5L. SpO2 drops to 93%, but HR in 120s. Pt reports subjectively feeling better with 2nd bout. Pt left up in chair at end of session.       Follow Up Recommendations  Home health PT     Equipment Recommendations  Rolling walker with 5" wheels    Recommendations for Other Services       Precautions / Restrictions Precautions Precautions: Fall Precaution Comments: Moderate Fall    Mobility  Bed Mobility Overal bed mobility: Modified Independent                Transfers Overall transfer level: Needs assistance Equipment used: Rolling walker (2 wheeled) Transfers: Sit to/from Stand Sit to Stand: Min assist;Supervision         General transfer comment: minA first time, then supervision 2nd time. Max effort required.  Ambulation/Gait Ambulation/Gait assistance: Min guard Gait Distance (Feet): 38 Feet(22ftx2, 5 minute rest between) Assistive device: Rolling walker (2 wheeled)       General Gait Details: VC to maintain RW at a close distance, and to not sit prematurely.   Stairs             Wheelchair Mobility    Modified Rankin (Stroke Patients Only)       Balance Overall balance assessment: Modified  Independent;Mild deficits observed, not formally tested Sitting-balance support: No upper extremity supported;Feet supported Sitting balance-Leahy Scale: Good     Standing balance support: Bilateral upper extremity supported;During functional activity Standing balance-Leahy Scale: Fair                              Cognition Arousal/Alertness: Awake/alert Behavior During Therapy: WFL for tasks assessed/performed Overall Cognitive Status: Within Functional Limits for tasks assessed                                        Exercises      General Comments        Pertinent Vitals/Pain Pain Assessment: Faces Faces Pain Scale: Hurts a little bit Pain Location: epigastric pain, pt believes to be related to hiatal hernia Pain Descriptors / Indicators: Sore;Aching Pain Intervention(s): Limited activity within patient's tolerance;Monitored during session    Home Living                      Prior Function            PT Goals (current goals can now be found in the care plan section) Acute Rehab PT Goals Patient Stated Goal: to return home and to go fishing PT Goal  Formulation: With patient Time For Goal Achievement: 12/14/18 Potential to Achieve Goals: Good Progress towards PT goals: Progressing toward goals    Frequency    Min 2X/week      PT Plan Current plan remains appropriate    Co-evaluation              AM-PAC PT "6 Clicks" Mobility   Outcome Measure  Help needed turning from your back to your side while in a flat bed without using bedrails?: None Help needed moving from lying on your back to sitting on the side of a flat bed without using bedrails?: None Help needed moving to and from a bed to a chair (including a wheelchair)?: A Little Help needed standing up from a chair using your arms (e.g., wheelchair or bedside chair)?: A Little Help needed to walk in hospital room?: A Little Help needed climbing 3-5 steps with  a railing? : A Little 6 Click Score: 20    End of Session Equipment Utilized During Treatment: Oxygen Activity Tolerance: Patient tolerated treatment well Patient left: in chair;with call bell/phone within reach Nurse Communication: Mobility status PT Visit Diagnosis: Muscle weakness (generalized) (M62.81);Difficulty in walking, not elsewhere classified (R26.2)     Time: [id number] PT Time Calculation (min) (ACUTE ONLY): 26 min  Charges:  $Therapeutic Exercise: 23-37 mins                     11:30 AM, 12/02/18 Etta Grandchild, PT, DPT Physical Therapist - [facility]  [phone] ([place])    Anvika Gashi C 12/02/2018, 11:09 AM

## 2018-12-02 NOTE — Progress Notes (Signed)
Pt has graduated to O2 @2l /[place] with pulse ox stable. No BM since 11/2 with senna, MOM given and lactulose added-no results thus far with 2nd dose of lactulose given. Pt is passing gas. Up in chair x 2 and tolerated well.

## 2018-12-03 DIAGNOSIS — I739 Peripheral vascular disease, unspecified: Secondary | ICD-10-CM

## 2018-12-03 LAB — BASIC METABOLIC PANEL WITH GFR
Anion gap: 8 (ref 5–15)
BUN: 72 mg/dL — ABNORMAL HIGH (ref 8–23)
CO2: 27 mmol/L (ref 22–32)
Calcium: 8.7 mg/dL — ABNORMAL LOW (ref 8.9–10.3)
Chloride: 89 mmol/L — ABNORMAL LOW (ref 98–111)
Creatinine, Ser: 0.81 mg/dL (ref 0.61–1.24)
GFR calc Af Amer: >60 mL/min
GFR calc non Af Amer: >60 mL/min
Glucose, Bld: 127 mg/dL — ABNORMAL HIGH (ref 70–99)
Potassium: 4.5 mmol/L (ref 3.5–5.1)
Sodium: 124 mmol/L — ABNORMAL LOW (ref 135–145)

## 2018-12-03 LAB — GLUCOSE, CAPILLARY
Glucose-Capillary: 321 mg/dL — ABNORMAL HIGH (ref 70–99)
Glucose-Capillary: 168 mg/dL — ABNORMAL HIGH (ref 70–99)
Glucose-Capillary: 103 mg/dL — ABNORMAL HIGH (ref 70–99)
Glucose-Capillary: 154 mg/dL — ABNORMAL HIGH (ref 70–99)
Glucose-Capillary: 61 mg/dL — ABNORMAL LOW (ref 70–99)

## 2018-12-03 LAB — MAGNESIUM: Magnesium: 3.2 mg/dL — ABNORMAL HIGH (ref 1.7–2.4)

## 2018-12-03 LAB — CBC WITH DIFFERENTIAL/PLATELET
Abs Immature Granulocytes: 0.41 10*3/uL — ABNORMAL HIGH (ref 0.00–0.07)
Basophils Absolute: 0.1 10*3/uL (ref 0.0–0.1)
Basophils Relative: 0 %
Eosinophils Absolute: 0 10*3/uL (ref 0.0–0.5)
Eosinophils Relative: 0 %
HCT: 38.6 % — ABNORMAL LOW (ref 39.0–52.0)
Hemoglobin: 13.3 g/dL (ref 13.0–17.0)
Immature Granulocytes: 2 %
Lymphocytes Relative: 10 %
Lymphs Abs: 2.7 10*3/uL (ref 0.7–4.0)
MCH: 27.6 pg (ref 26.0–34.0)
MCHC: 34.5 g/dL (ref 30.0–36.0)
MCV: 80.1 fL (ref 80.0–100.0)
Monocytes Absolute: 2.6 10*3/uL — ABNORMAL HIGH (ref 0.1–1.0)
Monocytes Relative: 9 %
Neutro Abs: 21.5 10*3/uL — ABNORMAL HIGH (ref 1.7–7.7)
Neutrophils Relative %: 79 %
Platelets: 467 10*3/uL — ABNORMAL HIGH (ref 150–400)
RBC: 4.82 MIL/uL (ref 4.22–5.81)
RDW: 14 % (ref 11.5–15.5)
Smear Review: NORMAL
WBC: 27.2 10*3/uL — ABNORMAL HIGH (ref 4.0–10.5)
nRBC: 0 % (ref 0.0–0.2)

## 2018-12-03 LAB — BASIC METABOLIC PANEL
Anion gap: 9 (ref 5–15)
BUN: 95 mg/dL — ABNORMAL HIGH (ref 8–23)
CO2: 30 mmol/L (ref 22–32)
Calcium: 9.6 mg/dL (ref 8.9–10.3)
Chloride: 86 mmol/L — ABNORMAL LOW (ref 98–111)
Creatinine, Ser: 1.21 mg/dL (ref 0.61–1.24)
GFR calc Af Amer: >60 mL/min (ref >60)
GFR calc non Af Amer: 59 mL/min — ABNORMAL LOW (ref >60)
Glucose, Bld: 128 mg/dL — ABNORMAL HIGH (ref 70–99)
Potassium: 4.7 mmol/L (ref 3.5–5.1)
Sodium: 125 mmol/L — ABNORMAL LOW (ref 135–145)

## 2018-12-03 MED ORDER — ENSURE MAX PROTEIN PO LIQD: 11.0000 [oz_av] | Freq: Two times a day (BID) | ORAL | Status: DC

## 2018-12-03 MED ORDER — MIRTAZAPINE 15 MG PO TABS
15.0000 mg | ORAL_TABLET | Freq: Every day | ORAL | Status: DC
  Administered 2018-12-03: 15 mg via ORAL
  Filled 2018-12-03: qty 1

## 2018-12-03 MED ORDER — BIOTENE DRY MOUTH MT LIQD: 15.0000 mL | OROMUCOSAL | Status: DC | PRN

## 2018-12-03 MED ORDER — PREDNISONE 50 MG PO TABS: 50.0000 mg | ORAL_TABLET | Freq: Every day | ORAL | 0 refills | Status: DC

## 2018-12-03 MED ORDER — SODIUM CHLORIDE 0.9 % IV SOLN
INTRAVENOUS | Status: DC
  Administered 2018-12-03 (×2): via INTRAVENOUS

## 2018-12-03 MED ORDER — PANTOPRAZOLE SODIUM 40 MG PO TBEC: 40.0000 mg | DELAYED_RELEASE_TABLET | Freq: Every day | ORAL | 1 refills | Status: DC

## 2018-12-03 MED ORDER — ADULT MULTIVITAMIN W/MINERALS CH: 1.0000 | ORAL_TABLET | Freq: Every day | ORAL | Status: AC

## 2018-12-03 MED ORDER — ONDANSETRON HCL 4 MG PO TABS: 4.0000 mg | ORAL_TABLET | Freq: Four times a day (QID) | ORAL | 0 refills | Status: DC | PRN

## 2018-12-03 MED ORDER — INSULIN GLARGINE 100 UNIT/ML ~~LOC~~ SOLN: 20.0000 [IU] | Freq: Every day | SUBCUTANEOUS | 11 refills | Status: DC

## 2018-12-03 MED ORDER — INSULIN ASPART 100 UNIT/ML ~~LOC~~ SOLN: 0.0000 [IU] | Freq: Three times a day (TID) | SUBCUTANEOUS | 11 refills | Status: DC

## 2018-12-03 MED ORDER — INSULIN ASPART 100 UNIT/ML ~~LOC~~ SOLN: 15.0000 [IU] | Freq: Three times a day (TID) | SUBCUTANEOUS | 11 refills | Status: DC

## 2018-12-03 MED ORDER — DOCUSATE SODIUM 100 MG PO CAPS: 100.0000 mg | ORAL_CAPSULE | Freq: Two times a day (BID) | ORAL | 0 refills | Status: DC

## 2018-12-03 MED ORDER — GLUCERNA SHAKE PO LIQD
237.0000 mL | Freq: Three times a day (TID) | ORAL | Status: DC
  Administered 2018-12-03 – 2018-12-04 (×2): 237 mL via ORAL

## 2018-12-03 MED ORDER — INSULIN STARTER KIT- SYRINGES (ENGLISH)
1.0000 | Freq: Once | Status: AC
  Administered 2018-12-04: 1
  Filled 2018-12-03: qty 1

## 2018-12-03 MED ORDER — SODIUM CHLORIDE 0.9 % IV SOLN: INTRAVENOUS | Status: DC

## 2018-12-03 MED ORDER — MIRTAZAPINE 15 MG PO TABS: 15.0000 mg | ORAL_TABLET | Freq: Every day | ORAL | 1 refills | Status: AC

## 2018-12-03 NOTE — TOC Progression Note (Signed)
Transition of Care [facility]) - Progression Note    Patient Details  Name: Gerald Clark MRN: [id number] Date of Birth: January 11, 1946  [facility] CM/SW Contact  Shelbie Hutching, RN Phone Number: 12/03/2018, 3:30 PM  Clinical Narrative:    Patient will need home O2 at discharge, MD has placed orders for home O2 2L.  Patient lives in [place] with his wife.  Patient reports that at baseline he is independent and very active.   Patient hopes to return to his previous level of function but at this time would benefit from home health services.  Home Health referral given to Drue Novel with [facility].  RNCM spoke with Helene Kelp via phone, Helene Kelp accepted referral for RN, and PT.   Plan is for patient to discharge today.  Patient will need a rolling walker,  [facility] will provide RW, and home oxygen, both delivered to the bedside by Georgina Snell.    Patient's wife will provide transportation home at discharge.  MD was waiting on repeat labs before placing discharge orders.    Expected Discharge Plan: [place] Barriers to Discharge: No Barriers Identified, Barriers Resolved  Expected Discharge Plan and Services Expected Discharge Plan: [place]   Discharge Planning Services: CM Consult Post Acute Care Choice: [place] arrangements for the past 2 months: Single Family Home                 DME Arranged: Walker rolling, Oxygen DME Agency: [facility] Date DME Agency Contacted: 12/03/18 Time DME Agency Contacted: 1527 Representative spoke with at DME Agency: Georgina Snell [place]: RN, PT [facility] Agency: [facility] (formerly [employer]) Date [place]: 12/03/18 Time [place]: 1528 Representative spoke with at [place]: [place] (SDOH) Interventions    Readmission Risk Interventions No flowsheet data found.

## 2018-12-03 NOTE — Progress Notes (Signed)
Pulmonary Medicine          Date: 12/03/2018,   MRN# [id number] Gerald Clark 06/18/1945     AdmissionWeight: 72.6 kg                 CurrentWeight: 69.4 kg(taken with bed scale)      CHIEF COMPLAINT:   Acute hypoxemic respiratory failure   SUBJECTIVE    Patient sitting up with wife at bedside.  He is down to 2L/min NS.  He had vasovagal episode last night during BM. States he felt better after emptying bowels.  He is asking to go home which may be okay if Primary team is comfortable. I discussed with both wife andpatient that he will need home O2 and must be careful with exertion until full recovery which may take >1 month.  We have discussed follow up in clinic next week.   PAST MEDICAL HISTORY   Past Medical History:  Diagnosis Date   Arthritis    Asthma    childhood asthma   Cancer ([place])    Basal Cell Skin Cancer   Diabetes mellitus without complication (HCC)    GERD (gastroesophageal reflux disease)    Glaucoma    History of chicken pox    Hyperlipemia    Hypertension    Peripheral vascular disease ([place])      SURGICAL HISTORY   Past Surgical History:  Procedure Laterality Date   COLONOSCOPY N/A 06/02/2014   Procedure: COLONOSCOPY;  Surgeon: Josefine Class, MD;  Location: [facility] ENDOSCOPY;  Service: Endoscopy;  Laterality: N/A;   INGUINAL HERNIA REPAIR Left 06/28/2016   Procedure: HERNIA REPAIR INGUINAL ADULT WITH MESH;  Surgeon: Leonie Green, MD;  Location: [facility] ORS;  Service: General;  Laterality: Left;   LOWER EXTREMITY ANGIOGRAPHY Right 10/24/2016   Procedure: Lower Extremity Angiography;  Surgeon: Algernon Huxley, MD;  Location: [place] CV LAB;  Service: Cardiovascular;  Laterality: Right;     FAMILY HISTORY   Family History  Problem Relation Age of Onset   Hypertension Mother    Hypertension Father    Cancer Father    Breast cancer Sister    Cancer Sister      SOCIAL HISTORY   Social History    Tobacco Use   Smoking status: Former Smoker    Packs/day: 2.00    Types: Cigarettes    Quit date: 04/25/2011    Years since quitting: 7.6   Smokeless tobacco: Never Used  Substance Use Topics   Alcohol use: Yes    Alcohol/week: 5.0 standard drinks    Types: 5 Cans of beer per week    Comment: occas   Drug use: No     MEDICATIONS    Home Medication:    Current Medication:  Current Facility-Administered Medications:    0.9 %  sodium chloride infusion, , Intravenous, Continuous, [facility] A, DO, Last Rate: 100 mL/hr at 12/03/18 0842   acetaminophen (TYLENOL) tablet 650 mg, 650 mg, Oral, Q6H PRN, 650 mg at 12/02/18 2142 **OR** acetaminophen (TYLENOL) suppository 650 mg, 650 mg, Rectal, Q6H PRN, Mansy, Jan A, MD   aspirin EC tablet 81 mg, 81 mg, Oral, Daily, Mansy, Jan A, MD, 81 mg at 12/03/18 0819   brimonidine (ALPHAGAN) 0.2 % ophthalmic solution 1 drop, 1 drop, Both Eyes, BID, Mansy, Jan A, MD, 1 drop at 12/03/18 [id number]   clopidogrel (PLAVIX) tablet 75 mg, 75 mg, Oral, Daily, Mansy, Jan A, MD, 75 mg at 12/03/18 [phone]  docusate sodium (COLACE) capsule 100 mg, 100 mg, Oral, BID, [facility] A, DO, 100 mg at 12/03/18 0819   dorzolamide (TRUSOPT) 2 % ophthalmic solution 1 drop, 1 drop, Both Eyes, BID, Mansy, Jan A, MD, 1 drop at 12/03/18 0824   enoxaparin (LOVENOX) injection 40 mg, 40 mg, Subcutaneous, Q24H, Jennye Boroughs, MD, 40 mg at 12/02/18 2126   insulin aspart (novoLOG) injection 0-15 Units, 0-15 Units, Subcutaneous, TID WC, [facility] A, DO, 3 Units at 12/03/18 0834   insulin aspart (novoLOG) injection 15 Units, 15 Units, Subcutaneous, TID WC, [facility] A, DO, 15 Units at 12/03/18 0835   insulin glargine (LANTUS) injection 20 Units, 20 Units, Subcutaneous, QHS, Ezekiel Slocumb, DO, 20 Units at 12/02/18 2126   ipratropium-albuterol (DUONEB) 0.5-2.5 (3) MG/3ML nebulizer solution 3 mL, 3 mL, Nebulization, TID, [facility] A, DO, 3 mL at  12/03/18 0745   lactulose (CHRONULAC) 10 GM/15ML solution 20 g, 20 g, Oral, BID PRN, [facility] A, DO, 20 g at 12/02/18 1757   latanoprost (XALATAN) 0.005 % ophthalmic solution 1 drop, 1 drop, Both Eyes, QHS, Mansy, Jan A, MD, 1 drop at 12/02/18 2127   magnesium hydroxide (MILK OF MAGNESIA) suspension 30 mL, 30 mL, Oral, Daily PRN, Mansy, Jan A, MD, 30 mL at 12/02/18 0816   magnesium oxide (MAG-OX) tablet 400 mg, 400 mg, Oral, Daily, Mansy, Jan A, MD, 400 mg at 12/03/18 0820   multivitamin with minerals tablet 1 tablet, 1 tablet, Oral, Daily, Jennye Boroughs, MD, 1 tablet at 12/03/18 0820   niacin (SLO-NIACIN) CR tablet 500 mg, 500 mg, Oral, QHS, Mansy, Jan A, MD, 500 mg at 12/02/18 2127   ondansetron (ZOFRAN) tablet 4 mg, 4 mg, Oral, Q6H PRN **OR** ondansetron (ZOFRAN) injection 4 mg, 4 mg, Intravenous, Q6H PRN, Mansy, Jan A, MD, 4 mg at 12/02/18 0443   pantoprazole (PROTONIX) EC tablet 40 mg, 40 mg, Oral, Daily, [facility] A, DO, 40 mg at 12/03/18 0820   polyvinyl alcohol (LIQUIFILM TEARS) 1.4 % ophthalmic solution 1 drop, 1 drop, Both Eyes, TID PRN, Mansy, Jan A, MD   predniSONE (DELTASONE) tablet 50 mg, 50 mg, Oral, Q breakfast, Qianna Clagett, MD, 50 mg at 12/03/18 0820   protein supplement (ENSURE MAX) liquid, 11 oz, Oral, BID BM, Jennye Boroughs, MD, 11 oz at 12/03/18 0848   senna (SENOKOT) tablet 8.6 mg, 1 tablet, Oral, Daily PRN, [facility] A, DO, 8.6 mg at 12/02/18 0815   sodium chloride flush (NS) 0.9 % injection 3 mL, 3 mL, Intravenous, Q12H, Arbutus Ped, Kelly A, DO, 3 mL at 12/02/18 2127   sodium chloride flush (NS) 0.9 % injection 3 mL, 3 mL, Intravenous, PRN, [facility] A, DO, 3 mL at 12/01/18 1710   timolol (TIMOPTIC) 0.5 % ophthalmic solution 1 drop, 1 drop, Both Eyes, BID, Mansy, Jan A, MD, 1 drop at 12/03/18 [id number]   traZODone (DESYREL) tablet 25 mg, 25 mg, Oral, QHS PRN, Mansy, Jan A, MD, 25 mg at 12/01/18 2140    ALLERGIES   Patient has no  known allergies.     REVIEW OF SYSTEMS    Review of Systems:  Gen:  Denies  fever, sweats, chills weigh loss  HEENT: Denies blurred vision, double vision, ear pain, eye pain, hearing loss, nose bleeds, sore throat Cardiac:  No dizziness, chest pain or heaviness, chest tightness,edema Resp:   Denies cough or sputum porduction, shortness of breath,wheezing, hemoptysis,  Gi: Denies swallowing difficulty, stomach pain, nausea or vomiting, diarrhea, constipation,  bowel incontinence Gu:  Denies bladder incontinence, burning urine Ext:   Denies Joint pain, stiffness or swelling Skin: Denies  skin rash, easy bruising or bleeding or hives Endoc:  Denies polyuria, polydipsia , polyphagia or weight change Psych:   Denies depression, insomnia or hallucinations   Other:  All other systems negative   VS: BP (!) 113/56 (BP Location: Left Arm)    Pulse 90    Temp 98.4 F (36.9 C)    Resp 16    Ht 5\' 6"  (1.676 m)    Wt 69.4 kg Comment: taken with bed scale   SpO2 94%    BMI 24.69 kg/m      PHYSICAL EXAM    GENERAL:NAD, no fevers, chills, no weakness no fatigue HEAD: Normocephalic, atraumatic.  EYES: Pupils equal, round, reactive to light. Extraocular muscles intact. No scleral icterus.  MOUTH: Moist mucosal membrane. Dentition intact. No abscess noted.  EAR, NOSE, THROAT: Clear without exudates. No external lesions.  NECK: Supple. No thyromegaly. No nodules. No JVD.  PULMONARY: bilateral velcor crepitations worse at bases improved from previous.  CARDIOVASCULAR: S1 and S2. Regular rate and rhythm. No murmurs, rubs, or gallops. No edema. Pedal pulses 2+ bilaterally.  GASTROINTESTINAL: Soft, nontender, nondistended. No masses. Positive bowel sounds. No hepatosplenomegaly.  MUSCULOSKELETAL: No swelling, clubbing, or edema. Range of motion full in all extremities.  NEUROLOGIC: Cranial nerves II through XII are intact. No gross focal neurological deficits. Sensation intact. Reflexes intact.    SKIN: No ulceration, lesions, rashes, or cyanosis. Skin warm and dry. Turgor intact.  PSYCHIATRIC: Mood, affect within normal limits. The patient is awake, alert and oriented x 3. Insight, judgment intact.       IMAGING    Dg Chest 1 View  Result Date: 12/02/2018 CLINICAL DATA:  Shortness of breath EXAM: CHEST  1 VIEW COMPARISON:  Radiograph 12/01/2018, CT 11/25/2018 FINDINGS: Coarse interstitial changes throughout the lungs compatible with known history of pulmonary fibrosis. Lung volumes are diminished with increasing hazy opacity which could reflect atelectasis or underlying infection. Cardiomegaly is similar to comparison is. Atherosclerotic calcification of the aorta is noted. Degenerative changes are present in the imaged spine and shoulders. IMPRESSION: 1. Coarse interstitial changes throughout the lungs compatible with known history of pulmonary interstitial disease. 2. Diminishing lung volumes with increasing hazy opacity which could reflect atelectasis or airspace disease. Electronically Signed   By: Lovena Le M.D.   On: 12/02/2018 22:32   Ct Angio Chest Pe W And/or Wo Contrast  Result Date: 11/25/2018 CLINICAL DATA:  Shortness of breath beginning 4 weeks ago, gradually worsening. EXAM: CT ANGIOGRAPHY CHEST WITH CONTRAST TECHNIQUE: Multidetector CT imaging of the chest was performed using the standard protocol during bolus administration of intravenous contrast. Multiplanar CT image reconstructions and MIPs were obtained to evaluate the vascular anatomy. CONTRAST:  26mL OMNIPAQUE IOHEXOL 350 MG/ML SOLN COMPARISON:  Current chest radiograph. FINDINGS: Cardiovascular: There is satisfactory opacification of the pulmonary arteries to the segmental level. There is no evidence of a pulmonary embolism. Heart is normal in size. Three-vessel coronary artery calcifications. No pericardial effusion. Great vessels normal in caliber. No aortic dissection. Aortic atherosclerosis. Mediastinum/Nodes: 9  mm hypoattenuating inferior right thyroid lobe nodule. No neck base or axillary masses or enlarged lymph nodes. Mediastinal adenopathy. Several reference measurements are made. 17 mm short axis prevascular node. Azygos level right paratracheal node measuring 17 mm in short axis. More superior right paratracheal node measuring 14 mm in short axis subcarinal node measuring 2.1 cm in  short axis. Mildly enlarged hilar nodes, 1.6 cm short axis superior right hilar node and a 1.5 cm mid left hilar and 1.8 cm left infrahilar nodes. Trachea and esophagus are unremarkable.  Small hiatal hernia. Lungs/Pleura: Lungs demonstrate coarse heterogeneous interstitial thickening, areas of interspersed cystic change, architectural distortion and intervening areas of ground-glass opacity, the latter most prominent in the lower lobes. Cystic change appears to be underlying paraseptal and mild centrilobular emphysema. Minimal bronchiectasis. No lung mass or suspicious nodule. No pleural effusion or pneumothorax. Upper Abdomen: No acute findings. Prominent gastrohepatic ligament lymph nodes, largest 1 point 2 cm in short axis. Musculoskeletal: No fracture or acute finding. No osteoblastic or osteolytic lesions. Review of the MIP images confirms the above findings. IMPRESSION: 1. No evidence of a pulmonary embolism. 2. Extensive findings of interstitial lung disease include a prominent component of ground-glass opacity, the latter most evident in the lower lobes. There is associated prominent mediastinal and less prominent hilar adenopathy. Mild underlying emphysema. 3. Chronic findings include three-vessel coronary artery calcifications and aortic atherosclerosis. Aortic Atherosclerosis (ICD10-I70.0) and Emphysema (ICD10-J43.9). Electronically Signed   By: Lajean Manes M.D.   On: 11/25/2018 18:24   Dg Chest Port 1 View  Result Date: 12/01/2018 CLINICAL DATA:  Former smoker, admission 11-27-18 due to acute exacerbation of pulmonary  fibrosis complicated by Asthma and COPD overlap syndrome EXAM: PORTABLE CHEST 1 VIEW COMPARISON:  Chest radiograph 11/27/2018, CT chest 11/25/2018 FINDINGS: Stable cardiomediastinal contours. Aeration of the bilateral lungs appears mildly improved. There are coarse bilateral interstitial opacities likely representing underlying pulmonary fibrosis. No pneumothorax or large pleural effusion. IMPRESSION: 1. Mildly improved aeration of the bilateral lungs which may represent resolving acute infection or inflammation. 2. Bilateral interstitial opacities most likely representing underlying pulmonary fibrosis. Electronically Signed   By: Audie Pinto M.D.   On: 12/01/2018 10:46   Dg Chest Port 1 View  Result Date: 11/27/2018 CLINICAL DATA:  Acute respiratory failure EXAM: PORTABLE CHEST 1 VIEW COMPARISON:  11/25/2018, CT 11/25/2018 FINDINGS: Bilateral reticular and ground-glass opacity, consistent with chronic interstitial lung disease and fibrosis. Emphysematous disease. Ground-glass opacity at the bases without significant change. Stable cardiomediastinal silhouette with aortic atherosclerosis. No pneumothorax IMPRESSION: No significant interval change since 11/25/2018. Extensive reticular and ground-glass opacity corresponding to interstitial lung disease and fibrosis on prior CT. There may be acute superimposed infection or inflammatory change at the lung bases. Similar appearance of cardiomegaly Electronically Signed   By: Donavan Foil M.D.   On: 11/27/2018 17:26   Dg Chest Port 1 View  Result Date: 11/25/2018 CLINICAL DATA:  Shortness of breath starting 4 weeks ago, gradually worsening. Former smoker. EXAM: PORTABLE CHEST 1 VIEW COMPARISON:  None. FINDINGS: Borderline cardiomegaly. Patchy bilateral airspace opacities, predominantly peripheral. No pleural effusion or pneumothorax seen. Osseous structures about the chest are unremarkable. IMPRESSION: 1. Borderline cardiomegaly. 2. Bilateral airspace  opacities, of uncertain chronicity. Findings could represent chronic interstitial lung disease/fibrosis, acute interstitial edema or multifocal pneumonia. In the absence of fever, suspect some degree of acute edema superimposed on chronic interstitial lung disease/fibrosis. Electronically Signed   By: Franki Cabot M.D.   On: 11/25/2018 16:20              ASSESSMENT/PLAN   Acute hypoxemic respiratory faiure  -due to acute exacerbation of pulmonary fibrosis/ILD complicated by Asthma and COPD overlap syndrome.   -had vasovalgal episode overnight  - Solumedrol - decreased to 40 BID today - plan for 50 Po prednisone daily, may d/c with prednisone  until we see patient in clinic next week   - patient is DNR/DNI   -Pulmicort neublizer therapy   - DuoNEB - caution as patient is  tachycardic   - empiric antibiotics  -finished full course for CAP - PO Levoquin 750 daily- Dcd today  - Procalictonin -mildly elevated - blood cultures X2 -negative to date -4d -Arterial blood gas-severe hypoxemia -continue [place] with weaning parameters to goal SpO2>88 -repeat CXR in am - interval improvement as evidenced by pictorial documentation above -s/p PT eval - BORG6 symptoms within 1 minute of walk.     Thank you for allowing me to participate in the care of this patient.    Patient/Family are satisfied with care plan and all questions have been answered.  This document was prepared using Dragon voice recognition software and may include unintentional dictation errors.     Ottie Glazier, M.D.  Division of [place]

## 2018-12-03 NOTE — Progress Notes (Signed)
Nutrition Follow-up  DOCUMENTATION CODES:   Non-severe (moderate) malnutrition in context of chronic illness  INTERVENTION:  Will discontinue Ensure Max Protein as patient does not like them.  Provide Glucerna Shake po TID, each supplement provides 220 kcal and 10 grams of protein.  Continue daily MVI.  NUTRITION DIAGNOSIS:   Moderate Malnutrition related to chronic illness(COPD) as evidenced by moderate muscle depletion, percent weight loss.  Ongoing.  GOAL:   Patient will meet greater than or equal to 90% of their needs  Progressing.  MONITOR:   PO intake, Supplement acceptance, Labs, Weight trends, I & O's  REASON FOR ASSESSMENT:   Malnutrition Screening Tool    ASSESSMENT:   73 year old pt with PMH of hypertension, hyperlipidemia and diabetes admitted with SOB. Found to have interstitial lung disease, possibly COPD.  Met with patient and wife at bedside. He reports his appetite remains decreased. He is eating 30-50% of most meals but occasionally can finish 100% of a meal. He does not like the Ensure Max Protein and is only drinking 1/2 bottle per day. He is willing to try the Glucerna instead to see if he likes it better.  Medications reviewed and include: Colace 100 mg BID, Novolog 0-15 units TID, Novolog 15 units TID, Lantus 20 units QHS, magnesium oxide 400 mg daily, Remeron 15 mg QHS, MVI daily, pantoprazole, prednisone 50 mg daily, NS at 100 mL/hr.  Labs reviewed: CBG 103-168, Sodium 124, Chloride 89, BUN 72.  Diet Order:   Diet Order            Diet heart healthy/carb modified Room service appropriate? Yes; Fluid consistency: Thin  Diet effective now             EDUCATION NEEDS:   Education needs have been addressed  Skin:  Skin Assessment: Skin Integrity Issues:(MSAD to sacrum)  Last BM:  12/03/2018 - medium type 3  Height:   Ht Readings from Last 1 Encounters:  11/25/18 5' 6"  (1.676 m)   Weight:   Wt Readings from Last 1 Encounters:   11/27/18 69.4 kg   Ideal Body Weight:  64.5 kg  BMI:  Body mass index is 24.69 kg/m.  Estimated Nutritional Needs:   Kcal:  1800-2100  Protein:  90-105 grams  Fluid:  1.8-2 L/day  Jacklynn Barnacle, MS, RD, LDN Office: [phone] Pager: [phone] After Hours/Weekend Pager: [phone]

## 2018-12-03 NOTE — Consult Note (Signed)
[facility]  CONSULT NOTE    Date: 12/03/2018                  Patient Name:  Gerald Clark  MRN: [id number]  DOB: 01-Jan-1946  Age / Sex: 73 y.o., male         PCP: Adin Hector, MD                 Service Requesting Consult: Dr. Arbutus Ped                 Reason for Consult: hyponatremia            History of Present Illness: Gerald Clark  Has been admitted to [facility] since 11/25/2018. Wife is at bedside. Prolonged hospitalization for acute respiratory failure. 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 patient is now feeling better with steroid taper, nebulizers and inhaled corticosteroids.   Patient was on hydrochlorothiazide as an outpatient. Outpatient serum sodium has ranged from 130-135. Patient also reports drinking plenty of fluids due to dry mouth and cough.   Medications: Outpatient medications: Medications Prior to Admission  Medication Sig Dispense Refill Last Dose  . aspirin 81 MG tablet Take 81 mg by mouth daily.   11/25/2018 at 0900  . brimonidine (ALPHAGAN) 0.2 % ophthalmic solution Place 1 drop into both eyes 2 (two) times daily.   11/25/2018 at 0900  . Cinnamon 500 MG capsule Take 500 mg by mouth daily.   11/25/2018 at 0900  . clopidogrel (PLAVIX) 75 MG tablet TAKE 1 TABLET BY MOUTH EVERY DAY 90 tablet 3 11/25/2018 at 0900  . dorzolamide (TRUSOPT) 2 % ophthalmic solution Place 1 drop into both eyes 2 (two) times daily.   11/25/2018 at 0900  . gabapentin (NEURONTIN) 100 MG capsule Take 100 mg by mouth every morning.   11/25/2018 at 0900  . glipiZIDE (GLUCOTROL) 10 MG tablet Take 10 mg by mouth 2 (two) times daily before a meal.    11/25/2018 at 0900  . hydrochlorothiazide (HYDRODIURIL) 25 MG tablet Take 25 mg by mouth daily.   11/25/2018 at 0900  . JARDIANCE 25 MG TABS tablet Take 25 mg by mouth daily.   11/25/2018 at 0900  . lisinopril (PRINIVIL,ZESTRIL) 40 MG tablet Take 40 mg by mouth daily.    11/25/2018 at 0900  .  Magnesium Oxide 250 MG TABS Take 250 mg by mouth daily.   11/25/2018 at 0900  . metFORMIN (GLUCOPHAGE) 1000 MG tablet Take 1,000 mg by mouth 2 (two) times daily with a meal.   11/25/2018 at 0900  . pioglitazone (ACTOS) 15 MG tablet TAKE 1 TABLET (15 MG TOTAL) BY MOUTH ONCE DAILY.  5 11/25/2018 at 0900  . Potassium 99 MG TABS Take 99 mg by mouth daily.   11/25/2018 at 0900  . timolol (TIMOPTIC) 0.5 % ophthalmic solution Place 1 drop into both eyes 2 (two) times daily.   11/25/2018 at 0900  . amLODipine (NORVASC) 5 MG tablet Take 5 mg by mouth daily.   Not Taking at Unknown time  . latanoprost (XALATAN) 0.005 % ophthalmic solution Place 1 drop into both eyes at bedtime.   11/24/2018 at 2100  . niacin 500 MG CR capsule Take 500 mg by mouth at bedtime.    11/24/2018 at 2100  . Polyethyl Glycol-Propyl Glycol (SYSTANE ULTRA OP) Apply 1 drop to eye 3 (three) times daily as needed (Dry eyes).   prn at prn  . simvastatin (ZOCOR) 40  MG tablet Take 20 mg by mouth daily at 6 PM. TAKES 0.5 TABLET   11/24/2018 at 2100    Current medications: Current Facility-Administered Medications  Medication Dose Route Frequency Provider Last Rate Last Dose  . acetaminophen (TYLENOL) tablet 650 mg  650 mg Oral Q6H PRN Mansy, Jan A, MD   650 mg at 12/02/18 2142   Or  . acetaminophen (TYLENOL) suppository 650 mg  650 mg Rectal Q6H PRN Mansy, Jan A, MD      . aspirin EC tablet 81 mg  81 mg Oral Daily Mansy, Jan A, MD   81 mg at 12/03/18 0819  . brimonidine (ALPHAGAN) 0.2 % ophthalmic solution 1 drop  1 drop Both Eyes BID Mansy, Jan A, MD   1 drop at 12/03/18 0823  . clopidogrel (PLAVIX) tablet 75 mg  75 mg Oral Daily Mansy, Jan A, MD   75 mg at 12/03/18 0820  . docusate sodium (COLACE) capsule 100 mg  100 mg Oral BID Nicole Kindred A, DO   100 mg at 12/03/18 0819  . dorzolamide (TRUSOPT) 2 % ophthalmic solution 1 drop  1 drop Both Eyes BID Mansy, Jan A, MD   1 drop at 12/03/18 0824  . enoxaparin (LOVENOX) injection 40 mg  40 mg  Subcutaneous Q24H Jennye Boroughs, MD   40 mg at 12/02/18 2126  . feeding supplement (GLUCERNA SHAKE) (GLUCERNA SHAKE) liquid 237 mL  237 mL Oral TID BM Nicole Kindred A, DO      . insulin aspart (novoLOG) injection 0-15 Units  0-15 Units Subcutaneous TID WC Nicole Kindred A, DO   3 Units at 12/03/18 0834  . insulin aspart (novoLOG) injection 15 Units  15 Units Subcutaneous TID WC Nicole Kindred A, DO   15 Units at 12/03/18 1210  . insulin glargine (LANTUS) injection 20 Units  20 Units Subcutaneous QHS Nicole Kindred A, DO   20 Units at 12/02/18 2126  . insulin starter kit- syringes (English) 1 kit  1 kit Other Once Nicole Kindred A, DO      . ipratropium-albuterol (DUONEB) 0.5-2.5 (3) MG/3ML nebulizer solution 3 mL  3 mL Nebulization TID Nicole Kindred A, DO   3 mL at 12/03/18 1400  . lactulose (CHRONULAC) 10 GM/15ML solution 20 g  20 g Oral BID PRN Nicole Kindred A, DO   20 g at 12/02/18 1757  . latanoprost (XALATAN) 0.005 % ophthalmic solution 1 drop  1 drop Both Eyes QHS Mansy, Jan A, MD   1 drop at 12/02/18 2127  . magnesium hydroxide (MILK OF MAGNESIA) suspension 30 mL  30 mL Oral Daily PRN Mansy, Jan A, MD   30 mL at 12/02/18 0816  . magnesium oxide (MAG-OX) tablet 400 mg  400 mg Oral Daily Mansy, Jan A, MD   400 mg at 12/03/18 0820  . mirtazapine (REMERON) tablet 15 mg  15 mg Oral QHS Nicole Kindred A, DO      . multivitamin with minerals tablet 1 tablet  1 tablet Oral Daily Jennye Boroughs, MD   1 tablet at 12/03/18 0820  . niacin (SLO-NIACIN) CR tablet 500 mg  500 mg Oral QHS Mansy, Jan A, MD   500 mg at 12/02/18 2127  . ondansetron (ZOFRAN) tablet 4 mg  4 mg Oral Q6H PRN Mansy, Jan A, MD       Or  . ondansetron [facility]) injection 4 mg  4 mg Intravenous Q6H PRN Mansy, Jan A, MD   4 mg at 12/02/18 0443  .  pantoprazole (PROTONIX) EC tablet 40 mg  40 mg Oral Daily Nicole Kindred A, DO   40 mg at 12/03/18 0820  . polyvinyl alcohol (LIQUIFILM TEARS) 1.4 % ophthalmic solution 1 drop  1  drop Both Eyes TID PRN Mansy, Jan A, MD      . predniSONE (DELTASONE) tablet 50 mg  50 mg Oral Q breakfast Ottie Glazier, MD   50 mg at 12/03/18 0820  . senna (SENOKOT) tablet 8.6 mg  1 tablet Oral Daily PRN Nicole Kindred A, DO   8.6 mg at 12/02/18 0815  . sodium chloride flush (NS) 0.9 % injection 3 mL  3 mL Intravenous Q12H Nicole Kindred A, DO   3 mL at 12/03/18 1000  . sodium chloride flush (NS) 0.9 % injection 3 mL  3 mL Intravenous PRN Nicole Kindred A, DO   3 mL at 12/01/18 1710  . timolol (TIMOPTIC) 0.5 % ophthalmic solution 1 drop  1 drop Both Eyes BID Mansy, Jan A, MD   1 drop at 12/03/18 0823  . traZODone (DESYREL) tablet 25 mg  25 mg Oral QHS PRN Mansy, Jan A, MD   25 mg at 12/01/18 2140      Allergies: No Known Allergies    Past Medical History: Past Medical History:  Diagnosis Date  . Arthritis   . Asthma    childhood asthma  . Cancer ([place])    Basal Cell Skin Cancer  . Diabetes mellitus without complication ([place])   . GERD (gastroesophageal reflux disease)   . Glaucoma   . History of chicken pox   . Hyperlipemia   . Hypertension   . Peripheral vascular disease [facility])      Past Surgical History: Past Surgical History:  Procedure Laterality Date  . COLONOSCOPY N/A 06/02/2014   Procedure: COLONOSCOPY;  Surgeon: Josefine Class, MD;  Location[facility] ENDOSCOPY;  Service: Endoscopy;  Laterality: N/A;  . INGUINAL HERNIA REPAIR Left 06/28/2016   Procedure: HERNIA REPAIR INGUINAL ADULT WITH MESH;  Surgeon: Leonie Green, MD;  Location: [facility] ORS;  Service: General;  Laterality: Left;  . LOWER EXTREMITY ANGIOGRAPHY Right 10/24/2016   Procedure: Lower Extremity Angiography;  Surgeon: Algernon Huxley, MD;  Location: [place] CV LAB;  Service: Cardiovascular;  Laterality: Right;     Family History: Family History  Problem Relation Age of Onset  . Hypertension Mother   . Hypertension Father   . Cancer Father   . Breast cancer Sister   . Cancer Sister       Social History: Social History   Socioeconomic History  . Marital status: Married    Spouse name: Not on file  . Number of children: Not on file  . Years of education: Not on file  . Highest education level: Not on file  Occupational History  . Not on file  Social Needs  . Financial resource strain: Not on file  . Food insecurity    Worry: Not on file    Inability: Not on file  . Transportation needs    Medical: Not on file    Non-medical: Not on file  Tobacco Use  . Smoking status: Former Smoker    Packs/day: 2.00    Types: Cigarettes    Quit date: 04/25/2011    Years since quitting: 7.6  . Smokeless tobacco: Never Used  Substance and Sexual Activity  . Alcohol use: Yes    Alcohol/week: 5.0 standard drinks    Types: 5 Cans of beer per week  Comment: occas  . Drug use: No  . Sexual activity: Not on file  Lifestyle  . Physical activity    Days per week: Not on file    Minutes per session: Not on file  . Stress: Not on file  Relationships  . Social Herbalist on phone: Not on file    Gets together: Not on file    Attends religious service: Not on file    Active member of club or organization: Not on file    Attends meetings of clubs or organizations: Not on file    Relationship status: Not on file  . Intimate partner violence    Fear of current or ex partner: Not on file    Emotionally abused: Not on file    Physically abused: Not on file    Forced sexual activity: Not on file  Other Topics Concern  . Not on file  Social History Narrative  . Not on file     Review of Systems: Review of Systems  Constitutional: Negative.  Negative for chills, diaphoresis, fever, malaise/fatigue and weight loss.  HENT: Negative.  Negative for congestion, ear discharge, ear pain, hearing loss, nosebleeds, sinus pain, sore throat and tinnitus.   Eyes: Negative.  Negative for blurred vision, double vision, photophobia, pain, discharge and redness.   Respiratory: Positive for cough, shortness of breath and wheezing. Negative for hemoptysis, sputum production and stridor.   Cardiovascular: Negative for chest pain, palpitations, orthopnea, claudication, leg swelling and PND.  Gastrointestinal: Positive for heartburn and nausea. Negative for abdominal pain, blood in stool, constipation, diarrhea, melena and vomiting.  Genitourinary: Negative.  Negative for dysuria, flank pain, frequency, hematuria and urgency.  Musculoskeletal: Negative.  Negative for back pain, falls, joint pain, myalgias and neck pain.  Skin: Negative.  Negative for itching and rash.  Neurological: Negative.  Negative for dizziness, tingling, tremors, sensory change, speech change, focal weakness, seizures, loss of consciousness, weakness and headaches.  Endo/Heme/Allergies: Negative.  Negative for environmental allergies and polydipsia. Does not bruise/bleed easily.  Psychiatric/Behavioral: Negative.  Negative for depression, hallucinations, memory loss, substance abuse and suicidal ideas. The patient is not nervous/anxious and does not have insomnia.     Vital Signs: Blood pressure (!) 113/56, pulse 90, temperature 98.4 F (36.9 C), resp. rate 16, height 5' 6" (1.676 m), weight 69.4 kg, SpO2 94 %.  Weight trends: Filed Weights   11/25/18 1517 11/27/18 1528  Weight: 72.6 kg 69.4 kg    Physical Exam: General: NAD,   Head: Normocephalic, atraumatic. Moist oral mucosal membranes  Eyes: Anicteric, PERRL  Neck: Supple, trachea midline  Lungs:  Clear to auscultation, 2l O2  Heart: Regular rate and rhythm  Abdomen:  Soft, nontender,   Extremities:  no peripheral edema.  Neurologic: Nonfocal, moving all four extremities  Skin: No lesions         Lab results: Basic Metabolic Panel: Recent Labs  Lab 11/28/18 1445  11/30/18 0431  12/02/18 0435 12/03/18 0439 12/03/18 1330  NA  --    < > 131*   < > 127* 125* 124*  K 3.6   < > 4.1   < > 5.0 4.7 4.5  CL  --    <  > 94*   < > 88* 86* 89*  CO2  --    < > 27   < > _0 GLUCOSE  --    < > 222*   < > 262* 128* 127*  BUN  --    < > 68*   < > 83* 95* 72*  CREATININE  --    < > 1.00   < > 1.02 1.21 0.81  CALCIUM  --    < > 9.3   < > 9.7 9.6 8.7*  MG 2.4  --  2.7*  --   --  3.2*  --    < > = values in this interval not displayed.    Liver Function Tests: No results for input(s): AST, ALT, ALKPHOS, BILITOT, PROT, ALBUMIN in the last 168 hours. No results for input(s): LIPASE, AMYLASE in the last 168 hours. No results for input(s): AMMONIA in the last 168 hours.  CBC: Recent Labs  Lab 11/29/18 0406 11/30/18 0431 12/01/18 0408 12/02/18 0435 12/03/18 0439  WBC 19.1* 19.6* 20.4* 28.2* 27.2*  NEUTROABS  --  15.5* 16.6* 22.3* 21.5*  HGB 14.0 14.5 15.2 14.9 13.3  HCT 43.4 45.1 46.1 44.5 38.6*  MCV 82.7 83.2 82.6 81.1 80.1  PLT 577* 537* 537* 569* 467*    Cardiac Enzymes: No results for input(s): CKTOTAL, CKMB, CKMBINDEX, TROPONINI in the last 168 hours.  BNP: Invalid input(s): POCBNP  CBG: Recent Labs  Lab 12/02/18 1748 12/02/18 2124 12/03/18 0735 12/03/18 1203 12/03/18 1632  GLUCAP 209* 140* 168* 103* 154*    Microbiology: Results for orders placed or performed during the hospital encounter of 11/25/18  SARS Coronavirus 2 by RT PCR (hospital order, performed in [facility] hospital lab) Nasopharyngeal Nasopharyngeal Swab     Status: None   Collection Time: 11/25/18  3:27 PM   Specimen: Nasopharyngeal Swab  Result Value Ref Range Status   SARS Coronavirus 2 NEGATIVE NEGATIVE Final    Comment: (NOTE) If result is NEGATIVE SARS-CoV-2 target nucleic acids are NOT DETECTED. The SARS-CoV-2 RNA is generally detectable in upper and lower  respiratory specimens during the acute phase of infection. The lowest  concentration of SARS-CoV-2 viral copies this assay can detect is 250  copies / mL. A negative result does not preclude SARS-CoV-2 infection  and should not be used as the sole  basis for treatment or other  patient management decisions.  A negative result may occur with  improper specimen collection / handling, submission of specimen other  than nasopharyngeal swab, presence of viral mutation(s) within the  areas targeted by this assay, and inadequate number of viral copies  (<250 copies / mL). A negative result must be combined with clinical  observations, patient history, and epidemiological information. If result is POSITIVE SARS-CoV-2 target nucleic acids are DETECTED. The SARS-CoV-2 RNA is generally detectable in upper and lower  respiratory specimens dur ing the acute phase of infection.  Positive  results are indicative of active infection with SARS-CoV-2.  Clinical  correlation with patient history and other diagnostic information is  necessary to determine patient infection status.  Positive results do  not rule out bacterial infection or co-infection with other viruses. If result is PRESUMPTIVE POSTIVE SARS-CoV-2 nucleic acids MAY BE PRESENT.   A presumptive positive result was obtained on the submitted specimen  and confirmed on repeat testing.  While 2019 novel coronavirus  (SARS-CoV-2) nucleic acids may be present in the submitted sample  additional confirmatory testing may be necessary for epidemiological  and / or clinical management purposes  to differentiate between  SARS-CoV-2 and other Sarbecovirus currently known to infect humans.  If clinically indicated additional testing with an alternate test  methodology [id number]) is advised. The SARS-CoV-2 RNA  is generally  detectable in upper and lower respiratory sp ecimens during the acute  phase of infection. The expected result is Negative. Fact Sheet for Patients:  StrictlyIdeas.no Fact Sheet for Healthcare Providers: BankingDealers.co.za This test is not yet approved or cleared by the [place] FDA and has been authorized for detection  and/or diagnosis of SARS-CoV-2 by FDA under an Emergency Use Authorization (EUA).  This EUA will remain in effect (meaning this test can be used) for the duration of the COVID-19 declaration under Section 564(b)(1) of the Act, 21 U.S.C. section 360bbb-3(b)(1), unless the authorization is terminated or revoked sooner. Performed at [facility], [address] [place], [place] [address]   Blood culture (routine x 2)     Status: None (Preliminary result)   Collection Time: 11/25/18  3:27 PM   Specimen: BLOOD  Result Value Ref Range Status   Specimen Description BLOOD LEFT ANTECUBITAL  Final   Special Requests   Final    BOTTLES DRAWN AEROBIC AND ANAEROBIC Blood Culture adequate volume   Culture   Final    NO GROWTH 4 DAYS Performed at [facility], [address], [place], [place] [address]    Report Status PENDING  Incomplete  Blood culture (routine x 2)     Status: None (Preliminary result)   Collection Time: 11/25/18  7:40 PM   Specimen: BLOOD  Result Value Ref Range Status   Specimen Description BLOOD RIGHT ANTECUBITAL  Final   Special Requests   Final    BOTTLES DRAWN AEROBIC AND ANAEROBIC Blood Culture results may not be optimal due to an excessive volume of blood received in culture bottles   Culture   Final    NO GROWTH 4 DAYS Performed at [facility], [address], [place], [place] [address]    Report Status PENDING  Incomplete  SARS Coronavirus 2 by RT PCR (hospital order, performed in [place] hospital lab) Nasopharyngeal Nasopharyngeal Swab     Status: None   Collection Time: 11/26/18 10:03 AM   Specimen: Nasopharyngeal Swab  Result Value Ref Range Status   SARS Coronavirus 2 NEGATIVE NEGATIVE Final    Comment: (NOTE) If result is NEGATIVE SARS-CoV-2 target nucleic acids are NOT DETECTED. The SARS-CoV-2 RNA is generally detectable in upper and lower  respiratory specimens during the acute phase of infection. The lowest   concentration of SARS-CoV-2 viral copies this assay can detect is 250  copies / mL. A negative result does not preclude SARS-CoV-2 infection  and should not be used as the sole basis for treatment or other  patient management decisions.  A negative result may occur with  improper specimen collection / handling, submission of specimen other  than nasopharyngeal swab, presence of viral mutation(s) within the  areas targeted by this assay, and inadequate number of viral copies  (<250 copies / mL). A negative result must be combined with clinical  observations, patient history, and epidemiological information. If result is POSITIVE SARS-CoV-2 target nucleic acids are DETECTED. The SARS-CoV-2 RNA is generally detectable in upper and lower  respiratory specimens dur ing the acute phase of infection.  Positive  results are indicative of active infection with SARS-CoV-2.  Clinical  correlation with patient history and other diagnostic information is  necessary to determine patient infection status.  Positive results do  not rule out bacterial infection or co-infection with other viruses. If result is PRESUMPTIVE POSTIVE SARS-CoV-2 nucleic acids MAY BE PRESENT.   A presumptive positive result was  obtained on the submitted specimen  and confirmed on repeat testing.  While 2019 novel coronavirus  (SARS-CoV-2) nucleic acids may be present in the submitted sample  additional confirmatory testing may be necessary for epidemiological  and / or clinical management purposes  to differentiate between  SARS-CoV-2 and other Sarbecovirus currently known to infect humans.  If clinically indicated additional testing with an alternate test  methodology [phone]) is advised. The SARS-CoV-2 RNA is generally  detectable in upper and lower respiratory sp ecimens during the acute  phase of infection. The expected result is Negative. Fact Sheet for Patients:  StrictlyIdeas.no Fact Sheet  for Healthcare Providers: BankingDealers.co.za This test is not yet approved or cleared by the [place] FDA and has been authorized for detection and/or diagnosis of SARS-CoV-2 by FDA under an Emergency Use Authorization (EUA).  This EUA will remain in effect (meaning this test can be used) for the duration of the COVID-19 declaration under Section 564(b)(1) of the Act, 21 U.S.C. section 360bbb-3(b)(1), unless the authorization is terminated or revoked sooner. Performed at [facility], [address] [place], [place] [address]   Respiratory Panel by PCR     Status: None   Collection Time: 11/26/18 11:59 PM   Specimen: Urine, Random; Respiratory  Result Value Ref Range Status   Adenovirus NOT DETECTED NOT DETECTED Final   Coronavirus 229E NOT DETECTED NOT DETECTED Final    Comment: (NOTE) The Coronavirus on the Respiratory Panel, DOES NOT test for the novel  Coronavirus (2019 nCoV)    Coronavirus HKU1 NOT DETECTED NOT DETECTED Final   Coronavirus NL63 NOT DETECTED NOT DETECTED Final   Coronavirus OC43 NOT DETECTED NOT DETECTED Final   Metapneumovirus NOT DETECTED NOT DETECTED Final   Rhinovirus / Enterovirus NOT DETECTED NOT DETECTED Final   Influenza A NOT DETECTED NOT DETECTED Final   Influenza B NOT DETECTED NOT DETECTED Final   Parainfluenza Virus 1 NOT DETECTED NOT DETECTED Final   Parainfluenza Virus 2 NOT DETECTED NOT DETECTED Final   Parainfluenza Virus 3 NOT DETECTED NOT DETECTED Final   Parainfluenza Virus 4 NOT DETECTED NOT DETECTED Final   Respiratory Syncytial Virus NOT DETECTED NOT DETECTED Final   Bordetella pertussis NOT DETECTED NOT DETECTED Final   Chlamydophila pneumoniae NOT DETECTED NOT DETECTED Final   Mycoplasma pneumoniae NOT DETECTED NOT DETECTED Final    Comment: Performed at [facility], [place] [address] [place], [place] [address]    Coagulation Studies: No results for input(s): LABPROT, INR in the last 72  hours.  Urinalysis: No results for input(s): COLORURINE, LABSPEC, PHURINE, GLUCOSEU, HGBUR, BILIRUBINUR, KETONESUR, PROTEINUR, UROBILINOGEN, NITRITE, LEUKOCYTESUR in the last 72 hours.  Invalid input(s): APPERANCEUR    Imaging: Dg Chest 1 View  Result Date: 12/02/2018 CLINICAL DATA:  Shortness of breath EXAM: CHEST  1 VIEW COMPARISON:  Radiograph 12/01/2018, CT 11/25/2018 FINDINGS: Coarse interstitial changes throughout the lungs compatible with known history of pulmonary fibrosis. Lung volumes are diminished with increasing hazy opacity which could reflect atelectasis or underlying infection. Cardiomegaly is similar to comparison is. Atherosclerotic calcification of the aorta is noted. Degenerative changes are present in the imaged spine and shoulders. IMPRESSION: 1. Coarse interstitial changes throughout the lungs compatible with known history of pulmonary interstitial disease. 2. Diminishing lung volumes with increasing hazy opacity which could reflect atelectasis or airspace disease. Electronically Signed   By: Lovena Le M.D.   On: 12/02/2018 22:32      Assessment & Plan: Gerald Clark  is a 73 y.o. white male with diabetes mellitus type II, hypertension, interstitial lung disease, peripheral vascular, GERD, who was admitted to [facility] on 11/25/2018 for Acute respiratory distress [R06.03] Hypoxia [R09.02] Community acquired pneumonia [J18.9]  1. Hyponatremia: acute on chronic. Baseline 130-135 Urine sodium less than 20. However elevated urine osm and euvolemic on examination. 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 Also with polydipsia from dry mouth and cough.  - Discontinue normal saline.  - Start fluid restriction - Discontinue hydrochlorothiazide.   LOS: 8 Sarath Kolluru 11/9/20204:52 PM

## 2018-12-03 NOTE — Progress Notes (Signed)
Asked by MD to assist with insulin teaching for this pt.  Plan to d/c pt on Lantus and Novolog.  Spoke w/ pt and wife about taking insulin at home.  Explained what Lantus and Novolog insulins are, how they work, when to take, doses, etc.    Showed pt and wife how to correctly draw up and administer insulin with vial and syringe technique (including adding air to vial, drawing up the insulin in the syringe, how to read the syringe, injecting the insulin, and disposal of syringes).  Pt's wife able to successfully draw up and administer practice injection into teaching injection site.  Have asked RN caring for pt to allow pt and wife give all injections this afternoon.  Have also ordered insulin teaching kit for pt.  Also reviewed signs and symptoms of HYPO and how to treat HYPO at home.    --Will follow patient during hospitalization--  Jeannine Johnston Fishel RN, MSN, CDE Diabetes Coordinator Inpatient Glycemic Control Team Team Pager: [phone] (8a-5p)  

## 2018-12-03 NOTE — TOC Transition Note (Signed)
Transition of Care [facility]) - CM/SW Discharge Note   Patient Details  Name: Gerald Clark MRN: [id number] Date of Birth: 09/14/1945  Transition of Care [facility] [facility]) CM/SW Contact:  Shelbie Hutching, RN Phone Number: 12/03/2018, 4:25 PM   Clinical Narrative:    Oxygen delivered to the room, walker delivered to the room, home health has been arranged with [facility].     Final next level of care: [place] Barriers to Discharge: No Barriers Identified, Barriers Resolved   Patient Goals and CMS Choice Patient states their goals for this hospitalization and ongoing recovery are:: Ready to go home CMS Medicare.gov Compare Post Acute Care list provided to:: Patient Choice offered to / list presented to : Patient  Discharge Placement                       Discharge Plan and Services   Discharge Planning Services: CM Consult Post Acute Care Choice: Home Health          DME Arranged: Walker rolling, Oxygen DME Agency: [employer] Date DME Agency Contacted: 12/03/18 Time DME Agency Contacted: 1527 Representative spoke with at DME Agency: Georgina Snell Stevens: RN, PT [facility] Agency: [facility] (formerly [facility]) Date [place]: 12/03/18 Time [place]: 1528 Representative spoke with at [place]: [place] ([place]) Interventions     Readmission Risk Interventions No flowsheet data found.

## 2018-12-03 NOTE — Progress Notes (Signed)
PROGRESS NOTE    Gerald Clark  [id number] DOB: 1945/04/24 DOA: 11/25/2018  PCP: Adin Hector, MD    LOS - 8   Brief Narrative:  73 y.o.Caucasian malewith history of diabetes mellitus, hypertension, dyslipidemia peripheral vascular disease, who presented to the emergency room with acuteworseningof dyspneathat had been ongoing forthe last 4 weeks.No associated cough or wheezing, N/V/D, loss of taste or smell. Subjective chills but afebrile. No sick contacts or COVID exposure. In the ED, patient was tachypneic and hypoxic with spO2 72% on room air which improved with NRB mask. CTA chest ruled out PE, but did show extensive interstitial lung disease including a prominent ground glass opacity in lower lobes, with mediastinal and hilar lymphadenopathy, and mild underlying emphysema. COVID-19 tested and negative. He was treated with IV Solu-medrol and IV fluids before admission. Pulmonology Consulted. Has continued on IV steroids, diuresis and supplemental oxygen, currently weaning down.  Subjective 11/9: Patient seen, sitting up in bed, wife at bedside.  Overnight, had a vasovagal episode when up for a bowel movement.  Asymptomatic today, denies dizziness or lightheadedness.  Reports shortness of breath w exertion about the same, but none when at rest.  Denies fever/chills, chest pain, N/V/D.    Assessment & Plan:   Principal Problem:   Acute hypoxemic respiratory failure (HCC) Active Problems:   Hypertension   Diabetes mellitus without complication (HCC)   PAD (peripheral artery disease) (HCC)   CAP (community acquired pneumonia)   Community acquired pneumonia   Hypoxia   Interstitial lung disease ([place])   Bandemia   Mediastinal adenopathy   Malnutrition of moderate degree   Acute hypoxemic respiratory failure:apparently secondary to exacerbation of underlying, previously undiagnosed(?) interstitial lung disease.  Interstitial infiltrates?  Community-acquired pneumonia:vs above (interstitial lung disease/pulmonary fibrosis with exacerbation).Coronavirus test is negative x2. - Pulmonology following, appreciate recs: - continue supplemental O2 forspO2 > 88% - continue prednisone 50 mg daily until pulm follow up - Levaquin, course completed for CA-PNA - stopped lasix  -Patient currently requiring 2 L/min oxygen at rest, 4-5 L/min with ambulation  Constipation -patient reports no BM since despite Colace, milk of magnesia.  We will add lactulose today.  Hyponatremia - patient euvolemic on exam, high urine osm, low urine sodium.  Nephrology consulted.  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  Sodium has slowly down-trended over admission, nadir 124 today. - fluid restriction - stop HCTZ -Recheck BMP tomorrow  Non-sustained V-tach(on telemetry 11/4): patient asymptomatic at the time.  - maintain K>4.0, Mg>2.0 - keep on telemetry  Mediastinal and hilar lymphadenopathy: Follow-up with pulmonologist for further recommendations.  Hypokalemia:resolved.Repeat BMP. Maintain K>4.0.  Leukocytosis:secondary to steroids. Monitor withCBC's.  Peripheral vascular disease: - continue homeaspirin and Plavix   Hypertension: had had soft to normal blood pressure throughout admission. - HCTZ stopped due to hyponatremia - Held lisinopril, discontinue on discharge  Type II DM: Hyperglycemia secondary to steroids - improved on current regimen Metformin, Actos and glipizide have been held for now. Have been titrating up insulin past few days, however still worse hyperglycemia 300s at times.  We will continue monitor and titrate up as needed.  Patient not on insulin at baseline, states he thinks he can self administer if needed after discharge.  Will likely need to be on insulin while continued on steroids. - cont short acting with mealsagain (to15units) - cont  Lantus 20 units) qHS - cont sliding scale insulin moderate    DVT prophylaxis:Lovenox Code Status: DNR Family Communication: wife at  bedside today Disposition Plan: PT recommends home health PT.  Expect home tomorrow pending sodium improvement with fluid restriction.  Consultants:  Pulmonology  Procedures:  none  Antimicrobials:   Levaquin PO - completed course 11/2-11/7  Rocephin & Azithromycin 11/1-11/2   Objective: Vitals:   12/02/18 2200 12/03/18 0548 12/03/18 0732 12/03/18 0745  BP: 125/62 (!) 106/58 (!) 113/56   Pulse: 80 94 90   Resp:   16   Temp:  (!) 96.4 F (35.8 C) 98.4 F (36.9 C)   TempSrc:  Axillary    SpO2: 95% 97% 93% 92%  Weight:      Height:        Intake/Output Summary (Last 24 hours) at 12/03/2018 0830 Last data filed at 12/03/2018 0558 Gross per 24 hour  Intake 720 ml  Output 925 ml  Net -205 ml   Filed Weights   11/25/18 1517 11/27/18 1528  Weight: 72.6 kg 69.4 kg    Examination:  General exam: awake, alert, no acute distress Respiratory system: bibasilar crackles, no wheezes or rhonchi, normal respiratory effort. On 2 L/min O2 [place]. Cardiovascular system: normal S1/S2, RRR, no JVD, murmurs, rubs, gallops, no pedal edema.   Gastrointestinal system: soft, non-tender, non-distended abdomen Central nervous system: alert and oriented x4. no gross focal neurologic deficits, normal speech Extremities: moves all, no edema, normal tone Skin: dry, intact, normal temperature Psychiatry: normal mood, congruent affect, judgement and insight appear normal    Data Reviewed: I have personally reviewed following labs and imaging studies  CBC: Recent Labs  Lab 11/27/18 1351 11/29/18 0406 11/30/18 0431 12/01/18 0408 12/02/18 0435 12/03/18 0439  WBC 19.0* 19.1* 19.6* 20.4* 28.2* 27.2*  NEUTROABS 16.5*  --  15.5* 16.6* 22.3* 21.5*  HGB 13.8 14.0 14.5 15.2 14.9 13.3  HCT 41.8 43.4 45.1 46.1 44.5 38.6*  MCV 83.6 82.7 83.2 82.6  81.1 80.1  PLT 454* 577* 537* 537* 569* 0000000*   Basic Metabolic Panel: Recent Labs  Lab 11/28/18 1445 11/29/18 0406 11/30/18 0431 12/01/18 0408 12/02/18 0435 12/03/18 0439  NA  --  132* 131* 129* 127* 125*  K 3.6 3.9 4.1 3.9 5.0 4.7  CL  --  94* 94* 90* 88* 86*  CO2  --  28 27 26 29 30   GLUCOSE  --  222* 222* 232* 262* 128*  BUN  --  54* 68* 67* 83* 95*  CREATININE  --  0.86 1.00 0.99 1.02 1.21  CALCIUM  --  9.7 9.3 9.6 9.7 9.6  MG 2.4  --  2.7*  --   --  3.2*   GFR: Estimated Creatinine Clearance: 49.1 mL/min (by C-G formula based on SCr of 1.21 mg/dL). Liver Function Tests: Recent Labs  Lab 11/26/18 1003  AST 38  ALT 18  ALKPHOS 59  BILITOT 1.4*  PROT 8.2*  ALBUMIN 2.9*   No results for input(s): LIPASE, AMYLASE in the last 168 hours. No results for input(s): AMMONIA in the last 168 hours. Coagulation Profile: Recent Labs  Lab 11/28/18 0339  INR 1.3*   Cardiac Enzymes: No results for input(s): CKTOTAL, CKMB, CKMBINDEX, TROPONINI in the last 168 hours. BNP (last 3 results) No results for input(s): PROBNP in the last 8760 hours. HbA1C: No results for input(s): HGBA1C in the last 72 hours. CBG: Recent Labs  Lab 12/02/18 0741 12/02/18 1140 12/02/18 1748 12/02/18 2124 12/03/18 0735  GLUCAP 313* 303* 209* 140* 168*   Lipid Profile: No results for input(s): CHOL, HDL, LDLCALC, TRIG, CHOLHDL, LDLDIRECT in  the last 72 hours. Thyroid Function Tests: No results for input(s): TSH, T4TOTAL, FREET4, T3FREE, THYROIDAB in the last 72 hours. Anemia Panel: No results for input(s): VITAMINB12, FOLATE, FERRITIN, TIBC, IRON, RETICCTPCT in the last 72 hours. Sepsis Labs: Recent Labs  Lab 11/26/18 1638 11/27/18 0409 11/28/18 0339  PROCALCITON 0.11 0.16 0.11    Recent Results (from the past 240 hour(s))  SARS Coronavirus 2 by RT PCR (hospital order, performed in [facility] hospital lab) Nasopharyngeal Nasopharyngeal Swab     Status: None   Collection Time:  11/25/18  3:27 PM   Specimen: Nasopharyngeal Swab  Result Value Ref Range Status   SARS Coronavirus 2 NEGATIVE NEGATIVE Final    Comment: (NOTE) If result is NEGATIVE SARS-CoV-2 target nucleic acids are NOT DETECTED. The SARS-CoV-2 RNA is generally detectable in upper and lower  respiratory specimens during the acute phase of infection. The lowest  concentration of SARS-CoV-2 viral copies this assay can detect is 250  copies / mL. A negative result does not preclude SARS-CoV-2 infection  and should not be used as the sole basis for treatment or other  patient management decisions.  A negative result may occur with  improper specimen collection / handling, submission of specimen other  than nasopharyngeal swab, presence of viral mutation(s) within the  areas targeted by this assay, and inadequate number of viral copies  (<250 copies / mL). A negative result must be combined with clinical  observations, patient history, and epidemiological information. If result is POSITIVE SARS-CoV-2 target nucleic acids are DETECTED. The SARS-CoV-2 RNA is generally detectable in upper and lower  respiratory specimens dur ing the acute phase of infection.  Positive  results are indicative of active infection with SARS-CoV-2.  Clinical  correlation with patient history and other diagnostic information is  necessary to determine patient infection status.  Positive results do  not rule out bacterial infection or co-infection with other viruses. If result is PRESUMPTIVE POSTIVE SARS-CoV-2 nucleic acids MAY BE PRESENT.   A presumptive positive result was obtained on the submitted specimen  and confirmed on repeat testing.  While 2019 novel coronavirus  (SARS-CoV-2) nucleic acids may be present in the submitted sample  additional confirmatory testing may be necessary for epidemiological  and / or clinical management purposes  to differentiate between  SARS-CoV-2 and other Sarbecovirus currently known to  infect humans.  If clinically indicated additional testing with an alternate test  methodology [phone]) is advised. The SARS-CoV-2 RNA is generally  detectable in upper and lower respiratory sp ecimens during the acute  phase of infection. The expected result is Negative. Fact Sheet for Patients:  StrictlyIdeas.no Fact Sheet for Healthcare Providers: BankingDealers.co.za This test is not yet approved or cleared by the [place] FDA and has been authorized for detection and/or diagnosis of SARS-CoV-2 by FDA under an Emergency Use Authorization (EUA).  This EUA will remain in effect (meaning this test can be used) for the duration of the COVID-19 declaration under Section 564(b)(1) of the Act, 21 U.S.C. section 360bbb-3(b)(1), unless the authorization is terminated or revoked sooner. Performed at [facility], [address] [place], [place] [address]   Blood culture (routine x 2)     Status: None (Preliminary result)   Collection Time: 11/25/18  3:27 PM   Specimen: BLOOD  Result Value Ref Range Status   Specimen Description BLOOD LEFT ANTECUBITAL  Final   Special Requests   Final    BOTTLES DRAWN AEROBIC AND ANAEROBIC Blood Culture adequate  volume   Culture   Final    NO GROWTH 4 DAYS Performed at [facility], [address] [place], [place] [address]    Report Status PENDING  Incomplete  Blood culture (routine x 2)     Status: None (Preliminary result)   Collection Time: 11/25/18  7:40 PM   Specimen: BLOOD  Result Value Ref Range Status   Specimen Description BLOOD RIGHT ANTECUBITAL  Final   Special Requests   Final    BOTTLES DRAWN AEROBIC AND ANAEROBIC Blood Culture results may not be optimal due to an excessive volume of blood received in culture bottles   Culture   Final    NO GROWTH 4 DAYS Performed at [facility], [address], [place], [place] [address]    Report Status PENDING   Incomplete  SARS Coronavirus 2 by RT PCR (hospital order, performed in [facility] hospital lab) Nasopharyngeal Nasopharyngeal Swab     Status: None   Collection Time: 11/26/18 10:03 AM   Specimen: Nasopharyngeal Swab  Result Value Ref Range Status   SARS Coronavirus 2 NEGATIVE NEGATIVE Final    Comment: (NOTE) If result is NEGATIVE SARS-CoV-2 target nucleic acids are NOT DETECTED. The SARS-CoV-2 RNA is generally detectable in upper and lower  respiratory specimens during the acute phase of infection. The lowest  concentration of SARS-CoV-2 viral copies this assay can detect is 250  copies / mL. A negative result does not preclude SARS-CoV-2 infection  and should not be used as the sole basis for treatment or other  patient management decisions.  A negative result may occur with  improper specimen collection / handling, submission of specimen other  than nasopharyngeal swab, presence of viral mutation(s) within the  areas targeted by this assay, and inadequate number of viral copies  (<250 copies / mL). A negative result must be combined with clinical  observations, patient history, and epidemiological information. If result is POSITIVE SARS-CoV-2 target nucleic acids are DETECTED. The SARS-CoV-2 RNA is generally detectable in upper and lower  respiratory specimens dur ing the acute phase of infection.  Positive  results are indicative of active infection with SARS-CoV-2.  Clinical  correlation with patient history and other diagnostic information is  necessary to determine patient infection status.  Positive results do  not rule out bacterial infection or co-infection with other viruses. If result is PRESUMPTIVE POSTIVE SARS-CoV-2 nucleic acids MAY BE PRESENT.   A presumptive positive result was obtained on the submitted specimen  and confirmed on repeat testing.  While 2019 novel coronavirus  (SARS-CoV-2) nucleic acids may be present in the submitted sample  additional  confirmatory testing may be necessary for epidemiological  and / or clinical management purposes  to differentiate between  SARS-CoV-2 and other Sarbecovirus currently known to infect humans.  If clinically indicated additional testing with an alternate test  methodology [phone]) is advised. The SARS-CoV-2 RNA is generally  detectable in upper and lower respiratory sp ecimens during the acute  phase of infection. The expected result is Negative. Fact Sheet for Patients:  StrictlyIdeas.no Fact Sheet for Healthcare Providers: BankingDealers.co.za This test is not yet approved or cleared by the [place] FDA and has been authorized for detection and/or diagnosis of SARS-CoV-2 by FDA under an Emergency Use Authorization (EUA).  This EUA will remain in effect (meaning this test can be used) for the duration of the COVID-19 declaration under Section 564(b)(1) of the Act, 21 U.S.C. section 360bbb-3(b)(1), unless the authorization is terminated  or revoked sooner. Performed at [facility], [address] [place], [place] [address]   Respiratory Panel by PCR     Status: None   Collection Time: 11/26/18 11:59 PM   Specimen: Urine, Random; Respiratory  Result Value Ref Range Status   Adenovirus NOT DETECTED NOT DETECTED Final   Coronavirus 229E NOT DETECTED NOT DETECTED Final    Comment: (NOTE) The Coronavirus on the Respiratory Panel, DOES NOT test for the novel  Coronavirus (2019 nCoV)    Coronavirus HKU1 NOT DETECTED NOT DETECTED Final   Coronavirus NL63 NOT DETECTED NOT DETECTED Final   Coronavirus OC43 NOT DETECTED NOT DETECTED Final   Metapneumovirus NOT DETECTED NOT DETECTED Final   Rhinovirus / Enterovirus NOT DETECTED NOT DETECTED Final   Influenza A NOT DETECTED NOT DETECTED Final   Influenza B NOT DETECTED NOT DETECTED Final   Parainfluenza Virus 1 NOT DETECTED NOT DETECTED Final   Parainfluenza Virus 2 NOT DETECTED  NOT DETECTED Final   Parainfluenza Virus 3 NOT DETECTED NOT DETECTED Final   Parainfluenza Virus 4 NOT DETECTED NOT DETECTED Final   Respiratory Syncytial Virus NOT DETECTED NOT DETECTED Final   Bordetella pertussis NOT DETECTED NOT DETECTED Final   Chlamydophila pneumoniae NOT DETECTED NOT DETECTED Final   Mycoplasma pneumoniae NOT DETECTED NOT DETECTED Final    Comment: Performed at [facility], [place] [address], [place], [place] [address]         Radiology Studies: Dg Chest 1 View  Result Date: 12/02/2018 CLINICAL DATA:  Shortness of breath EXAM: CHEST  1 VIEW COMPARISON:  Radiograph 12/01/2018, CT 11/25/2018 FINDINGS: Coarse interstitial changes throughout the lungs compatible with known history of pulmonary fibrosis. Lung volumes are diminished with increasing hazy opacity which could reflect atelectasis or underlying infection. Cardiomegaly is similar to comparison is. Atherosclerotic calcification of the aorta is noted. Degenerative changes are present in the imaged spine and shoulders. IMPRESSION: 1. Coarse interstitial changes throughout the lungs compatible with known history of pulmonary interstitial disease. 2. Diminishing lung volumes with increasing hazy opacity which could reflect atelectasis or airspace disease. Electronically Signed   By: Lovena Le M.D.   On: 12/02/2018 22:32        Scheduled Meds: . aspirin EC  81 mg Oral Daily  . brimonidine  1 drop Both Eyes BID  . clopidogrel  75 mg Oral Daily  . docusate sodium  100 mg Oral BID  . dorzolamide  1 drop Both Eyes BID  . enoxaparin (LOVENOX) injection  40 mg Subcutaneous Q24H  . insulin aspart  0-15 Units Subcutaneous TID WC  . insulin aspart  15 Units Subcutaneous TID WC  . insulin glargine  20 Units Subcutaneous QHS  . ipratropium-albuterol  3 mL Nebulization TID  . latanoprost  1 drop Both Eyes QHS  . magnesium oxide  400 mg Oral Daily  . multivitamin with minerals  1 tablet Oral Daily  . niacin  500  mg Oral QHS  . pantoprazole  40 mg Oral Daily  . predniSONE  50 mg Oral Q breakfast  . Ensure Max Protein  11 oz Oral BID BM  . sodium chloride flush  3 mL Intravenous Q12H  . timolol  1 drop Both Eyes BID   Continuous Infusions: . sodium chloride       LOS: 8 days    Time spent: 30-35 minutes    Ezekiel Slocumb, DO [facility] Pager: [phone]  If 7PM-7AM, please contact night-coverage [email or web address] Password  [email or web address] 12/03/2018, 8:30 AM

## 2018-12-03 NOTE — Progress Notes (Signed)
2155 patient was assisted to bsc by NT [place]. While on the toilet and having bowel movement patient became unresponsive. Rapid was called. Patient was very pale, starting off and limbs limp at his side, BP was elevated 140/104, HR was 83 and sats were 79 on 2L [employer]. Patient placed on non rebreather and lifted back to bed. Once in bed patient color returned, he began to wake up and was again alert and oriented x  4. He did not remember events that occurred immediately prior. Oxygen was weaned back to 2L . Rufina Falco NP at bedside. Order 500cc NS bolus and portable chest xray.  Patient able to engage in conversation. Will continue to monitor closely. Patient NSR on monitor.

## 2018-12-04 DIAGNOSIS — E222 Syndrome of inappropriate secretion of antidiuretic hormone: Secondary | ICD-10-CM | POA: Clinically undetermined

## 2018-12-04 LAB — CULTURE, BLOOD (ROUTINE X 2)
Culture: NO GROWTH
Culture: NO GROWTH
Special Requests: ADEQUATE

## 2018-12-04 LAB — BASIC METABOLIC PANEL
Anion gap: 8 (ref 5–15)
BUN: 43 mg/dL — ABNORMAL HIGH (ref 8–23)
CO2: 26 mmol/L (ref 22–32)
Calcium: 8.7 mg/dL — ABNORMAL LOW (ref 8.9–10.3)
Chloride: 94 mmol/L — ABNORMAL LOW (ref 98–111)
Creatinine, Ser: 0.67 mg/dL (ref 0.61–1.24)
GFR calc Af Amer: >60 mL/min (ref >60)
GFR calc non Af Amer: >60 mL/min (ref >60)
Glucose, Bld: 79 mg/dL (ref 70–99)
Potassium: 3.9 mmol/L (ref 3.5–5.1)
Sodium: 128 mmol/L — ABNORMAL LOW (ref 135–145)

## 2018-12-04 LAB — CBC WITH DIFFERENTIAL/PLATELET
Abs Immature Granulocytes: 0.57 10*3/uL — ABNORMAL HIGH (ref 0.00–0.07)
Basophils Absolute: 0.1 10*3/uL (ref 0.0–0.1)
Basophils Relative: 0 %
Eosinophils Absolute: 0.1 10*3/uL (ref 0.0–0.5)
Eosinophils Relative: 0 %
HCT: 36.7 % — ABNORMAL LOW (ref 39.0–52.0)
Hemoglobin: 12.4 g/dL — ABNORMAL LOW (ref 13.0–17.0)
Immature Granulocytes: 2 %
Lymphocytes Relative: 15 %
Lymphs Abs: 4 10*3/uL (ref 0.7–4.0)
MCH: 27.3 pg (ref 26.0–34.0)
MCHC: 33.8 g/dL (ref 30.0–36.0)
MCV: 80.8 fL (ref 80.0–100.0)
Monocytes Absolute: 2.9 10*3/uL — ABNORMAL HIGH (ref 0.1–1.0)
Monocytes Relative: 11 %
Neutro Abs: 18.4 10*3/uL — ABNORMAL HIGH (ref 1.7–7.7)
Neutrophils Relative %: 72 %
Platelets: 494 10*3/uL — ABNORMAL HIGH (ref 150–400)
RBC: 4.54 MIL/uL (ref 4.22–5.81)
RDW: 13.8 % (ref 11.5–15.5)
Smear Review: NORMAL
WBC: 25.9 10*3/uL — ABNORMAL HIGH (ref 4.0–10.5)
nRBC: 0 % (ref 0.0–0.2)

## 2018-12-04 LAB — MAGNESIUM: Magnesium: 2.1 mg/dL (ref 1.7–2.4)

## 2018-12-04 LAB — GLUCOSE, CAPILLARY
Glucose-Capillary: 80 mg/dL (ref 70–99)
Glucose-Capillary: 151 mg/dL — ABNORMAL HIGH (ref 70–99)

## 2018-12-04 MED ORDER — MOMETASONE FURO-FORMOTEROL FUM 100-5 MCG/ACT IN AERO: 2.0000 | INHALATION_SPRAY | Freq: Two times a day (BID) | RESPIRATORY_TRACT | 1 refills | Status: DC

## 2018-12-04 MED ORDER — BIOTENE DRY MOUTH MT LIQD: 15.0000 mL | OROMUCOSAL | Status: DC | PRN

## 2018-12-04 MED ORDER — INSULIN STARTER KIT- SYRINGES (ENGLISH): 1.0000 | Freq: Once | 0 refills | Status: AC

## 2018-12-04 MED ORDER — GLUCERNA SHAKE PO LIQD: 237.0000 mL | Freq: Three times a day (TID) | ORAL | 0 refills | Status: DC

## 2018-12-04 MED ORDER — MOMETASONE FURO-FORMOTEROL FUM 100-5 MCG/ACT IN AERO
2.0000 | INHALATION_SPRAY | Freq: Two times a day (BID) | RESPIRATORY_TRACT | Status: DC
  Administered 2018-12-04: 2 via RESPIRATORY_TRACT
  Filled 2018-12-04: qty 8.8

## 2018-12-04 NOTE — Progress Notes (Signed)
Removed IV before discharge. Educated patient and wife on all medications and administration of. Educated on all discharge orders and how to use the inhaler and insulin syringes. Patient and wife state they understand and they do not have any questions.

## 2018-12-04 NOTE — Progress Notes (Signed)
[facility]  ROUNDING NOTE   Subjective:   Na 128  Wife at bedside.   Objective:  Vital signs in last 24 hours:  Temp:  [98.1 F (36.7 C)-98.2 F (36.8 C)] 98.2 F (36.8 C) (11/10 0832) Pulse Rate:  [82-100] 100 (11/10 0832) Resp:  [17-18] 18 (11/10 0832) BP: (100-113)/(51-58) 100/51 (11/10 0832) SpO2:  [91 %-95 %] 91 % (11/10 0832)  Weight change:  Filed Weights   11/25/18 1517 11/27/18 1528  Weight: 72.6 kg 69.4 kg    Intake/Output: I/O last 3 completed shifts: In: 597 [P.O.:597] Out: 1700 [Urine:1700]   Intake/Output this shift:  No intake/output data recorded.  Physical Exam: General: NAD, laying in bed  Head: Normocephalic, atraumatic. Moist oral mucosal membranes  Eyes: Anicteric, PERRL  Neck: Supple, trachea midline  Lungs:  Clear to auscultation  Heart: Regular rate and rhythm  Abdomen:  Soft, nontender,   Extremities:  no peripheral edema.  Neurologic: Nonfocal, moving all four extremities  Skin: No lesions        Basic Metabolic Panel: Recent Labs  Lab 11/28/18 1445  11/30/18 0431 12/01/18 0408 12/02/18 0435 12/03/18 0439 12/03/18 1330 12/04/18 0439  NA  --    < > 131* 129* 127* 125* 124* 128*  K 3.6   < > 4.1 3.9 5.0 4.7 4.5 3.9  CL  --    < > 94* 90* 88* 86* 89* 94*  CO2  --    < > 27 26 29 30 27 26   GLUCOSE  --    < > 222* 232* 262* 128* 127* 79  BUN  --    < > 68* 67* 83* 95* 72* 43*  CREATININE  --    < > 1.00 0.99 1.02 1.21 0.81 0.67  CALCIUM  --    < > 9.3 9.6 9.7 9.6 8.7* 8.7*  MG 2.4  --  2.7*  --   --  3.2*  --  2.1   < > = values in this interval not displayed.    Liver Function Tests: No results for input(s): AST, ALT, ALKPHOS, BILITOT, PROT, ALBUMIN in the last 168 hours. No results for input(s): LIPASE, AMYLASE in the last 168 hours. No results for input(s): AMMONIA in the last 168 hours.  CBC: Recent Labs  Lab 11/30/18 0431 12/01/18 0408 12/02/18 0435 12/03/18 0439 12/04/18 0439  WBC 19.6* 20.4*  28.2* 27.2* 25.9*  NEUTROABS 15.5* 16.6* 22.3* 21.5* 18.4*  HGB 14.5 15.2 14.9 13.3 12.4*  HCT 45.1 46.1 44.5 38.6* 36.7*  MCV 83.2 82.6 81.1 80.1 80.8  PLT 537* 537* 569* 467* 494*    Cardiac Enzymes: No results for input(s): CKTOTAL, CKMB, CKMBINDEX, TROPONINI in the last 168 hours.  BNP: Invalid input(s): POCBNP  CBG: Recent Labs  Lab 12/03/18 1203 12/03/18 1632 12/03/18 2127 12/04/18 0807 12/04/18 1136  GLUCAP 103* 154* 32* 80 151*    Microbiology: Results for orders placed or performed during the hospital encounter of 11/25/18  SARS Coronavirus 2 by RT PCR (hospital order, performed in [facility] hospital lab) Nasopharyngeal Nasopharyngeal Swab     Status: None   Collection Time: 11/25/18  3:27 PM   Specimen: Nasopharyngeal Swab  Result Value Ref Range Status   SARS Coronavirus 2 NEGATIVE NEGATIVE Final    Comment: (NOTE) If result is NEGATIVE SARS-CoV-2 target nucleic acids are NOT DETECTED. The SARS-CoV-2 RNA is generally detectable in upper and lower  respiratory specimens during the acute phase of infection. The lowest  concentration of SARS-CoV-2 viral copies this assay can detect is 250  copies / mL. A negative result does not preclude SARS-CoV-2 infection  and should not be used as the sole basis for treatment or other  patient management decisions.  A negative result may occur with  improper specimen collection / handling, submission of specimen other  than nasopharyngeal swab, presence of viral mutation(s) within the  areas targeted by this assay, and inadequate number of viral copies  (<250 copies / mL). A negative result must be combined with clinical  observations, patient history, and epidemiological information. If result is POSITIVE SARS-CoV-2 target nucleic acids are DETECTED. The SARS-CoV-2 RNA is generally detectable in upper and lower  respiratory specimens dur ing the acute phase of infection.  Positive  results are indicative of active  infection with SARS-CoV-2.  Clinical  correlation with patient history and other diagnostic information is  necessary to determine patient infection status.  Positive results do  not rule out bacterial infection or co-infection with other viruses. If result is PRESUMPTIVE POSTIVE SARS-CoV-2 nucleic acids MAY BE PRESENT.   A presumptive positive result was obtained on the submitted specimen  and confirmed on repeat testing.  While 2019 novel coronavirus  (SARS-CoV-2) nucleic acids may be present in the submitted sample  additional confirmatory testing may be necessary for epidemiological  and / or clinical management purposes  to differentiate between  SARS-CoV-2 and other Sarbecovirus currently known to infect humans.  If clinically indicated additional testing with an alternate test  methodology [phone]) is advised. The SARS-CoV-2 RNA is generally  detectable in upper and lower respiratory sp ecimens during the acute  phase of infection. The expected result is Negative. Fact Sheet for Patients:  StrictlyIdeas.no Fact Sheet for Healthcare Providers: BankingDealers.co.za This test is not yet approved or cleared by the [place] FDA and has been authorized for detection and/or diagnosis of SARS-CoV-2 by FDA under an Emergency Use Authorization (EUA).  This EUA will remain in effect (meaning this test can be used) for the duration of the COVID-19 declaration under Section 564(b)(1) of the Act, 21 U.S.C. section 360bbb-3(b)(1), unless the authorization is terminated or revoked sooner. Performed at [facility], [address] [place], [place] [address]   Blood culture (routine x 2)     Status: None   Collection Time: 11/25/18  3:27 PM   Specimen: BLOOD  Result Value Ref Range Status   Specimen Description BLOOD LEFT ANTECUBITAL  Final   Special Requests   Final    BOTTLES DRAWN AEROBIC AND ANAEROBIC Blood Culture adequate  volume   Culture   Final    NO GROWTH 9 DAYS Performed at [facility], [address] [place], [place] [address]    Report Status 12/04/2018 FINAL  Final  Blood culture (routine x 2)     Status: None   Collection Time: 11/25/18  7:40 PM   Specimen: BLOOD  Result Value Ref Range Status   Specimen Description BLOOD RIGHT ANTECUBITAL  Final   Special Requests   Final    BOTTLES DRAWN AEROBIC AND ANAEROBIC Blood Culture results may not be optimal due to an excessive volume of blood received in culture bottles   Culture   Final    NO GROWTH 9 DAYS Performed at [facility], [address] [place], [place] [address]    Report Status 12/04/2018 FINAL  Final  SARS Coronavirus 2 by RT PCR (hospital order, performed in [facility] hospital lab)  Nasopharyngeal Nasopharyngeal Swab     Status: None   Collection Time: 11/26/18 10:03 AM   Specimen: Nasopharyngeal Swab  Result Value Ref Range Status   SARS Coronavirus 2 NEGATIVE NEGATIVE Final    Comment: (NOTE) If result is NEGATIVE SARS-CoV-2 target nucleic acids are NOT DETECTED. The SARS-CoV-2 RNA is generally detectable in upper and lower  respiratory specimens during the acute phase of infection. The lowest  concentration of SARS-CoV-2 viral copies this assay can detect is 250  copies / mL. A negative result does not preclude SARS-CoV-2 infection  and should not be used as the sole basis for treatment or other  patient management decisions.  A negative result may occur with  improper specimen collection / handling, submission of specimen other  than nasopharyngeal swab, presence of viral mutation(s) within the  areas targeted by this assay, and inadequate number of viral copies  (<250 copies / mL). A negative result must be combined with clinical  observations, patient history, and epidemiological information. If result is POSITIVE SARS-CoV-2 target nucleic acids are DETECTED. The SARS-CoV-2 RNA is generally  detectable in upper and lower  respiratory specimens dur ing the acute phase of infection.  Positive  results are indicative of active infection with SARS-CoV-2.  Clinical  correlation with patient history and other diagnostic information is  necessary to determine patient infection status.  Positive results do  not rule out bacterial infection or co-infection with other viruses. If result is PRESUMPTIVE POSTIVE SARS-CoV-2 nucleic acids MAY BE PRESENT.   A presumptive positive result was obtained on the submitted specimen  and confirmed on repeat testing.  While 2019 novel coronavirus  (SARS-CoV-2) nucleic acids may be present in the submitted sample  additional confirmatory testing may be necessary for epidemiological  and / or clinical management purposes  to differentiate between  SARS-CoV-2 and other Sarbecovirus currently known to infect humans.  If clinically indicated additional testing with an alternate test  methodology [phone]) is advised. The SARS-CoV-2 RNA is generally  detectable in upper and lower respiratory sp ecimens during the acute  phase of infection. The expected result is Negative. Fact Sheet for Patients:  StrictlyIdeas.no Fact Sheet for Healthcare Providers: BankingDealers.co.za This test is not yet approved or cleared by the [place] FDA and has been authorized for detection and/or diagnosis of SARS-CoV-2 by FDA under an Emergency Use Authorization (EUA).  This EUA will remain in effect (meaning this test can be used) for the duration of the COVID-19 declaration under Section 564(b)(1) of the Act, 21 U.S.C. section 360bbb-3(b)(1), unless the authorization is terminated or revoked sooner. Performed at [facility], [address] [place], [place] [address]   Respiratory Panel by PCR     Status: None   Collection Time: 11/26/18 11:59 PM   Specimen: Urine, Random; Respiratory  Result Value Ref  Range Status   Adenovirus NOT DETECTED NOT DETECTED Final   Coronavirus 229E NOT DETECTED NOT DETECTED Final    Comment: (NOTE) The Coronavirus on the Respiratory Panel, DOES NOT test for the novel  Coronavirus (2019 nCoV)    Coronavirus HKU1 NOT DETECTED NOT DETECTED Final   Coronavirus NL63 NOT DETECTED NOT DETECTED Final   Coronavirus OC43 NOT DETECTED NOT DETECTED Final   Metapneumovirus NOT DETECTED NOT DETECTED Final   Rhinovirus / Enterovirus NOT DETECTED NOT DETECTED Final   Influenza A NOT DETECTED NOT DETECTED Final   Influenza B NOT DETECTED NOT DETECTED Final   Parainfluenza Virus 1 NOT DETECTED NOT  DETECTED Final   Parainfluenza Virus 2 NOT DETECTED NOT DETECTED Final   Parainfluenza Virus 3 NOT DETECTED NOT DETECTED Final   Parainfluenza Virus 4 NOT DETECTED NOT DETECTED Final   Respiratory Syncytial Virus NOT DETECTED NOT DETECTED Final   Bordetella pertussis NOT DETECTED NOT DETECTED Final   Chlamydophila pneumoniae NOT DETECTED NOT DETECTED Final   Mycoplasma pneumoniae NOT DETECTED NOT DETECTED Final    Comment: Performed at [facility], [place] [address] [place], [place] [address]    Coagulation Studies: No results for input(s): LABPROT, INR in the last 72 hours.  Urinalysis: No results for input(s): COLORURINE, LABSPEC, PHURINE, GLUCOSEU, HGBUR, BILIRUBINUR, KETONESUR, PROTEINUR, UROBILINOGEN, NITRITE, LEUKOCYTESUR in the last 72 hours.  Invalid input(s): APPERANCEUR    Imaging: Dg Chest 1 View  Result Date: 12/02/2018 CLINICAL DATA:  Shortness of breath EXAM: CHEST  1 VIEW COMPARISON:  Radiograph 12/01/2018, CT 11/25/2018 FINDINGS: Coarse interstitial changes throughout the lungs compatible with known history of pulmonary fibrosis. Lung volumes are diminished with increasing hazy opacity which could reflect atelectasis or underlying infection. Cardiomegaly is similar to comparison is. Atherosclerotic calcification of the aorta is noted. Degenerative  changes are present in the imaged spine and shoulders. IMPRESSION: 1. Coarse interstitial changes throughout the lungs compatible with known history of pulmonary interstitial disease. 2. Diminishing lung volumes with increasing hazy opacity which could reflect atelectasis or airspace disease. Electronically Signed   By: Lovena Le M.D.   On: 12/02/2018 22:32     Medications:    . aspirin EC  81 mg Oral Daily  . brimonidine  1 drop Both Eyes BID  . clopidogrel  75 mg Oral Daily  . docusate sodium  100 mg Oral BID  . dorzolamide  1 drop Both Eyes BID  . enoxaparin (LOVENOX) injection  40 mg Subcutaneous Q24H  . feeding supplement (GLUCERNA SHAKE)  237 mL Oral TID BM  . insulin aspart  0-15 Units Subcutaneous TID WC  . insulin aspart  15 Units Subcutaneous TID WC  . insulin glargine  20 Units Subcutaneous QHS  . ipratropium-albuterol  3 mL Nebulization TID  . latanoprost  1 drop Both Eyes QHS  . magnesium oxide  400 mg Oral Daily  . mirtazapine  15 mg Oral QHS  . mometasone-formoterol  2 puff Inhalation BID  . multivitamin with minerals  1 tablet Oral Daily  . niacin  500 mg Oral QHS  . pantoprazole  40 mg Oral Daily  . predniSONE  50 mg Oral Q breakfast  . sodium chloride flush  3 mL Intravenous Q12H  . timolol  1 drop Both Eyes BID   acetaminophen **OR** acetaminophen, antiseptic oral rinse, lactulose, magnesium hydroxide, ondansetron **OR** ondansetron (ZOFRAN) IV, polyvinyl alcohol, senna, sodium chloride flush, traZODone  Assessment/ Plan:  Mr. Gerald Clark is a 73 y.o. white male with diabetes mellitus type II, hypertension, interstitial lung disease, peripheral vascular, GERD, who was admitted to [facility] on 11/25/2018 for pneumonia. Found to have interstitial lung disease.   1. Hyponatremia: acute on chronic. Baseline 130-135 Urine sodium less than 20. However elevated urine osm and euvolemic on examination. 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 Also with polydipsia from dry mouth and cough.  - Continue fluid restriction - Discontinued hydrochlorothiazide.     LOS: 9 Gerald Clark 11/10/202012:18 PM

## 2018-12-04 NOTE — Care Management Important Message (Signed)
Important Message  Patient Details  Name: Gerald Clark MRN: [id number] Date of Birth: 1945/10/21   Medicare Important Message Given:  Yes     Juliann Pulse A Dontavian Marchi 12/04/2018, 9:43 AM

## 2018-12-05 DIAGNOSIS — I10 Essential (primary) hypertension: Secondary | ICD-10-CM | POA: Diagnosis not present

## 2018-12-05 DIAGNOSIS — Z9981 Dependence on supplemental oxygen: Secondary | ICD-10-CM | POA: Diagnosis not present

## 2018-12-05 DIAGNOSIS — J439 Emphysema, unspecified: Secondary | ICD-10-CM | POA: Diagnosis not present

## 2018-12-05 DIAGNOSIS — K219 Gastro-esophageal reflux disease without esophagitis: Secondary | ICD-10-CM | POA: Diagnosis not present

## 2018-12-05 DIAGNOSIS — H409 Unspecified glaucoma: Secondary | ICD-10-CM | POA: Diagnosis not present

## 2018-12-05 DIAGNOSIS — I2510 Atherosclerotic heart disease of native coronary artery without angina pectoris: Secondary | ICD-10-CM | POA: Diagnosis not present

## 2018-12-05 DIAGNOSIS — E785 Hyperlipidemia, unspecified: Secondary | ICD-10-CM | POA: Diagnosis not present

## 2018-12-05 DIAGNOSIS — M47817 Spondylosis without myelopathy or radiculopathy, lumbosacral region: Secondary | ICD-10-CM | POA: Diagnosis not present

## 2018-12-05 DIAGNOSIS — E1151 Type 2 diabetes mellitus with diabetic peripheral angiopathy without gangrene: Secondary | ICD-10-CM | POA: Diagnosis not present

## 2018-12-06 NOTE — Discharge Summary (Signed)
Physician Discharge Summary  Gerald Clark GYB:[id number] DOB: 15-Oct-1945 DOA: 11/25/2018  PCP: Adin Hector, MD  Admit date: 11/25/2018 Discharge date: 12/06/2018  Admitted From: Home Disposition: Home  Recommendations for Outpatient Follow-up:  1. Follow up with PCP in 1-2 weeks 2. Please obtain BMP/CBC in one week 3. Please follow up with Pulmonology next week.  Home Health: Yes Equipment/Devices: oxygen, walker  Discharge Condition: Stable CODE STATUS: Full  Diet recommendation: Heart Healthy / Carb Modified / Fluid Restriction 1.5L/day  Brief/Interim Summary:  73 y.o.Caucasian malewith history of diabetes mellitus, hypertension, dyslipidemia peripheral vascular disease, who presented to the emergency room with acuteworseningof dyspneathat had been ongoing forthe last 4 weeks.No associated cough or wheezing, N/V/D, loss of taste or smell. Subjective chills but afebrile. No sick contacts or COVID exposure. In the ED, patient was tachypneic and hypoxic with spO2 72% on room air which improved with NRB mask. CTA chest ruled out PE, but did show extensive interstitial lung disease including a prominent ground glass opacity in lower lobes, with mediastinal and hilar lymphadenopathy, and mild underlying emphysema. COVID-19 negative. He was treated with IV Solu-medrol and IV fluids before admission. Pulmonology consulted. Has continued on IV steroids, diuresis and supplemental oxygen, initially requiring high flow nasal cannula. Eventually weaned to 2 L/min at rest, 4-5 L/min on ambulation.  Hyperglycemia secondary to steroid therapy with increasing insulin requirements.  Discharged on insulin in place of home regimen while continuing on steroids, and patient to closely follow up with PCP for adjustments.  SIADH due to underlying lung disease, sodium improved on fluid restriction and patient counseled on maintaining this.  Close follow up for BMP recommended.  Patient  stable for discharge home with home health services arranged, pulmonology follow up next week, to continue to on steroids in the interim.   Discharge Diagnoses: Principal Problem:   Acute hypoxemic respiratory failure (HCC) Active Problems:   Hypertension   Diabetes mellitus without complication (HCC)   PAD (peripheral artery disease) (HCC)   CAP (community acquired pneumonia)   Community acquired pneumonia   Hypoxia   Interstitial lung disease ([place])   Bandemia   Mediastinal adenopathy   Malnutrition of moderate degree   SIADH (syndrome of inappropriate ADH production) ([place])   Acute hypoxemic respiratory failure:secondary to exacerbation of underlying pulmonary fibrosis.  ? Community-acquired pneumonia:vs above (pulmonary fibrosis with exacerbation).Coronavirus test is negative x2. - Pulmonology following, appreciate recs: - continue supplemental O2 forspO2 > 88% - continue prednisone 50 mg daily until pulm follow up - Levaquin, course completed for CA-PNA - stopped lasix  -Patient currently requiring 2 L/min oxygen at rest,4-5 L/min with ambulation  Constipation-patient reports no BM since despiteColace, milk of magnesia. We will add lactulose - resolved.  Hyponatremia -secondary to SIADH.  Patient euvolemic on exam, high urine osm, low urine sodium.  Nephrology consulted.  Most likely SIADH secondary to chronic lung disease.  Sodium improved with fluid restriction. - fluid restriction - stop HCTZ -Recheck BMP tomorrow  Non-sustained V-tach(on telemetry 11/4): patient asymptomatic at the time.  - maintain K>4.0, Mg>2.0 - keep on telemetry  Mediastinal and hilar lymphadenopathy: Follow-up with pulmonologist for further recommendations.  Hypokalemia:resolved.Repeat BMP. Maintain K>4.0.  Leukocytosis:secondary to steroids. Monitor withCBC's.  Peripheral vascular disease: - continue homeaspirin  and Plavix   Hypertension:had had soft to normal blood pressure throughout admission. - HCTZ stopped due to hyponatremia - Held lisinopril, discontinue on discharge  Type II DM: Hyperglycemia secondary to steroids - improved on current regimen  Metformin, Actos and glipizide held during admission.  Titrated up insulin for progressive hyperglycemia with high dose steroids. Patient not on insulin at baseline, states he thinks he can self administer if needed after discharge.  Diabetes educator met with patient and wife and demonstrated insulin administration.  - cont short acting with mealsagain (to15units) - cont Lantus 20 units) qHS -contsliding scale insulinmoderate - discharge on sliding scale and Lantus, as CBG coming down now on lower dose oral steroid.  Close PCP follow up.    Discharge Instructions   Discharge Instructions    Call MD for:   Complete by: As directed    Worsening shortness of breath or having to increase oxygen flow to keep oxygen level above 88%.   Diet - low sodium heart healthy   Complete by: As directed    1500 cc (1.5 L) fluids per day   Discharge instructions   Complete by: As directed    Continue on insulin while taking high dose steroids.  Primary care will transition you back to oral diabetes medications when it's appropriate to do so.  If any issues controlling blood sugar (if having high's or low's), please call primary care doctor.  Sodium - please continue to restrict fluid intake to 1.5 Liter per day or less.   - need to have labs (BMP) drawn later this week to recheck sodium  Continue on Prednisone 50 mg per day until you go for pulmonology follow up next week.   Increase activity slowly   Complete by: As directed    Be very cautious with exertion and increase oxygen to 4-5 L/min when up walking. We want your oxygen level (oxygen saturation) to be at least 88%.     Allergies as of 12/04/2018   No Known Allergies     Medication  List    STOP taking these medications   amLODipine 5 MG tablet Commonly known as: NORVASC   glipiZIDE 10 MG tablet Commonly known as: GLUCOTROL   hydrochlorothiazide 25 MG tablet Commonly known as: HYDRODIURIL   Jardiance 25 MG Tabs tablet Generic drug: empagliflozin   lisinopril 40 MG tablet Commonly known as: ZESTRIL   metFORMIN 1000 MG tablet Commonly known as: GLUCOPHAGE   pioglitazone 15 MG tablet Commonly known as: ACTOS   Potassium 99 MG Tabs     TAKE these medications   antiseptic oral rinse Liqd 15 mLs by Mouth Rinse route as needed for dry mouth.   aspirin 81 MG tablet Take 81 mg by mouth daily.   brimonidine 0.2 % ophthalmic solution Commonly known as: ALPHAGAN Place 1 drop into both eyes 2 (two) times daily.   Cinnamon 500 MG capsule Take 500 mg by mouth daily.   clopidogrel 75 MG tablet Commonly known as: PLAVIX TAKE 1 TABLET BY MOUTH EVERY DAY   docusate sodium 100 MG capsule Commonly known as: COLACE Take 1 capsule (100 mg total) by mouth 2 (two) times daily.   dorzolamide 2 % ophthalmic solution Commonly known as: TRUSOPT Place 1 drop into both eyes 2 (two) times daily.   Ensure Max Protein Liqd Take 330 mLs (11 oz total) by mouth 2 (two) times daily between meals.   feeding supplement (GLUCERNA SHAKE) Liqd Take 237 mLs by mouth 3 (three) times daily between meals.   gabapentin 100 MG capsule Commonly known as: NEURONTIN Take 100 mg by mouth every morning.   insulin aspart 100 UNIT/ML injection Commonly known as: novoLOG Inject 0-15 Units into the skin 3 (three)  times daily with meals.   insulin glargine 100 UNIT/ML injection Commonly known as: LANTUS Inject 0.2 mLs (20 Units total) into the skin at bedtime.   latanoprost 0.005 % ophthalmic solution Commonly known as: XALATAN Place 1 drop into both eyes at bedtime.   Magnesium Oxide 250 MG Tabs Take 250 mg by mouth daily.   mirtazapine 15 MG tablet Commonly known as:  REMERON Take 1 tablet (15 mg total) by mouth at bedtime.   mometasone-formoterol 100-5 MCG/ACT Aero Commonly known as: DULERA Inhale 2 puffs into the lungs 2 (two) times daily.   multivitamin with minerals Tabs tablet Take 1 tablet by mouth daily.   niacin 500 MG CR capsule Take 500 mg by mouth at bedtime.   ondansetron 4 MG tablet Commonly known as: ZOFRAN Take 1 tablet (4 mg total) by mouth every 6 (six) hours as needed for nausea.   pantoprazole 40 MG tablet Commonly known as: PROTONIX Take 1 tablet (40 mg total) by mouth daily.   predniSONE 50 MG tablet Commonly known as: DELTASONE Take 1 tablet (50 mg total) by mouth daily with breakfast.   simvastatin 40 MG tablet Commonly known as: ZOCOR Take 20 mg by mouth daily at 6 PM. TAKES 0.5 TABLET   SYSTANE ULTRA OP Apply 1 drop to eye 3 (three) times daily as needed (Dry eyes).   timolol 0.5 % ophthalmic solution Commonly known as: TIMOPTIC Place 1 drop into both eyes 2 (two) times daily.     ASK your doctor about these medications   insulin starter kit- syringes Misc 1 kit by Other route once for 1 dose. Ask about: Should I take this medication?       No Known Allergies  Consultations:  Pulmonology   Procedures/Studies: Dg Chest 1 View  Result Date: 12/02/2018 CLINICAL DATA:  Shortness of breath EXAM: CHEST  1 VIEW COMPARISON:  Radiograph 12/01/2018, CT 11/25/2018 FINDINGS: Coarse interstitial changes throughout the lungs compatible with known history of pulmonary fibrosis. Lung volumes are diminished with increasing hazy opacity which could reflect atelectasis or underlying infection. Cardiomegaly is similar to comparison is. Atherosclerotic calcification of the aorta is noted. Degenerative changes are present in the imaged spine and shoulders. IMPRESSION: 1. Coarse interstitial changes throughout the lungs compatible with known history of pulmonary interstitial disease. 2. Diminishing lung volumes with  increasing hazy opacity which could reflect atelectasis or airspace disease. Electronically Signed   By: Lovena Le M.D.   On: 12/02/2018 22:32   Ct Angio Chest Pe W And/or Wo Contrast  Result Date: 11/25/2018 CLINICAL DATA:  Shortness of breath beginning 4 weeks ago, gradually worsening. EXAM: CT ANGIOGRAPHY CHEST WITH CONTRAST TECHNIQUE: Multidetector CT imaging of the chest was performed using the standard protocol during bolus administration of intravenous contrast. Multiplanar CT image reconstructions and MIPs were obtained to evaluate the vascular anatomy. CONTRAST:  57m OMNIPAQUE IOHEXOL 350 MG/ML SOLN COMPARISON:  Current chest radiograph. FINDINGS: Cardiovascular: There is satisfactory opacification of the pulmonary arteries to the segmental level. There is no evidence of a pulmonary embolism. Heart is normal in size. Three-vessel coronary artery calcifications. No pericardial effusion. Great vessels normal in caliber. No aortic dissection. Aortic atherosclerosis. Mediastinum/Nodes: 9 mm hypoattenuating inferior right thyroid lobe nodule. No neck base or axillary masses or enlarged lymph nodes. Mediastinal adenopathy. Several reference measurements are made. 17 mm short axis prevascular node. Azygos level right paratracheal node measuring 17 mm in short axis. More superior right paratracheal node measuring 14 mm in short axis  subcarinal node measuring 2.1 cm in short axis. Mildly enlarged hilar nodes, 1.6 cm short axis superior right hilar node and a 1.5 cm mid left hilar and 1.8 cm left infrahilar nodes. Trachea and esophagus are unremarkable.  Small hiatal hernia. Lungs/Pleura: Lungs demonstrate coarse heterogeneous interstitial thickening, areas of interspersed cystic change, architectural distortion and intervening areas of ground-glass opacity, the latter most prominent in the lower lobes. Cystic change appears to be underlying paraseptal and mild centrilobular emphysema. Minimal bronchiectasis.  No lung mass or suspicious nodule. No pleural effusion or pneumothorax. Upper Abdomen: No acute findings. Prominent gastrohepatic ligament lymph nodes, largest 1 point 2 cm in short axis. Musculoskeletal: No fracture or acute finding. No osteoblastic or osteolytic lesions. Review of the MIP images confirms the above findings. IMPRESSION: 1. No evidence of a pulmonary embolism. 2. Extensive findings of interstitial lung disease include a prominent component of ground-glass opacity, the latter most evident in the lower lobes. There is associated prominent mediastinal and less prominent hilar adenopathy. Mild underlying emphysema. 3. Chronic findings include three-vessel coronary artery calcifications and aortic atherosclerosis. Aortic Atherosclerosis (ICD10-I70.0) and Emphysema (ICD10-J43.9). Electronically Signed   By: Lajean Manes M.D.   On: 11/25/2018 18:24   Dg Chest Port 1 View  Result Date: 12/01/2018 CLINICAL DATA:  Former smoker, admission 11-27-18 due to acute exacerbation of 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 Aeration of the bilateral lungs appears mildly improved. There are coarse bilateral interstitial opacities likely representing underlying pulmonary fibrosis. No pneumothorax or large pleural effusion. IMPRESSION: 1. Mildly improved aeration of the bilateral lungs which may represent resolving acute infection or inflammation. 2. Bilateral interstitial opacities most likely representing underlying pulmonary fibrosis. Electronically Signed   By: Audie Pinto M.D.   On: 12/01/2018 10:46   Dg Chest Port 1 View  Result Date: 11/27/2018 CLINICAL DATA:  Acute respiratory failure EXAM: PORTABLE CHEST 1 VIEW COMPARISON:  11/25/2018, CT 11/25/2018 FINDINGS: Bilateral reticular and ground-glass opacity, consistent with chronic  interstitial lung disease and fibrosis. Emphysematous disease. Ground-glass opacity at the bases without significant change. Stable cardiomediastinal silhouette with aortic atherosclerosis. No pneumothorax IMPRESSION: No significant interval change since 11/25/2018. Extensive reticular and ground-glass opacity corresponding to interstitial lung disease and fibrosis on prior CT. There may be acute superimposed infection or inflammatory change at the lung bases. Similar appearance of cardiomegaly Electronically Signed   By: Donavan Foil M.D.   On: 11/27/2018 17:26   Dg Chest Port 1 View  Result Date: 11/25/2018 CLINICAL DATA:  Shortness of breath starting 4 weeks ago, gradually worsening. Former smoker. EXAM: PORTABLE CHEST 1 VIEW COMPARISON:  None. FINDINGS: Borderline cardiomegaly. Patchy bilateral airspace opacities, predominantly peripheral. No pleural effusion or pneumothorax seen. Osseous structures about the chest are unremarkable. IMPRESSION: 1. Borderline cardiomegaly. 2. Bilateral airspace opacities, of uncertain chronicity. Findings could represent chronic interstitial lung disease/fibrosis, acute interstitial edema or multifocal pneumonia. In the absence of fever, suspect some degree of acute edema superimposed on chronic interstitial lung disease/fibrosis. Electronically Signed   By: Franki Cabot M.D.   On: 11/25/2018 16:20      Subjective: Patient awake sitting up in bed, having breakfast.  No acute events reported overnight.  Denies worsening breathing or oxygen needs, chest pain, fever/chills.  Advised ongoing fluid restriction for his sodium, states agreement.     Discharge Exam: Vitals:   12/04/18 0832 12/04/18 1315  BP: Marland Kitchen)  100/51   Pulse: 100   Resp: 18   Temp: 98.2 F (36.8 C)   SpO2: 91% 99%   Vitals:   12/04/18 0236 12/04/18 0707 12/04/18 0832 12/04/18 1315  BP:   (!) 100/51   Pulse:   100   Resp:   18   Temp:   98.2 F (36.8 C)   TempSrc:      SpO2: 93% 95% 91%  99%  Weight:      Height:        General: Pt is alert, awake, not in acute distress Cardiovascular: RRR, S1/S2 +, no rubs, no gallops Respiratory: good air movement, diminished at bases with faint crackles, no wheezing, no rhonchi Abdominal: Soft, NT, ND, bowel sounds + Extremities: no edema, no cyanosis    The results of significant diagnostics from this hospitalization (including imaging, microbiology, ancillary and laboratory) are listed below for reference.     Microbiology: Recent Results (from the past 240 hour(s))  Respiratory Panel by PCR     Status: None   Collection Time: 11/26/18 11:59 PM   Specimen: Urine, Random; Respiratory  Result Value Ref Range Status   Adenovirus NOT DETECTED NOT DETECTED Final   Coronavirus 229E NOT DETECTED NOT DETECTED Final    Comment: (NOTE) The Coronavirus on the Respiratory Panel, DOES NOT test for the novel  Coronavirus (2019 nCoV)    Coronavirus HKU1 NOT DETECTED NOT DETECTED Final   Coronavirus NL63 NOT DETECTED NOT DETECTED Final   Coronavirus OC43 NOT DETECTED NOT DETECTED Final   Metapneumovirus NOT DETECTED NOT DETECTED Final   Rhinovirus / Enterovirus NOT DETECTED NOT DETECTED Final   Influenza A NOT DETECTED NOT DETECTED Final   Influenza B NOT DETECTED NOT DETECTED Final   Parainfluenza Virus 1 NOT DETECTED NOT DETECTED Final   Parainfluenza Virus 2 NOT DETECTED NOT DETECTED Final   Parainfluenza Virus 3 NOT DETECTED NOT DETECTED Final   Parainfluenza Virus 4 NOT DETECTED NOT DETECTED Final   Respiratory Syncytial Virus NOT DETECTED NOT DETECTED Final   Bordetella pertussis NOT DETECTED NOT DETECTED Final   Chlamydophila pneumoniae NOT DETECTED NOT DETECTED Final   Mycoplasma pneumoniae NOT DETECTED NOT DETECTED Final    Comment: Performed at [facility], [place] [address], [place], [place] [address]     Labs: BNP (last 3 results) Recent Labs    11/25/18 1527  BNP 23.3   Basic Metabolic Panel: Recent Labs   Lab 11/30/18 0431 12/01/18 0408 12/02/18 0435 12/03/18 0439 12/03/18 1330 12/04/18 0439  NA 131* 129* 127* 125* 124* 128*  K 4.1 3.9 5.0 4.7 4.5 3.9  CL 94* 90* 88* 86* 89* 94*  CO2 _0 GLUCOSE 222* 232* 262* 128* 127* 79  BUN 68* 67* 83* 95* 72* 43*  CREATININE 1.00 0.99 1.02 1.21 0.81 0.67  CALCIUM 9.3 9.6 9.7 9.6 8.7* 8.7*  MG 2.7*  --   --  3.2*  --  2.1   Liver Function Tests: No results for input(s): AST, ALT, ALKPHOS, BILITOT, PROT, ALBUMIN in the last 168 hours. No results for input(s): LIPASE, AMYLASE in the last 168 hours. No results for input(s): AMMONIA in the last 168 hours. CBC: Recent Labs  Lab 11/30/18 0431 12/01/18 0408 12/02/18 0435 12/03/18 0439 12/04/18 0439  WBC 19.6* 20.4* 28.2* 27.2* 25.9*  NEUTROABS 15.5* 16.6* 22.3* 21.5* 18.4*  HGB 14.5 15.2 14.9 13.3 12.4*  HCT 45.1 46.1 44.5 38.6* 36.7*  MCV 83.2 82.6  81.1 80.1 80.8  PLT 537* 537* 569* 467* 494*   Cardiac Enzymes: No results for input(s): CKTOTAL, CKMB, CKMBINDEX, TROPONINI in the last 168 hours. BNP: Invalid input(s): POCBNP CBG: Recent Labs  Lab 12/03/18 1203 12/03/18 1632 12/03/18 2127 12/04/18 0807 12/04/18 1136  GLUCAP 103* 154* 61* 80 151*   D-Dimer No results for input(s): DDIMER in the last 72 hours. Hgb A1c No results for input(s): HGBA1C in the last 72 hours. Lipid Profile No results for input(s): CHOL, HDL, LDLCALC, TRIG, CHOLHDL, LDLDIRECT in the last 72 hours. Thyroid function studies No results for input(s): TSH, T4TOTAL, T3FREE, THYROIDAB in the last 72 hours.  Invalid input(s): FREET3 Anemia work up No results for input(s): VITAMINB12, FOLATE, FERRITIN, TIBC, IRON, RETICCTPCT in the last 72 hours. Urinalysis No results found for: COLORURINE, APPEARANCEUR, [place], [place], [place], [place], [place], [place], PROTEINUR, UROBILINOGEN, NITRITE, LEUKOCYTESUR Sepsis Labs Invalid input(s): PROCALCITONIN,  WBC,   LACTICIDVEN Microbiology Recent Results (from the past 240 hour(s))  Respiratory Panel by PCR     Status: None   Collection Time: 11/26/18 11:59 PM   Specimen: Urine, Random; Respiratory  Result Value Ref Range Status   Adenovirus NOT DETECTED NOT DETECTED Final   Coronavirus 229E NOT DETECTED NOT DETECTED Final    Comment: (NOTE) The Coronavirus on the Respiratory Panel, DOES NOT test for the novel  Coronavirus (2019 nCoV)    Coronavirus HKU1 NOT DETECTED NOT DETECTED Final   Coronavirus NL63 NOT DETECTED NOT DETECTED Final   Coronavirus OC43 NOT DETECTED NOT DETECTED Final   Metapneumovirus NOT DETECTED NOT DETECTED Final   Rhinovirus / Enterovirus NOT DETECTED NOT DETECTED Final   Influenza A NOT DETECTED NOT DETECTED Final   Influenza B NOT DETECTED NOT DETECTED Final   Parainfluenza Virus 1 NOT DETECTED NOT DETECTED Final   Parainfluenza Virus 2 NOT DETECTED NOT DETECTED Final   Parainfluenza Virus 3 NOT DETECTED NOT DETECTED Final   Parainfluenza Virus 4 NOT DETECTED NOT DETECTED Final   Respiratory Syncytial Virus NOT DETECTED NOT DETECTED Final   Bordetella pertussis NOT DETECTED NOT DETECTED Final   Chlamydophila pneumoniae NOT DETECTED NOT DETECTED Final   Mycoplasma pneumoniae NOT DETECTED NOT DETECTED Final    Comment: Performed at [facility], [place] [address], [place], [place] [address]     Time coordinating discharge: Over 30 minutes  SIGNED:   Ezekiel Slocumb, DO [facility] 12/06/2018, 11:35 AM Pager [phone]  If 7PM-7AM, please contact night-coverage [email or web address] Password [email or web address]

## 2018-12-07 DIAGNOSIS — K219 Gastro-esophageal reflux disease without esophagitis: Secondary | ICD-10-CM | POA: Diagnosis not present

## 2018-12-07 DIAGNOSIS — E785 Hyperlipidemia, unspecified: Secondary | ICD-10-CM | POA: Diagnosis not present

## 2018-12-07 DIAGNOSIS — I10 Essential (primary) hypertension: Secondary | ICD-10-CM | POA: Diagnosis not present

## 2018-12-07 DIAGNOSIS — M47817 Spondylosis without myelopathy or radiculopathy, lumbosacral region: Secondary | ICD-10-CM | POA: Diagnosis not present

## 2018-12-07 DIAGNOSIS — H409 Unspecified glaucoma: Secondary | ICD-10-CM | POA: Diagnosis not present

## 2018-12-07 DIAGNOSIS — E1151 Type 2 diabetes mellitus with diabetic peripheral angiopathy without gangrene: Secondary | ICD-10-CM | POA: Diagnosis not present

## 2018-12-07 DIAGNOSIS — Z9981 Dependence on supplemental oxygen: Secondary | ICD-10-CM | POA: Diagnosis not present

## 2018-12-07 DIAGNOSIS — I2510 Atherosclerotic heart disease of native coronary artery without angina pectoris: Secondary | ICD-10-CM | POA: Diagnosis not present

## 2018-12-07 DIAGNOSIS — J439 Emphysema, unspecified: Secondary | ICD-10-CM | POA: Diagnosis not present

## 2018-12-10 DIAGNOSIS — E1151 Type 2 diabetes mellitus with diabetic peripheral angiopathy without gangrene: Secondary | ICD-10-CM | POA: Diagnosis not present

## 2018-12-10 DIAGNOSIS — E1165 Type 2 diabetes mellitus with hyperglycemia: Secondary | ICD-10-CM | POA: Diagnosis not present

## 2018-12-10 DIAGNOSIS — Z87891 Personal history of nicotine dependence: Secondary | ICD-10-CM | POA: Diagnosis not present

## 2018-12-10 DIAGNOSIS — I70219 Atherosclerosis of native arteries of extremities with intermittent claudication, unspecified extremity: Secondary | ICD-10-CM | POA: Diagnosis not present

## 2018-12-10 DIAGNOSIS — Z794 Long term (current) use of insulin: Secondary | ICD-10-CM | POA: Diagnosis not present

## 2018-12-10 DIAGNOSIS — J849 Interstitial pulmonary disease, unspecified: Secondary | ICD-10-CM | POA: Diagnosis not present

## 2018-12-11 DIAGNOSIS — K219 Gastro-esophageal reflux disease without esophagitis: Secondary | ICD-10-CM | POA: Diagnosis not present

## 2018-12-11 DIAGNOSIS — I2510 Atherosclerotic heart disease of native coronary artery without angina pectoris: Secondary | ICD-10-CM | POA: Diagnosis not present

## 2018-12-11 DIAGNOSIS — J439 Emphysema, unspecified: Secondary | ICD-10-CM | POA: Diagnosis not present

## 2018-12-11 DIAGNOSIS — E1151 Type 2 diabetes mellitus with diabetic peripheral angiopathy without gangrene: Secondary | ICD-10-CM | POA: Diagnosis not present

## 2018-12-11 DIAGNOSIS — E785 Hyperlipidemia, unspecified: Secondary | ICD-10-CM | POA: Diagnosis not present

## 2018-12-11 DIAGNOSIS — Z9981 Dependence on supplemental oxygen: Secondary | ICD-10-CM | POA: Diagnosis not present

## 2018-12-11 DIAGNOSIS — I10 Essential (primary) hypertension: Secondary | ICD-10-CM | POA: Diagnosis not present

## 2018-12-11 DIAGNOSIS — M47817 Spondylosis without myelopathy or radiculopathy, lumbosacral region: Secondary | ICD-10-CM | POA: Diagnosis not present

## 2018-12-11 DIAGNOSIS — H409 Unspecified glaucoma: Secondary | ICD-10-CM | POA: Diagnosis not present

## 2018-12-12 DIAGNOSIS — J189 Pneumonia, unspecified organism: Secondary | ICD-10-CM | POA: Diagnosis not present

## 2018-12-12 DIAGNOSIS — J690 Pneumonitis due to inhalation of food and vomit: Secondary | ICD-10-CM | POA: Diagnosis not present

## 2018-12-13 DIAGNOSIS — K219 Gastro-esophageal reflux disease without esophagitis: Secondary | ICD-10-CM | POA: Diagnosis not present

## 2018-12-13 DIAGNOSIS — I10 Essential (primary) hypertension: Secondary | ICD-10-CM | POA: Diagnosis not present

## 2018-12-13 DIAGNOSIS — E1151 Type 2 diabetes mellitus with diabetic peripheral angiopathy without gangrene: Secondary | ICD-10-CM | POA: Diagnosis not present

## 2018-12-13 DIAGNOSIS — E785 Hyperlipidemia, unspecified: Secondary | ICD-10-CM | POA: Diagnosis not present

## 2018-12-13 DIAGNOSIS — H409 Unspecified glaucoma: Secondary | ICD-10-CM | POA: Diagnosis not present

## 2018-12-13 DIAGNOSIS — I2510 Atherosclerotic heart disease of native coronary artery without angina pectoris: Secondary | ICD-10-CM | POA: Diagnosis not present

## 2018-12-13 DIAGNOSIS — Z9981 Dependence on supplemental oxygen: Secondary | ICD-10-CM | POA: Diagnosis not present

## 2018-12-13 DIAGNOSIS — J439 Emphysema, unspecified: Secondary | ICD-10-CM | POA: Diagnosis not present

## 2018-12-13 DIAGNOSIS — M47817 Spondylosis without myelopathy or radiculopathy, lumbosacral region: Secondary | ICD-10-CM | POA: Diagnosis not present

## 2018-12-14 DIAGNOSIS — E1151 Type 2 diabetes mellitus with diabetic peripheral angiopathy without gangrene: Secondary | ICD-10-CM | POA: Diagnosis not present

## 2018-12-14 DIAGNOSIS — M47817 Spondylosis without myelopathy or radiculopathy, lumbosacral region: Secondary | ICD-10-CM | POA: Diagnosis not present

## 2018-12-14 DIAGNOSIS — K219 Gastro-esophageal reflux disease without esophagitis: Secondary | ICD-10-CM | POA: Diagnosis not present

## 2018-12-14 DIAGNOSIS — I10 Essential (primary) hypertension: Secondary | ICD-10-CM | POA: Diagnosis not present

## 2018-12-14 DIAGNOSIS — I2510 Atherosclerotic heart disease of native coronary artery without angina pectoris: Secondary | ICD-10-CM | POA: Diagnosis not present

## 2018-12-14 DIAGNOSIS — J439 Emphysema, unspecified: Secondary | ICD-10-CM | POA: Diagnosis not present

## 2018-12-14 DIAGNOSIS — Z9981 Dependence on supplemental oxygen: Secondary | ICD-10-CM | POA: Diagnosis not present

## 2018-12-14 DIAGNOSIS — E785 Hyperlipidemia, unspecified: Secondary | ICD-10-CM | POA: Diagnosis not present

## 2018-12-14 DIAGNOSIS — H409 Unspecified glaucoma: Secondary | ICD-10-CM | POA: Diagnosis not present

## 2018-12-17 DIAGNOSIS — E1151 Type 2 diabetes mellitus with diabetic peripheral angiopathy without gangrene: Secondary | ICD-10-CM | POA: Diagnosis not present

## 2018-12-17 DIAGNOSIS — E785 Hyperlipidemia, unspecified: Secondary | ICD-10-CM | POA: Diagnosis not present

## 2018-12-17 DIAGNOSIS — J439 Emphysema, unspecified: Secondary | ICD-10-CM | POA: Diagnosis not present

## 2018-12-17 DIAGNOSIS — H409 Unspecified glaucoma: Secondary | ICD-10-CM | POA: Diagnosis not present

## 2018-12-17 DIAGNOSIS — I2510 Atherosclerotic heart disease of native coronary artery without angina pectoris: Secondary | ICD-10-CM | POA: Diagnosis not present

## 2018-12-17 DIAGNOSIS — Z9981 Dependence on supplemental oxygen: Secondary | ICD-10-CM | POA: Diagnosis not present

## 2018-12-17 DIAGNOSIS — M47817 Spondylosis without myelopathy or radiculopathy, lumbosacral region: Secondary | ICD-10-CM | POA: Diagnosis not present

## 2018-12-17 DIAGNOSIS — J449 Chronic obstructive pulmonary disease, unspecified: Secondary | ICD-10-CM | POA: Diagnosis not present

## 2018-12-17 DIAGNOSIS — I10 Essential (primary) hypertension: Secondary | ICD-10-CM | POA: Diagnosis not present

## 2018-12-17 DIAGNOSIS — K219 Gastro-esophageal reflux disease without esophagitis: Secondary | ICD-10-CM | POA: Diagnosis not present

## 2018-12-18 DIAGNOSIS — Z9981 Dependence on supplemental oxygen: Secondary | ICD-10-CM | POA: Diagnosis not present

## 2018-12-18 DIAGNOSIS — M47817 Spondylosis without myelopathy or radiculopathy, lumbosacral region: Secondary | ICD-10-CM | POA: Diagnosis not present

## 2018-12-18 DIAGNOSIS — K219 Gastro-esophageal reflux disease without esophagitis: Secondary | ICD-10-CM | POA: Diagnosis not present

## 2018-12-18 DIAGNOSIS — I10 Essential (primary) hypertension: Secondary | ICD-10-CM | POA: Diagnosis not present

## 2018-12-18 DIAGNOSIS — I2510 Atherosclerotic heart disease of native coronary artery without angina pectoris: Secondary | ICD-10-CM | POA: Diagnosis not present

## 2018-12-18 DIAGNOSIS — I70219 Atherosclerosis of native arteries of extremities with intermittent claudication, unspecified extremity: Secondary | ICD-10-CM | POA: Diagnosis not present

## 2018-12-18 DIAGNOSIS — E1151 Type 2 diabetes mellitus with diabetic peripheral angiopathy without gangrene: Secondary | ICD-10-CM | POA: Diagnosis not present

## 2018-12-18 DIAGNOSIS — H409 Unspecified glaucoma: Secondary | ICD-10-CM | POA: Diagnosis not present

## 2018-12-18 DIAGNOSIS — J439 Emphysema, unspecified: Secondary | ICD-10-CM | POA: Diagnosis not present

## 2018-12-18 DIAGNOSIS — E785 Hyperlipidemia, unspecified: Secondary | ICD-10-CM | POA: Diagnosis not present

## 2018-12-19 DIAGNOSIS — H409 Unspecified glaucoma: Secondary | ICD-10-CM | POA: Diagnosis not present

## 2018-12-19 DIAGNOSIS — J439 Emphysema, unspecified: Secondary | ICD-10-CM | POA: Diagnosis not present

## 2018-12-19 DIAGNOSIS — E1151 Type 2 diabetes mellitus with diabetic peripheral angiopathy without gangrene: Secondary | ICD-10-CM | POA: Diagnosis not present

## 2018-12-19 DIAGNOSIS — K219 Gastro-esophageal reflux disease without esophagitis: Secondary | ICD-10-CM | POA: Diagnosis not present

## 2018-12-19 DIAGNOSIS — I2510 Atherosclerotic heart disease of native coronary artery without angina pectoris: Secondary | ICD-10-CM | POA: Diagnosis not present

## 2018-12-19 DIAGNOSIS — M47817 Spondylosis without myelopathy or radiculopathy, lumbosacral region: Secondary | ICD-10-CM | POA: Diagnosis not present

## 2018-12-19 DIAGNOSIS — E785 Hyperlipidemia, unspecified: Secondary | ICD-10-CM | POA: Diagnosis not present

## 2018-12-19 DIAGNOSIS — Z9981 Dependence on supplemental oxygen: Secondary | ICD-10-CM | POA: Diagnosis not present

## 2018-12-19 DIAGNOSIS — I10 Essential (primary) hypertension: Secondary | ICD-10-CM | POA: Diagnosis not present

## 2018-12-25 DIAGNOSIS — M79604 Pain in right leg: Secondary | ICD-10-CM | POA: Diagnosis not present

## 2018-12-25 DIAGNOSIS — L568 Other specified acute skin changes due to ultraviolet radiation: Secondary | ICD-10-CM | POA: Diagnosis not present

## 2018-12-25 DIAGNOSIS — R600 Localized edema: Secondary | ICD-10-CM | POA: Diagnosis not present

## 2018-12-25 DIAGNOSIS — K219 Gastro-esophageal reflux disease without esophagitis: Secondary | ICD-10-CM | POA: Diagnosis not present

## 2018-12-25 DIAGNOSIS — E785 Hyperlipidemia, unspecified: Secondary | ICD-10-CM | POA: Diagnosis not present

## 2018-12-25 DIAGNOSIS — I2510 Atherosclerotic heart disease of native coronary artery without angina pectoris: Secondary | ICD-10-CM | POA: Diagnosis not present

## 2018-12-25 DIAGNOSIS — E1151 Type 2 diabetes mellitus with diabetic peripheral angiopathy without gangrene: Secondary | ICD-10-CM | POA: Diagnosis not present

## 2018-12-25 DIAGNOSIS — I10 Essential (primary) hypertension: Secondary | ICD-10-CM | POA: Diagnosis not present

## 2018-12-25 DIAGNOSIS — M47817 Spondylosis without myelopathy or radiculopathy, lumbosacral region: Secondary | ICD-10-CM | POA: Diagnosis not present

## 2018-12-25 DIAGNOSIS — J849 Interstitial pulmonary disease, unspecified: Secondary | ICD-10-CM | POA: Diagnosis not present

## 2018-12-25 DIAGNOSIS — D649 Anemia, unspecified: Secondary | ICD-10-CM | POA: Diagnosis not present

## 2018-12-25 DIAGNOSIS — I70219 Atherosclerosis of native arteries of extremities with intermittent claudication, unspecified extremity: Secondary | ICD-10-CM | POA: Diagnosis not present

## 2018-12-25 DIAGNOSIS — Z9981 Dependence on supplemental oxygen: Secondary | ICD-10-CM | POA: Diagnosis not present

## 2018-12-25 DIAGNOSIS — M79605 Pain in left leg: Secondary | ICD-10-CM | POA: Diagnosis not present

## 2018-12-25 DIAGNOSIS — H409 Unspecified glaucoma: Secondary | ICD-10-CM | POA: Diagnosis not present

## 2018-12-25 DIAGNOSIS — J439 Emphysema, unspecified: Secondary | ICD-10-CM | POA: Diagnosis not present

## 2018-12-26 DIAGNOSIS — H409 Unspecified glaucoma: Secondary | ICD-10-CM | POA: Diagnosis not present

## 2018-12-26 DIAGNOSIS — I10 Essential (primary) hypertension: Secondary | ICD-10-CM | POA: Diagnosis not present

## 2018-12-26 DIAGNOSIS — I2510 Atherosclerotic heart disease of native coronary artery without angina pectoris: Secondary | ICD-10-CM | POA: Diagnosis not present

## 2018-12-26 DIAGNOSIS — K219 Gastro-esophageal reflux disease without esophagitis: Secondary | ICD-10-CM | POA: Diagnosis not present

## 2018-12-26 DIAGNOSIS — E785 Hyperlipidemia, unspecified: Secondary | ICD-10-CM | POA: Diagnosis not present

## 2018-12-26 DIAGNOSIS — J439 Emphysema, unspecified: Secondary | ICD-10-CM | POA: Diagnosis not present

## 2018-12-26 DIAGNOSIS — M47817 Spondylosis without myelopathy or radiculopathy, lumbosacral region: Secondary | ICD-10-CM | POA: Diagnosis not present

## 2018-12-26 DIAGNOSIS — Z9981 Dependence on supplemental oxygen: Secondary | ICD-10-CM | POA: Diagnosis not present

## 2018-12-26 DIAGNOSIS — E1151 Type 2 diabetes mellitus with diabetic peripheral angiopathy without gangrene: Secondary | ICD-10-CM | POA: Diagnosis not present

## 2018-12-28 DIAGNOSIS — K219 Gastro-esophageal reflux disease without esophagitis: Secondary | ICD-10-CM | POA: Diagnosis not present

## 2018-12-28 DIAGNOSIS — E785 Hyperlipidemia, unspecified: Secondary | ICD-10-CM | POA: Diagnosis not present

## 2018-12-28 DIAGNOSIS — E1151 Type 2 diabetes mellitus with diabetic peripheral angiopathy without gangrene: Secondary | ICD-10-CM | POA: Diagnosis not present

## 2018-12-28 DIAGNOSIS — I10 Essential (primary) hypertension: Secondary | ICD-10-CM | POA: Diagnosis not present

## 2018-12-28 DIAGNOSIS — H409 Unspecified glaucoma: Secondary | ICD-10-CM | POA: Diagnosis not present

## 2018-12-28 DIAGNOSIS — Z9981 Dependence on supplemental oxygen: Secondary | ICD-10-CM | POA: Diagnosis not present

## 2018-12-28 DIAGNOSIS — J439 Emphysema, unspecified: Secondary | ICD-10-CM | POA: Diagnosis not present

## 2018-12-28 DIAGNOSIS — M47817 Spondylosis without myelopathy or radiculopathy, lumbosacral region: Secondary | ICD-10-CM | POA: Diagnosis not present

## 2018-12-28 DIAGNOSIS — I2510 Atherosclerotic heart disease of native coronary artery without angina pectoris: Secondary | ICD-10-CM | POA: Diagnosis not present

## 2018-12-31 DIAGNOSIS — J439 Emphysema, unspecified: Secondary | ICD-10-CM | POA: Diagnosis not present

## 2018-12-31 DIAGNOSIS — H409 Unspecified glaucoma: Secondary | ICD-10-CM | POA: Diagnosis not present

## 2018-12-31 DIAGNOSIS — I10 Essential (primary) hypertension: Secondary | ICD-10-CM | POA: Diagnosis not present

## 2018-12-31 DIAGNOSIS — M47817 Spondylosis without myelopathy or radiculopathy, lumbosacral region: Secondary | ICD-10-CM | POA: Diagnosis not present

## 2018-12-31 DIAGNOSIS — E1151 Type 2 diabetes mellitus with diabetic peripheral angiopathy without gangrene: Secondary | ICD-10-CM | POA: Diagnosis not present

## 2018-12-31 DIAGNOSIS — K219 Gastro-esophageal reflux disease without esophagitis: Secondary | ICD-10-CM | POA: Diagnosis not present

## 2018-12-31 DIAGNOSIS — E785 Hyperlipidemia, unspecified: Secondary | ICD-10-CM | POA: Diagnosis not present

## 2018-12-31 DIAGNOSIS — I2510 Atherosclerotic heart disease of native coronary artery without angina pectoris: Secondary | ICD-10-CM | POA: Diagnosis not present

## 2018-12-31 DIAGNOSIS — Z9981 Dependence on supplemental oxygen: Secondary | ICD-10-CM | POA: Diagnosis not present

## 2019-01-02 DIAGNOSIS — E1151 Type 2 diabetes mellitus with diabetic peripheral angiopathy without gangrene: Secondary | ICD-10-CM | POA: Diagnosis not present

## 2019-01-02 DIAGNOSIS — E785 Hyperlipidemia, unspecified: Secondary | ICD-10-CM | POA: Diagnosis not present

## 2019-01-02 DIAGNOSIS — K219 Gastro-esophageal reflux disease without esophagitis: Secondary | ICD-10-CM | POA: Diagnosis not present

## 2019-01-02 DIAGNOSIS — Z9981 Dependence on supplemental oxygen: Secondary | ICD-10-CM | POA: Diagnosis not present

## 2019-01-02 DIAGNOSIS — I2510 Atherosclerotic heart disease of native coronary artery without angina pectoris: Secondary | ICD-10-CM | POA: Diagnosis not present

## 2019-01-02 DIAGNOSIS — I10 Essential (primary) hypertension: Secondary | ICD-10-CM | POA: Diagnosis not present

## 2019-01-02 DIAGNOSIS — J439 Emphysema, unspecified: Secondary | ICD-10-CM | POA: Diagnosis not present

## 2019-01-02 DIAGNOSIS — H409 Unspecified glaucoma: Secondary | ICD-10-CM | POA: Diagnosis not present

## 2019-01-02 DIAGNOSIS — M47817 Spondylosis without myelopathy or radiculopathy, lumbosacral region: Secondary | ICD-10-CM | POA: Diagnosis not present

## 2019-01-03 DIAGNOSIS — E1151 Type 2 diabetes mellitus with diabetic peripheral angiopathy without gangrene: Secondary | ICD-10-CM | POA: Diagnosis not present

## 2019-01-03 DIAGNOSIS — M47817 Spondylosis without myelopathy or radiculopathy, lumbosacral region: Secondary | ICD-10-CM | POA: Diagnosis not present

## 2019-01-03 DIAGNOSIS — E785 Hyperlipidemia, unspecified: Secondary | ICD-10-CM | POA: Diagnosis not present

## 2019-01-03 DIAGNOSIS — H409 Unspecified glaucoma: Secondary | ICD-10-CM | POA: Diagnosis not present

## 2019-01-03 DIAGNOSIS — I2510 Atherosclerotic heart disease of native coronary artery without angina pectoris: Secondary | ICD-10-CM | POA: Diagnosis not present

## 2019-01-03 DIAGNOSIS — Z9981 Dependence on supplemental oxygen: Secondary | ICD-10-CM | POA: Diagnosis not present

## 2019-01-03 DIAGNOSIS — K219 Gastro-esophageal reflux disease without esophagitis: Secondary | ICD-10-CM | POA: Diagnosis not present

## 2019-01-03 DIAGNOSIS — J439 Emphysema, unspecified: Secondary | ICD-10-CM | POA: Diagnosis not present

## 2019-01-03 DIAGNOSIS — I10 Essential (primary) hypertension: Secondary | ICD-10-CM | POA: Diagnosis not present

## 2019-01-08 DIAGNOSIS — I10 Essential (primary) hypertension: Secondary | ICD-10-CM | POA: Diagnosis not present

## 2019-01-08 DIAGNOSIS — Z87891 Personal history of nicotine dependence: Secondary | ICD-10-CM | POA: Diagnosis not present

## 2019-01-08 DIAGNOSIS — E1151 Type 2 diabetes mellitus with diabetic peripheral angiopathy without gangrene: Secondary | ICD-10-CM | POA: Diagnosis not present

## 2019-01-08 DIAGNOSIS — E1140 Type 2 diabetes mellitus with diabetic neuropathy, unspecified: Secondary | ICD-10-CM | POA: Diagnosis not present

## 2019-01-08 DIAGNOSIS — D649 Anemia, unspecified: Secondary | ICD-10-CM | POA: Diagnosis not present

## 2019-01-08 DIAGNOSIS — I70219 Atherosclerosis of native arteries of extremities with intermittent claudication, unspecified extremity: Secondary | ICD-10-CM | POA: Diagnosis not present

## 2019-01-08 DIAGNOSIS — J849 Interstitial pulmonary disease, unspecified: Secondary | ICD-10-CM | POA: Diagnosis not present

## 2019-01-08 DIAGNOSIS — E118 Type 2 diabetes mellitus with unspecified complications: Secondary | ICD-10-CM | POA: Diagnosis not present

## 2019-01-09 DIAGNOSIS — J849 Interstitial pulmonary disease, unspecified: Secondary | ICD-10-CM | POA: Diagnosis not present

## 2019-01-16 DIAGNOSIS — E785 Hyperlipidemia, unspecified: Secondary | ICD-10-CM | POA: Diagnosis not present

## 2019-01-16 DIAGNOSIS — Z9981 Dependence on supplemental oxygen: Secondary | ICD-10-CM | POA: Diagnosis not present

## 2019-01-16 DIAGNOSIS — E1151 Type 2 diabetes mellitus with diabetic peripheral angiopathy without gangrene: Secondary | ICD-10-CM | POA: Diagnosis not present

## 2019-01-16 DIAGNOSIS — M47817 Spondylosis without myelopathy or radiculopathy, lumbosacral region: Secondary | ICD-10-CM | POA: Diagnosis not present

## 2019-01-16 DIAGNOSIS — I10 Essential (primary) hypertension: Secondary | ICD-10-CM | POA: Diagnosis not present

## 2019-01-16 DIAGNOSIS — I2510 Atherosclerotic heart disease of native coronary artery without angina pectoris: Secondary | ICD-10-CM | POA: Diagnosis not present

## 2019-01-16 DIAGNOSIS — K219 Gastro-esophageal reflux disease without esophagitis: Secondary | ICD-10-CM | POA: Diagnosis not present

## 2019-01-16 DIAGNOSIS — H409 Unspecified glaucoma: Secondary | ICD-10-CM | POA: Diagnosis not present

## 2019-01-16 DIAGNOSIS — J439 Emphysema, unspecified: Secondary | ICD-10-CM | POA: Diagnosis not present

## 2019-02-01 DIAGNOSIS — I2510 Atherosclerotic heart disease of native coronary artery without angina pectoris: Secondary | ICD-10-CM | POA: Diagnosis not present

## 2019-02-01 DIAGNOSIS — Z9981 Dependence on supplemental oxygen: Secondary | ICD-10-CM | POA: Diagnosis not present

## 2019-02-01 DIAGNOSIS — J439 Emphysema, unspecified: Secondary | ICD-10-CM | POA: Diagnosis not present

## 2019-02-01 DIAGNOSIS — I10 Essential (primary) hypertension: Secondary | ICD-10-CM | POA: Diagnosis not present

## 2019-02-01 DIAGNOSIS — H409 Unspecified glaucoma: Secondary | ICD-10-CM | POA: Diagnosis not present

## 2019-02-01 DIAGNOSIS — E1151 Type 2 diabetes mellitus with diabetic peripheral angiopathy without gangrene: Secondary | ICD-10-CM | POA: Diagnosis not present

## 2019-02-01 DIAGNOSIS — E785 Hyperlipidemia, unspecified: Secondary | ICD-10-CM | POA: Diagnosis not present

## 2019-02-01 DIAGNOSIS — M47817 Spondylosis without myelopathy or radiculopathy, lumbosacral region: Secondary | ICD-10-CM | POA: Diagnosis not present

## 2019-02-01 DIAGNOSIS — K219 Gastro-esophageal reflux disease without esophagitis: Secondary | ICD-10-CM | POA: Diagnosis not present

## 2019-03-08 DIAGNOSIS — E7849 Other hyperlipidemia: Secondary | ICD-10-CM | POA: Diagnosis not present

## 2019-03-08 DIAGNOSIS — D649 Anemia, unspecified: Secondary | ICD-10-CM | POA: Diagnosis not present

## 2019-03-08 DIAGNOSIS — I10 Essential (primary) hypertension: Secondary | ICD-10-CM | POA: Diagnosis not present

## 2019-03-08 DIAGNOSIS — E118 Type 2 diabetes mellitus with unspecified complications: Secondary | ICD-10-CM | POA: Diagnosis not present

## 2019-03-12 DIAGNOSIS — C4441 Basal cell carcinoma of skin of scalp and neck: Secondary | ICD-10-CM | POA: Diagnosis not present

## 2019-03-12 DIAGNOSIS — Z859 Personal history of malignant neoplasm, unspecified: Secondary | ICD-10-CM | POA: Diagnosis not present

## 2019-03-12 DIAGNOSIS — C44319 Basal cell carcinoma of skin of other parts of face: Secondary | ICD-10-CM | POA: Diagnosis not present

## 2019-03-12 DIAGNOSIS — D485 Neoplasm of uncertain behavior of skin: Secondary | ICD-10-CM | POA: Diagnosis not present

## 2019-03-12 DIAGNOSIS — L570 Actinic keratosis: Secondary | ICD-10-CM | POA: Diagnosis not present

## 2019-03-15 DIAGNOSIS — I10 Essential (primary) hypertension: Secondary | ICD-10-CM | POA: Diagnosis not present

## 2019-03-15 DIAGNOSIS — E1151 Type 2 diabetes mellitus with diabetic peripheral angiopathy without gangrene: Secondary | ICD-10-CM | POA: Diagnosis not present

## 2019-03-15 DIAGNOSIS — D649 Anemia, unspecified: Secondary | ICD-10-CM | POA: Diagnosis not present

## 2019-03-15 DIAGNOSIS — M79604 Pain in right leg: Secondary | ICD-10-CM | POA: Diagnosis not present

## 2019-03-15 DIAGNOSIS — I70219 Atherosclerosis of native arteries of extremities with intermittent claudication, unspecified extremity: Secondary | ICD-10-CM | POA: Diagnosis not present

## 2019-03-15 DIAGNOSIS — Z7951 Long term (current) use of inhaled steroids: Secondary | ICD-10-CM | POA: Diagnosis not present

## 2019-03-15 DIAGNOSIS — J849 Interstitial pulmonary disease, unspecified: Secondary | ICD-10-CM | POA: Diagnosis not present

## 2019-03-15 DIAGNOSIS — E7849 Other hyperlipidemia: Secondary | ICD-10-CM | POA: Diagnosis not present

## 2019-03-15 DIAGNOSIS — M79605 Pain in left leg: Secondary | ICD-10-CM | POA: Diagnosis not present

## 2019-04-02 DIAGNOSIS — C4441 Basal cell carcinoma of skin of scalp and neck: Secondary | ICD-10-CM | POA: Diagnosis not present

## 2019-04-02 DIAGNOSIS — C44319 Basal cell carcinoma of skin of other parts of face: Secondary | ICD-10-CM | POA: Diagnosis not present

## 2019-09-25 DIAGNOSIS — Z862 Personal history of diseases of the blood and blood-forming organs and certain disorders involving the immune mechanism: Secondary | ICD-10-CM | POA: Diagnosis not present

## 2019-09-25 DIAGNOSIS — Z86010 Personal history of colonic polyps: Secondary | ICD-10-CM | POA: Diagnosis not present

## 2019-09-27 ENCOUNTER — Other Ambulatory Visit
Admission: RE | Admit: 2019-09-27 | Discharge: 2019-09-27 | Disposition: A | Discharge status: Home / Self Care | Payer: Medicare HMO | Source: Ambulatory Visit | Attending: Gastroenterology | Admitting: Gastroenterology

## 2019-09-27 ENCOUNTER — Other Ambulatory Visit: Payer: Self-pay

## 2019-09-27 ENCOUNTER — Encounter: Payer: Self-pay | Admitting: *Deleted

## 2019-09-27 DIAGNOSIS — Z01812 Encounter for preprocedural laboratory examination: Secondary | ICD-10-CM | POA: Insufficient documentation

## 2019-09-27 DIAGNOSIS — Z20822 Contact with and (suspected) exposure to covid-19: Secondary | ICD-10-CM | POA: Diagnosis not present

## 2019-09-27 LAB — SARS CORONAVIRUS 2 (TAT 6-24 HRS): SARS Coronavirus 2: NEGATIVE

## 2019-10-01 ENCOUNTER — Other Ambulatory Visit: Payer: Self-pay

## 2019-10-01 ENCOUNTER — Ambulatory Visit
Admission: RE | Admit: 2019-10-01 | Discharge: 2019-10-01 | Disposition: A | Discharge status: Home / Self Care | Payer: Medicare HMO | Attending: Gastroenterology | Admitting: Gastroenterology

## 2019-10-01 ENCOUNTER — Ambulatory Visit: Payer: Medicare HMO | Admitting: Anesthesiology

## 2019-10-01 ENCOUNTER — Encounter
Admission: RE | Disposition: A | Discharge status: Home / Self Care | Payer: Self-pay | Source: Home / Self Care | Attending: Gastroenterology

## 2019-10-01 ENCOUNTER — Encounter: Payer: Self-pay | Admitting: *Deleted

## 2019-10-01 DIAGNOSIS — D509 Iron deficiency anemia, unspecified: Secondary | ICD-10-CM | POA: Insufficient documentation

## 2019-10-01 DIAGNOSIS — D120 Benign neoplasm of cecum: Secondary | ICD-10-CM | POA: Insufficient documentation

## 2019-10-01 DIAGNOSIS — K449 Diaphragmatic hernia without obstruction or gangrene: Secondary | ICD-10-CM | POA: Insufficient documentation

## 2019-10-01 DIAGNOSIS — H409 Unspecified glaucoma: Secondary | ICD-10-CM | POA: Insufficient documentation

## 2019-10-01 DIAGNOSIS — E1151 Type 2 diabetes mellitus with diabetic peripheral angiopathy without gangrene: Secondary | ICD-10-CM | POA: Diagnosis not present

## 2019-10-01 DIAGNOSIS — J45909 Unspecified asthma, uncomplicated: Secondary | ICD-10-CM | POA: Insufficient documentation

## 2019-10-01 DIAGNOSIS — Z7982 Long term (current) use of aspirin: Secondary | ICD-10-CM | POA: Insufficient documentation

## 2019-10-01 DIAGNOSIS — M1990 Unspecified osteoarthritis, unspecified site: Secondary | ICD-10-CM | POA: Insufficient documentation

## 2019-10-01 DIAGNOSIS — Z7984 Long term (current) use of oral hypoglycemic drugs: Secondary | ICD-10-CM | POA: Insufficient documentation

## 2019-10-01 DIAGNOSIS — Z86010 Personal history of colonic polyps: Secondary | ICD-10-CM | POA: Diagnosis not present

## 2019-10-01 DIAGNOSIS — E785 Hyperlipidemia, unspecified: Secondary | ICD-10-CM | POA: Diagnosis not present

## 2019-10-01 DIAGNOSIS — K5730 Diverticulosis of large intestine without perforation or abscess without bleeding: Secondary | ICD-10-CM | POA: Insufficient documentation

## 2019-10-01 DIAGNOSIS — Z79899 Other long term (current) drug therapy: Secondary | ICD-10-CM | POA: Insufficient documentation

## 2019-10-01 DIAGNOSIS — K2100 Gastro-esophageal reflux disease with esophagitis, without bleeding: Secondary | ICD-10-CM | POA: Insufficient documentation

## 2019-10-01 DIAGNOSIS — K2270 Barrett's esophagus without dysplasia: Secondary | ICD-10-CM | POA: Diagnosis not present

## 2019-10-01 DIAGNOSIS — K641 Second degree hemorrhoids: Secondary | ICD-10-CM | POA: Diagnosis not present

## 2019-10-01 DIAGNOSIS — I10 Essential (primary) hypertension: Secondary | ICD-10-CM | POA: Diagnosis not present

## 2019-10-01 DIAGNOSIS — Z87891 Personal history of nicotine dependence: Secondary | ICD-10-CM | POA: Insufficient documentation

## 2019-10-01 DIAGNOSIS — K635 Polyp of colon: Secondary | ICD-10-CM | POA: Diagnosis not present

## 2019-10-01 DIAGNOSIS — K5790 Diverticulosis of intestine, part unspecified, without perforation or abscess without bleeding: Secondary | ICD-10-CM | POA: Diagnosis not present

## 2019-10-01 DIAGNOSIS — K228 Other specified diseases of esophagus: Secondary | ICD-10-CM | POA: Insufficient documentation

## 2019-10-01 DIAGNOSIS — D128 Benign neoplasm of rectum: Secondary | ICD-10-CM | POA: Diagnosis not present

## 2019-10-01 DIAGNOSIS — K649 Unspecified hemorrhoids: Secondary | ICD-10-CM | POA: Diagnosis not present

## 2019-10-01 DIAGNOSIS — Z85828 Personal history of other malignant neoplasm of skin: Secondary | ICD-10-CM | POA: Insufficient documentation

## 2019-10-01 HISTORY — PX: COLONOSCOPY WITH PROPOFOL: SHX5780

## 2019-10-01 HISTORY — PX: ESOPHAGOGASTRODUODENOSCOPY (EGD) WITH PROPOFOL: SHX5813

## 2019-10-01 LAB — GLUCOSE, CAPILLARY: Glucose-Capillary: 145 mg/dL — ABNORMAL HIGH (ref 70–99)

## 2019-10-01 SURGERY — COLONOSCOPY WITH PROPOFOL
Anesthesia: General

## 2019-10-01 MED ORDER — PROPOFOL 500 MG/50ML IV EMUL
INTRAVENOUS | Status: DC | PRN
  Administered 2019-10-01: 130 ug/kg/min via INTRAVENOUS

## 2019-10-01 MED ORDER — SODIUM CHLORIDE 0.9 % IV SOLN: INTRAVENOUS | Status: DC

## 2019-10-01 MED ORDER — LIDOCAINE HCL (CARDIAC) PF 100 MG/5ML IV SOSY
PREFILLED_SYRINGE | INTRAVENOUS | Status: DC | PRN
  Administered 2019-10-01: 50 mg via INTRAVENOUS

## 2019-10-01 MED ORDER — PROPOFOL 10 MG/ML IV BOLUS
INTRAVENOUS | Status: DC | PRN
  Administered 2019-10-01: 80 mg via INTRAVENOUS
  Administered 2019-10-01 (×2): 20 mg via INTRAVENOUS

## 2019-10-01 NOTE — H&P (Signed)
Outpatient short stay form Pre-procedure 10/01/2019 12:30 PM Raylene Miyamoto MD, MPH  Primary Physician: Dr. Caryl Comes  Reason for visit:  Microcytic anemia  History of present illness:   74 y/o gentleman with microcytic anemia here for EGD/Colonoscopy for BE's surveillance and colon surveillance. Last took plavix 5 days ago. No family history of GI malignancies.   Current Facility-Administered Medications:  .  0.9 %  sodium chloride infusion, , Intravenous, Continuous, Shannon Kirkendall, Hilton Cork, MD, Last Rate: 20 mL/hr at 10/01/19 1227, New Bag at 10/01/19 1227  Medications Prior to Admission  Medication Sig Dispense Refill Last Dose  . aspirin 81 MG tablet Take 81 mg by mouth daily.   09/26/2019 at Unknown time  . brimonidine (ALPHAGAN) 0.2 % ophthalmic solution Place 1 drop into both eyes 2 (two) times daily.   10/01/2019 at Unknown time  . clopidogrel (PLAVIX) 75 MG tablet TAKE 1 TABLET BY MOUTH EVERY DAY 90 tablet 3 09/26/2019 at Unknown time  . dorzolamide (TRUSOPT) 2 % ophthalmic solution Place 1 drop into both eyes 2 (two) times daily.   10/01/2019 at Unknown time  . empagliflozin (JARDIANCE) 25 MG TABS tablet Take 25 mg by mouth daily.   09/30/2019 at Unknown time  . glipiZIDE (GLUCOTROL) 10 MG tablet Take 10 mg by mouth daily before breakfast.   09/30/2019 at Unknown time  . hydrochlorothiazide (HYDRODIURIL) 25 MG tablet Take 25 mg by mouth daily.   09/30/2019 at Unknown time  . ipratropium-albuterol (DUONEB) 0.5-2.5 (3) MG/3ML SOLN Take 3 mLs by nebulization 2 (two) times daily with breakfast and lunch.   09/30/2019 at Unknown time  . latanoprost (XALATAN) 0.005 % ophthalmic solution Place 1 drop into both eyes at bedtime.   10/01/2019 at Unknown time  . lisinopril (ZESTRIL) 40 MG tablet Take 40 mg by mouth daily.   09/30/2019 at Unknown time  . metFORMIN (GLUCOPHAGE) 1000 MG tablet Take 1,000 mg by mouth daily with breakfast.   09/30/2019 at Unknown time  . mirtazapine (REMERON) 15 MG tablet Take 1 tablet (15  mg total) by mouth at bedtime. 30 tablet 1 09/30/2019 at Unknown time  . Multiple Vitamin (MULTIVITAMIN WITH MINERALS) TABS tablet Take 1 tablet by mouth daily.   09/30/2019 at Unknown time  . pioglitazone (ACTOS) 15 MG tablet Take 15 mg by mouth daily.   09/30/2019 at Unknown time  . timolol (TIMOPTIC) 0.5 % ophthalmic solution Place 1 drop into both eyes 2 (two) times daily.   10/01/2019 at Unknown time  . antiseptic oral rinse (BIOTENE) LIQD 15 mLs by Mouth Rinse route as needed for dry mouth. (Patient not taking: Reported on 10/01/2019)   Not Taking at Unknown time  . Cinnamon 500 MG capsule Take 500 mg by mouth daily. (Patient not taking: Reported on 10/01/2019)   Not Taking at Unknown time  . docusate sodium (COLACE) 100 MG capsule Take 1 capsule (100 mg total) by mouth 2 (two) times daily. (Patient not taking: Reported on 10/01/2019) 10 capsule 0 Not Taking at Unknown time  . Ensure Max Protein (ENSURE MAX PROTEIN) LIQD Take 330 mLs (11 oz total) by mouth 2 (two) times daily between meals. (Patient not taking: Reported on 10/01/2019)   Not Taking at Unknown time  . feeding supplement, GLUCERNA SHAKE, (GLUCERNA SHAKE) LIQD Take 237 mLs by mouth 3 (three) times daily between meals. (Patient not taking: Reported on 10/01/2019)  0 Not Taking at Unknown time  . gabapentin (NEURONTIN) 100 MG capsule Take 100 mg by mouth every  morning. (Patient not taking: Reported on 10/01/2019)   Not Taking at Unknown time  . niacin 500 MG CR capsule Take 500 mg by mouth at bedtime.  (Patient not taking: Reported on 10/01/2019)   Not Taking at Unknown time  . pantoprazole (PROTONIX) 40 MG tablet Take 1 tablet (40 mg total) by mouth daily. (Patient not taking: Reported on 10/01/2019) 30 tablet 1 Not Taking at Unknown time  . Polyethyl Glycol-Propyl Glycol (SYSTANE ULTRA OP) Apply 1 drop to eye 3 (three) times daily as needed (Dry eyes). (Patient not taking: Reported on 10/01/2019)   Not Taking at Unknown time  . simvastatin (ZOCOR) 40 MG tablet  Take 20 mg by mouth daily at 6 PM. TAKES 0.5 TABLET (Patient not taking: Reported on 10/01/2019)   Not Taking at Unknown time     No Known Allergies   Past Medical History:  Diagnosis Date  . Arthritis   . Asthma    childhood asthma  . Cancer ([place])    Basal Cell Skin Cancer  . Diabetes mellitus without complication ([place])   . GERD (gastroesophageal reflux disease)   . Glaucoma   . History of chicken pox   . Hyperlipemia   . Hypertension   . Peripheral vascular disease ([place])     Review of systems:  Otherwise negative.    Physical Exam  Gen: Alert, oriented. Appears stated age.  HEENT: [place]/AT. PERRLA. Lungs: No respiratory distress Abd: soft, benign, no masses.  Ext: No edema.    Planned procedures: Proceed with EGD/colonoscopy. The patient understands the nature of the planned procedure, indications, risks, alternatives and potential complications including but not limited to bleeding, infection, perforation, damage to internal organs and possible oversedation/side effects from anesthesia. The patient agrees and gives consent to proceed.  Please refer to procedure notes for findings, recommendations and patient disposition/instructions.     Raylene Miyamoto MD, MPH Gastroenterology 10/01/2019  12:30 PM

## 2019-10-01 NOTE — Interval H&P Note (Signed)
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  The various methods of treatment have been discussed with the patient and family. After consideration of risks, benefits and other options for treatment, the patient has consented to  Procedure(s): COLONOSCOPY WITH PROPOFOL (N/A) ESOPHAGOGASTRODUODENOSCOPY (EGD) WITH PROPOFOL (N/A) as a surgical intervention.  The patient's history has been reviewed, patient examined, no change in status, stable for surgery.  I have reviewed the patient's chart and labs.  Questions were answered to the patient's satisfaction.     Gerald Clark  Ok to proceed with EGD/Colonoscopy

## 2019-10-01 NOTE — Op Note (Signed)
[facility] Gastroenterology Patient Name: Gerald Clark Procedure Date: 10/01/2019 1:16 PM MRN: [id number] Account #: [id number] Date of Birth: 1945/05/25 Admit Type: Outpatient Age: 74 Room: [facility] ENDO ROOM 3 Gender: Male Note Status: Finalized Procedure:             Colonoscopy Indications:           Surveillance: Personal history of adenomatous polyps                         on last colonoscopy 5 years ago, High risk colon                         cancer surveillance: Personal history of non-advanced                         adenoma Providers:             Andrey Farmer MD, MD Referring MD:          Ramonita Lab, MD (Referring MD) Medicines:             Monitored Anesthesia Care Complications:         No immediate complications. Estimated blood loss:                         Minimal. Procedure:             Pre-Anesthesia Assessment:                        - Prior to the procedure, a History and Physical was                         performed, and patient medications and allergies were                         reviewed. The patient is competent. The risks and                         benefits of the procedure and the sedation options and                         risks were discussed with the patient. All questions                         were answered and informed consent was obtained.                         Patient identification and proposed procedure were                         verified by the physician, the nurse, the anesthetist                         and the technician in the endoscopy suite. Mental                         Status Examination: alert and oriented. Airway  Examination: normal oropharyngeal airway and neck                         mobility. Respiratory Examination: clear to                         auscultation. CV Examination: normal. Prophylactic                         Antibiotics: The patient does not require prophylactic                          antibiotics. Prior Anticoagulants: The patient has                         taken Plavix (clopidogrel), last dose was 5 days prior                         to procedure. ASA Grade Assessment: III - A patient                         with severe systemic disease. After reviewing the                         risks and benefits, the patient was deemed in                         satisfactory condition to undergo the procedure. The                         anesthesia plan was to use monitored anesthesia care                         (MAC). Immediately prior to administration of                         medications, the patient was re-assessed for adequacy                         to receive sedatives. The heart rate, respiratory                         rate, oxygen saturations, blood pressure, adequacy of                         pulmonary ventilation, and response to care were                         monitored throughout the procedure. The physical                         status of the patient was re-assessed after the                         procedure.                        After obtaining informed consent, the colonoscope was  passed under direct vision. Throughout the procedure,                         the patient's blood pressure, pulse, and oxygen                         saturations were monitored continuously. The                         Colonoscope was introduced through the anus and                         advanced to the the terminal ileum. The colonoscopy                         was performed without difficulty. The patient                         tolerated the procedure well. The quality of the bowel                         preparation was adequate to identify polyps. Findings:      The perianal and digital rectal examinations were normal.      The terminal ileum appeared normal.      Two sessile polyps were found in the cecum. The polyps were  1 to 2 mm in       size. These polyps were removed with a jumbo cold forceps. Resection and       retrieval were complete. Estimated blood loss was minimal.      A few small-mouthed diverticula were found in the sigmoid colon,       descending colon, transverse colon, ascending colon and cecum.      A 4 mm polyp was found in the recto-sigmoid colon. The polyp was       sessile. The polyp was removed with a cold snare. Resection and       retrieval were complete. Estimated blood loss was minimal.      Internal hemorrhoids were found during retroflexion. The hemorrhoids       were Grade II (internal hemorrhoids that prolapse but reduce       spontaneously).      The exam was otherwise without abnormality on direct and retroflexion       views. Impression:            - The examined portion of the ileum was normal.                        - Two 1 to 2 mm polyps in the cecum, removed with a                         jumbo cold forceps. Resected and retrieved.                        - Diverticulosis in the sigmoid colon, in the                         descending colon, in the transverse colon, in the  ascending colon and in the cecum.                        - One 4 mm polyp at the recto-sigmoid colon, removed                         with a cold snare. Resected and retrieved.                        - Internal hemorrhoids.                        - The examination was otherwise normal on direct and                         retroflexion views. Recommendation:        - Discharge patient to home.                        - Resume previous diet.                        - Resume Plavix (clopidogrel) at prior dose today.                        - Await pathology results.                        - Repeat colonoscopy for surveillance based on                         pathology results.                        - Return to referring physician as previously                          scheduled. Procedure Code(s):     --- Professional ---                        [phone], Colonoscopy, flexible; with removal of                         tumor(s), polyp(s), or other lesion(s) by snare                         technique                        45380, 29, Colonoscopy, flexible; with biopsy, single                         or multiple Diagnosis Code(s):     --- Professional ---                        K64.1, Second degree hemorrhoids                        K63.5, Polyp of colon  Z86.010, Personal history of colonic polyps                        K57.30, Diverticulosis of large intestine without                         perforation or abscess without bleeding CPT copyright 2019 American Medical Association. All rights reserved. The codes documented in this report are preliminary and upon coder review may  be revised to meet current compliance requirements. Andrey Farmer, MD Andrey Farmer MD, MD 10/01/2019 2:14:32 PM Number of Addenda: 0 Note Initiated On: 10/01/2019 1:16 PM Scope Withdrawal Time: 0 hours 16 minutes 27 seconds  Total Procedure Duration: 0 hours 22 minutes 32 seconds  Estimated Blood Loss:  Estimated blood loss was minimal.      [facility]

## 2019-10-01 NOTE — Op Note (Signed)
[facility] Gastroenterology Patient Name: Gerald Clark Procedure Date: 10/01/2019 1:17 PM MRN: [id number] Account #: [id number] Date of Birth: 20-Feb-1945 Admit Type: Outpatient Age: 74 Room: [facility] ENDO ROOM 3 Gender: Male Note Status: Finalized Procedure:             Upper GI endoscopy Indications:           Gastro-esophageal reflux disease Providers:             Andrey Farmer MD, MD Referring MD:          Ramonita Lab, MD (Referring MD) Medicines:             Monitored Anesthesia Care Complications:         No immediate complications. Estimated blood loss:                         Minimal. Procedure:             Pre-Anesthesia Assessment:                        - Prior to the procedure, a History and Physical was                         performed, and patient medications and allergies were                         reviewed. The patient is competent. The risks and                         benefits of the procedure and the sedation options and                         risks were discussed with the patient. All questions                         were answered and informed consent was obtained.                         Patient identification and proposed procedure were                         verified by the physician, the nurse, the anesthetist                         and the technician in the endoscopy suite. Mental                         Status Examination: alert and oriented. Airway                         Examination: normal oropharyngeal airway and neck                         mobility. Respiratory Examination: clear to                         auscultation. CV Examination: normal. Prophylactic  Antibiotics: The patient does not require prophylactic                         antibiotics. Prior Anticoagulants: The patient has                         taken Plavix (clopidogrel), last dose was 5 days prior                         to procedure. ASA  Grade Assessment: III - A patient                         with severe systemic disease. After reviewing the                         risks and benefits, the patient was deemed in                         satisfactory condition to undergo the procedure. The                         anesthesia plan was to use monitored anesthesia care                         (MAC). Immediately prior to administration of                         medications, the patient was re-assessed for adequacy                         to receive sedatives. The heart rate, respiratory                         rate, oxygen saturations, blood pressure, adequacy of                         pulmonary ventilation, and response to care were                         monitored throughout the procedure. The physical                         status of the patient was re-assessed after the                         procedure.                        After obtaining informed consent, the endoscope was                         passed under direct vision. Throughout the procedure,                         the patient's blood pressure, pulse, and oxygen                         saturations were monitored continuously. The Endoscope  was introduced through the mouth, and advanced to the                         second part of duodenum. The upper GI endoscopy was                         accomplished without difficulty. The patient tolerated                         the procedure well. Findings:      There were esophageal mucosal changes consistent with short-segment       Barrett's esophagus present in the lower third of the esophagus. The       maximum longitudinal extent of these mucosal changes was 2 cm in length.       Mucosa was biopsied with a cold forceps for histology in 4 quadrants at       intervals of 2 cm in the lower third of the esophagus. A total of 2       specimen bottles were sent to pathology. Estimated blood loss  was       minimal.      A small hiatal hernia was present.      The exam of the esophagus was otherwise normal.      The entire examined stomach was normal.      The examined duodenum was normal. Impression:            - Esophageal mucosal changes consistent with                         short-segment Barrett's esophagus. Biopsied.                        - Small hiatal hernia.                        - Normal stomach.                        - Normal examined duodenum. Recommendation:        - Discharge patient to home.                        - Resume previous diet.                        - Resume Plavix (clopidogrel) at prior dose today.                        - Await pathology results.                        - Repeat upper endoscopy in 3 years for surveillance.                        - Return to referring physician as previously                         scheduled.                        - Use Protonix (pantoprazole) 40 mg PO daily. Procedure  Code(s):     --- Professional ---                        [phone], Esophagogastroduodenoscopy, flexible,                         transoral; with biopsy, single or multiple Diagnosis Code(s):     --- Professional ---                        K22.8, Other specified diseases of esophagus                        K44.9, Diaphragmatic hernia without obstruction or                         gangrene                        K21.9, Gastro-esophageal reflux disease without                         esophagitis CPT copyright 2019 American Medical Association. All rights reserved. The codes documented in this report are preliminary and upon coder review may  be revised to meet current compliance requirements. Andrey Farmer, MD Andrey Farmer MD, MD 10/01/2019 2:09:47 PM Number of Addenda: 0 Note Initiated On: 10/01/2019 1:17 PM Estimated Blood Loss:  Estimated blood loss was minimal.      [facility]

## 2019-10-01 NOTE — Transfer of Care (Signed)
Immediate Anesthesia Transfer of Care Note  Patient: Gerald Clark  Procedure(s) Performed: COLONOSCOPY WITH PROPOFOL (N/A ) ESOPHAGOGASTRODUODENOSCOPY (EGD) WITH PROPOFOL (N/A )  Patient Location: PACU  Anesthesia Type:General  Level of Consciousness: drowsy  Airway & Oxygen Therapy: Patient Spontanous Breathing  Post-op Assessment: Report given to RN  Post vital signs: Reviewed  Last Vitals:  Vitals Value Taken Time  BP 84/56 10/01/19 1408  Temp 35.7 C 10/01/19 1408  Pulse 81 10/01/19 1408  Resp 32 10/01/19 1408  SpO2 93 % 10/01/19 1408    Last Pain:  Vitals:   10/01/19 1408  TempSrc: Temporal  PainSc: Asleep         Complications: No complications documented.

## 2019-10-01 NOTE — Anesthesia Postprocedure Evaluation (Signed)
Anesthesia Post Note  Patient: SECUNDINO ELLITHORPE  Procedure(s) Performed: COLONOSCOPY WITH PROPOFOL (N/A ) ESOPHAGOGASTRODUODENOSCOPY (EGD) WITH PROPOFOL (N/A )  Patient location during evaluation: Endoscopy Anesthesia Type: General Level of consciousness: awake and alert Pain management: pain level controlled Vital Signs Assessment: post-procedure vital signs reviewed and stable Respiratory status: spontaneous breathing and respiratory function stable Cardiovascular status: stable Anesthetic complications: no   No complications documented.   Last Vitals:  Vitals:   10/01/19 1438 10/01/19 1439  BP:  120/71  Pulse: 72 71  Resp: 16 19  Temp:    SpO2: 94% 96%    Last Pain:  Vitals:   10/01/19 1439  TempSrc:   PainSc: 0-No pain                 Greenlee Ancheta K

## 2019-10-01 NOTE — Anesthesia Preprocedure Evaluation (Signed)
Anesthesia Evaluation  Patient identified by MRN, date of birth, ID band Patient awake    Reviewed: Allergy & Precautions, NPO status , Patient's Chart, lab work & pertinent test results  History of Anesthesia Complications Negative for: history of anesthetic complications  Airway Mallampati: II       Dental   Pulmonary asthma , neg sleep apnea, Not current smoker, former smoker,           Cardiovascular hypertension, Pt. on medications (-) Past MI and (-) CHF (-) dysrhythmias (-) Valvular Problems/Murmurs     Neuro/Psych neg Seizures    GI/Hepatic Neg liver ROS, GERD  Medicated and Controlled,  Endo/Other  diabetes, Type 2, Oral Hypoglycemic Agents  Renal/GU negative Renal ROS     Musculoskeletal   Abdominal   Peds  Hematology   Anesthesia Other Findings   Reproductive/Obstetrics                             Anesthesia Physical Anesthesia Plan  ASA: II  Anesthesia Plan: General   Post-op Pain Management:    Induction: Intravenous  PONV Risk Score and Plan: 2 and Propofol infusion and TIVA  Airway Management Planned: Nasal Cannula  Additional Equipment:   Intra-op Plan:   Post-operative Plan:   Informed Consent: I have reviewed the patients History and Physical, chart, labs and discussed the procedure including the risks, benefits and alternatives for the proposed anesthesia with the patient or authorized representative who has indicated his/her understanding and acceptance.       Plan Discussed with:   Anesthesia Plan Comments:         Anesthesia Quick Evaluation

## 2019-10-02 ENCOUNTER — Encounter: Payer: Self-pay | Admitting: Gastroenterology

## 2019-10-03 LAB — SURGICAL PATHOLOGY

## 2019-10-12 DIAGNOSIS — J449 Chronic obstructive pulmonary disease, unspecified: Secondary | ICD-10-CM | POA: Diagnosis not present

## 2019-10-12 DIAGNOSIS — J432 Centrilobular emphysema: Secondary | ICD-10-CM | POA: Diagnosis not present

## 2019-11-08 DIAGNOSIS — J849 Interstitial pulmonary disease, unspecified: Secondary | ICD-10-CM | POA: Diagnosis not present

## 2019-11-08 DIAGNOSIS — Z01818 Encounter for other preprocedural examination: Secondary | ICD-10-CM | POA: Diagnosis not present

## 2019-11-11 DIAGNOSIS — J449 Chronic obstructive pulmonary disease, unspecified: Secondary | ICD-10-CM | POA: Diagnosis not present

## 2019-11-11 DIAGNOSIS — J432 Centrilobular emphysema: Secondary | ICD-10-CM | POA: Diagnosis not present

## 2019-11-19 DIAGNOSIS — J8410 Pulmonary fibrosis, unspecified: Secondary | ICD-10-CM | POA: Diagnosis not present

## 2019-11-19 DIAGNOSIS — J9611 Chronic respiratory failure with hypoxia: Secondary | ICD-10-CM | POA: Diagnosis not present

## 2019-11-20 DIAGNOSIS — J449 Chronic obstructive pulmonary disease, unspecified: Secondary | ICD-10-CM | POA: Diagnosis not present

## 2019-12-05 DIAGNOSIS — L578 Other skin changes due to chronic exposure to nonionizing radiation: Secondary | ICD-10-CM | POA: Diagnosis not present

## 2019-12-05 DIAGNOSIS — Z85828 Personal history of other malignant neoplasm of skin: Secondary | ICD-10-CM | POA: Diagnosis not present

## 2019-12-05 DIAGNOSIS — Z859 Personal history of malignant neoplasm, unspecified: Secondary | ICD-10-CM | POA: Diagnosis not present

## 2019-12-05 DIAGNOSIS — Z872 Personal history of diseases of the skin and subcutaneous tissue: Secondary | ICD-10-CM | POA: Diagnosis not present

## 2019-12-05 DIAGNOSIS — L570 Actinic keratosis: Secondary | ICD-10-CM | POA: Diagnosis not present

## 2019-12-12 DIAGNOSIS — J449 Chronic obstructive pulmonary disease, unspecified: Secondary | ICD-10-CM | POA: Diagnosis not present

## 2019-12-12 DIAGNOSIS — J432 Centrilobular emphysema: Secondary | ICD-10-CM | POA: Diagnosis not present

## 2019-12-21 DIAGNOSIS — J8410 Pulmonary fibrosis, unspecified: Secondary | ICD-10-CM | POA: Diagnosis not present

## 2019-12-31 DIAGNOSIS — E113293 Type 2 diabetes mellitus with mild nonproliferative diabetic retinopathy without macular edema, bilateral: Secondary | ICD-10-CM | POA: Diagnosis not present

## 2019-12-31 DIAGNOSIS — H2513 Age-related nuclear cataract, bilateral: Secondary | ICD-10-CM | POA: Diagnosis not present

## 2019-12-31 DIAGNOSIS — H401133 Primary open-angle glaucoma, bilateral, severe stage: Secondary | ICD-10-CM | POA: Diagnosis not present

## 2020-01-08 DIAGNOSIS — E611 Iron deficiency: Secondary | ICD-10-CM | POA: Diagnosis not present

## 2020-01-11 DIAGNOSIS — J432 Centrilobular emphysema: Secondary | ICD-10-CM | POA: Diagnosis not present

## 2020-01-11 DIAGNOSIS — J449 Chronic obstructive pulmonary disease, unspecified: Secondary | ICD-10-CM | POA: Diagnosis not present

## 2020-01-20 DIAGNOSIS — J8410 Pulmonary fibrosis, unspecified: Secondary | ICD-10-CM | POA: Diagnosis not present

## 2020-02-11 DIAGNOSIS — J432 Centrilobular emphysema: Secondary | ICD-10-CM | POA: Diagnosis not present

## 2020-02-11 DIAGNOSIS — J449 Chronic obstructive pulmonary disease, unspecified: Secondary | ICD-10-CM | POA: Diagnosis not present

## 2020-02-18 DIAGNOSIS — J84112 Idiopathic pulmonary fibrosis: Secondary | ICD-10-CM | POA: Diagnosis not present

## 2020-02-20 DIAGNOSIS — J8410 Pulmonary fibrosis, unspecified: Secondary | ICD-10-CM | POA: Diagnosis not present

## 2020-03-12 DIAGNOSIS — D649 Anemia, unspecified: Secondary | ICD-10-CM | POA: Diagnosis not present

## 2020-03-12 DIAGNOSIS — E118 Type 2 diabetes mellitus with unspecified complications: Secondary | ICD-10-CM | POA: Diagnosis not present

## 2020-03-12 DIAGNOSIS — E7849 Other hyperlipidemia: Secondary | ICD-10-CM | POA: Diagnosis not present

## 2020-03-12 DIAGNOSIS — I10 Essential (primary) hypertension: Secondary | ICD-10-CM | POA: Diagnosis not present

## 2020-03-13 DIAGNOSIS — J432 Centrilobular emphysema: Secondary | ICD-10-CM | POA: Diagnosis not present

## 2020-03-13 DIAGNOSIS — J449 Chronic obstructive pulmonary disease, unspecified: Secondary | ICD-10-CM | POA: Diagnosis not present

## 2020-03-19 DIAGNOSIS — D649 Anemia, unspecified: Secondary | ICD-10-CM | POA: Diagnosis not present

## 2020-03-19 DIAGNOSIS — I700 Atherosclerosis of aorta: Secondary | ICD-10-CM | POA: Diagnosis not present

## 2020-03-19 DIAGNOSIS — E1151 Type 2 diabetes mellitus with diabetic peripheral angiopathy without gangrene: Secondary | ICD-10-CM | POA: Diagnosis not present

## 2020-03-19 DIAGNOSIS — I2510 Atherosclerotic heart disease of native coronary artery without angina pectoris: Secondary | ICD-10-CM | POA: Diagnosis not present

## 2020-03-19 DIAGNOSIS — J849 Interstitial pulmonary disease, unspecified: Secondary | ICD-10-CM | POA: Diagnosis not present

## 2020-03-19 DIAGNOSIS — I10 Essential (primary) hypertension: Secondary | ICD-10-CM | POA: Diagnosis not present

## 2020-03-19 DIAGNOSIS — E7849 Other hyperlipidemia: Secondary | ICD-10-CM | POA: Diagnosis not present

## 2020-03-19 DIAGNOSIS — E611 Iron deficiency: Secondary | ICD-10-CM | POA: Diagnosis not present

## 2020-03-22 DIAGNOSIS — J8410 Pulmonary fibrosis, unspecified: Secondary | ICD-10-CM | POA: Diagnosis not present

## 2020-03-31 DIAGNOSIS — I2510 Atherosclerotic heart disease of native coronary artery without angina pectoris: Secondary | ICD-10-CM | POA: Diagnosis not present

## 2020-04-10 DIAGNOSIS — J432 Centrilobular emphysema: Secondary | ICD-10-CM | POA: Diagnosis not present

## 2020-04-10 DIAGNOSIS — J449 Chronic obstructive pulmonary disease, unspecified: Secondary | ICD-10-CM | POA: Diagnosis not present

## 2020-04-12 DIAGNOSIS — E119 Type 2 diabetes mellitus without complications: Secondary | ICD-10-CM | POA: Diagnosis not present

## 2020-04-12 DIAGNOSIS — S0990XA Unspecified injury of head, initial encounter: Secondary | ICD-10-CM | POA: Diagnosis not present

## 2020-04-12 DIAGNOSIS — R000 Tachycardia, unspecified: Secondary | ICD-10-CM | POA: Diagnosis not present

## 2020-04-12 DIAGNOSIS — E785 Hyperlipidemia, unspecified: Secondary | ICD-10-CM | POA: Diagnosis not present

## 2020-04-12 DIAGNOSIS — Z7982 Long term (current) use of aspirin: Secondary | ICD-10-CM | POA: Diagnosis not present

## 2020-04-12 DIAGNOSIS — S0101XA Laceration without foreign body of scalp, initial encounter: Secondary | ICD-10-CM | POA: Diagnosis not present

## 2020-04-12 DIAGNOSIS — Z7984 Long term (current) use of oral hypoglycemic drugs: Secondary | ICD-10-CM | POA: Diagnosis not present

## 2020-04-12 DIAGNOSIS — Z87891 Personal history of nicotine dependence: Secondary | ICD-10-CM | POA: Diagnosis not present

## 2020-04-12 DIAGNOSIS — Z23 Encounter for immunization: Secondary | ICD-10-CM | POA: Diagnosis not present

## 2020-04-12 DIAGNOSIS — I10 Essential (primary) hypertension: Secondary | ICD-10-CM | POA: Diagnosis not present

## 2020-04-19 DIAGNOSIS — J8410 Pulmonary fibrosis, unspecified: Secondary | ICD-10-CM | POA: Diagnosis not present

## 2020-05-01 DIAGNOSIS — H2513 Age-related nuclear cataract, bilateral: Secondary | ICD-10-CM | POA: Diagnosis not present

## 2020-05-01 DIAGNOSIS — T6591XD Toxic effect of unspecified substance, accidental (unintentional), subsequent encounter: Secondary | ICD-10-CM | POA: Diagnosis not present

## 2020-05-01 DIAGNOSIS — E113293 Type 2 diabetes mellitus with mild nonproliferative diabetic retinopathy without macular edema, bilateral: Secondary | ICD-10-CM | POA: Diagnosis not present

## 2020-05-01 DIAGNOSIS — H401133 Primary open-angle glaucoma, bilateral, severe stage: Secondary | ICD-10-CM | POA: Diagnosis not present

## 2020-05-06 DIAGNOSIS — D500 Iron deficiency anemia secondary to blood loss (chronic): Secondary | ICD-10-CM | POA: Diagnosis not present

## 2020-05-11 DIAGNOSIS — J449 Chronic obstructive pulmonary disease, unspecified: Secondary | ICD-10-CM | POA: Diagnosis not present

## 2020-05-11 DIAGNOSIS — J432 Centrilobular emphysema: Secondary | ICD-10-CM | POA: Diagnosis not present

## 2020-05-20 DIAGNOSIS — J8410 Pulmonary fibrosis, unspecified: Secondary | ICD-10-CM | POA: Diagnosis not present

## 2020-06-03 DIAGNOSIS — C44519 Basal cell carcinoma of skin of other part of trunk: Secondary | ICD-10-CM | POA: Diagnosis not present

## 2020-06-03 DIAGNOSIS — L570 Actinic keratosis: Secondary | ICD-10-CM | POA: Diagnosis not present

## 2020-06-03 DIAGNOSIS — Z85828 Personal history of other malignant neoplasm of skin: Secondary | ICD-10-CM | POA: Diagnosis not present

## 2020-06-03 DIAGNOSIS — Z872 Personal history of diseases of the skin and subcutaneous tissue: Secondary | ICD-10-CM | POA: Diagnosis not present

## 2020-06-03 DIAGNOSIS — D0461 Carcinoma in situ of skin of right upper limb, including shoulder: Secondary | ICD-10-CM | POA: Diagnosis not present

## 2020-06-03 DIAGNOSIS — Z859 Personal history of malignant neoplasm, unspecified: Secondary | ICD-10-CM | POA: Diagnosis not present

## 2020-06-03 DIAGNOSIS — L578 Other skin changes due to chronic exposure to nonionizing radiation: Secondary | ICD-10-CM | POA: Diagnosis not present

## 2020-06-03 DIAGNOSIS — D485 Neoplasm of uncertain behavior of skin: Secondary | ICD-10-CM | POA: Diagnosis not present

## 2020-06-10 DIAGNOSIS — J449 Chronic obstructive pulmonary disease, unspecified: Secondary | ICD-10-CM | POA: Diagnosis not present

## 2020-06-10 DIAGNOSIS — J432 Centrilobular emphysema: Secondary | ICD-10-CM | POA: Diagnosis not present

## 2020-06-17 DIAGNOSIS — J9611 Chronic respiratory failure with hypoxia: Secondary | ICD-10-CM | POA: Diagnosis not present

## 2020-06-17 DIAGNOSIS — J449 Chronic obstructive pulmonary disease, unspecified: Secondary | ICD-10-CM | POA: Diagnosis not present

## 2020-06-17 DIAGNOSIS — J849 Interstitial pulmonary disease, unspecified: Secondary | ICD-10-CM | POA: Diagnosis not present

## 2020-06-17 DIAGNOSIS — J84112 Idiopathic pulmonary fibrosis: Secondary | ICD-10-CM | POA: Diagnosis not present

## 2020-06-18 ENCOUNTER — Other Ambulatory Visit: Payer: Self-pay | Admitting: Pulmonary Disease

## 2020-06-18 DIAGNOSIS — J849 Interstitial pulmonary disease, unspecified: Secondary | ICD-10-CM

## 2020-06-18 DIAGNOSIS — J84112 Idiopathic pulmonary fibrosis: Secondary | ICD-10-CM

## 2020-06-19 DIAGNOSIS — J8410 Pulmonary fibrosis, unspecified: Secondary | ICD-10-CM | POA: Diagnosis not present

## 2020-06-24 DIAGNOSIS — C44622 Squamous cell carcinoma of skin of right upper limb, including shoulder: Secondary | ICD-10-CM | POA: Diagnosis not present

## 2020-06-24 DIAGNOSIS — D0461 Carcinoma in situ of skin of right upper limb, including shoulder: Secondary | ICD-10-CM | POA: Diagnosis not present

## 2020-07-07 ENCOUNTER — Ambulatory Visit
Admission: RE | Admit: 2020-07-07 | Discharge: 2020-07-07 | Disposition: A | Discharge status: Home / Self Care | Payer: Medicare HMO | Source: Ambulatory Visit | Attending: Pulmonary Disease | Admitting: Pulmonary Disease

## 2020-07-07 ENCOUNTER — Other Ambulatory Visit: Payer: Self-pay

## 2020-07-07 DIAGNOSIS — J189 Pneumonia, unspecified organism: Secondary | ICD-10-CM | POA: Diagnosis not present

## 2020-07-07 DIAGNOSIS — J849 Interstitial pulmonary disease, unspecified: Secondary | ICD-10-CM | POA: Insufficient documentation

## 2020-07-07 DIAGNOSIS — J84112 Idiopathic pulmonary fibrosis: Secondary | ICD-10-CM | POA: Diagnosis not present

## 2020-07-08 DIAGNOSIS — C44519 Basal cell carcinoma of skin of other part of trunk: Secondary | ICD-10-CM | POA: Diagnosis not present

## 2020-07-08 DIAGNOSIS — C4491 Basal cell carcinoma of skin, unspecified: Secondary | ICD-10-CM | POA: Diagnosis not present

## 2020-07-11 DIAGNOSIS — J432 Centrilobular emphysema: Secondary | ICD-10-CM | POA: Diagnosis not present

## 2020-07-11 DIAGNOSIS — J449 Chronic obstructive pulmonary disease, unspecified: Secondary | ICD-10-CM | POA: Diagnosis not present

## 2020-07-20 DIAGNOSIS — J8410 Pulmonary fibrosis, unspecified: Secondary | ICD-10-CM | POA: Diagnosis not present

## 2020-08-10 DIAGNOSIS — J449 Chronic obstructive pulmonary disease, unspecified: Secondary | ICD-10-CM | POA: Diagnosis not present

## 2020-08-10 DIAGNOSIS — J432 Centrilobular emphysema: Secondary | ICD-10-CM | POA: Diagnosis not present

## 2020-08-19 DIAGNOSIS — D500 Iron deficiency anemia secondary to blood loss (chronic): Secondary | ICD-10-CM | POA: Diagnosis not present

## 2020-08-19 DIAGNOSIS — J8410 Pulmonary fibrosis, unspecified: Secondary | ICD-10-CM | POA: Diagnosis not present

## 2020-08-24 DIAGNOSIS — L600 Ingrowing nail: Secondary | ICD-10-CM | POA: Diagnosis not present

## 2020-08-24 DIAGNOSIS — B351 Tinea unguium: Secondary | ICD-10-CM | POA: Diagnosis not present

## 2020-08-24 DIAGNOSIS — E1140 Type 2 diabetes mellitus with diabetic neuropathy, unspecified: Secondary | ICD-10-CM | POA: Diagnosis not present

## 2020-09-04 DIAGNOSIS — H401133 Primary open-angle glaucoma, bilateral, severe stage: Secondary | ICD-10-CM | POA: Diagnosis not present

## 2020-09-04 DIAGNOSIS — H2513 Age-related nuclear cataract, bilateral: Secondary | ICD-10-CM | POA: Diagnosis not present

## 2020-09-11 DIAGNOSIS — E118 Type 2 diabetes mellitus with unspecified complications: Secondary | ICD-10-CM | POA: Diagnosis not present

## 2020-09-11 DIAGNOSIS — E611 Iron deficiency: Secondary | ICD-10-CM | POA: Diagnosis not present

## 2020-09-11 DIAGNOSIS — E7849 Other hyperlipidemia: Secondary | ICD-10-CM | POA: Diagnosis not present

## 2020-09-11 DIAGNOSIS — D649 Anemia, unspecified: Secondary | ICD-10-CM | POA: Diagnosis not present

## 2020-09-18 DIAGNOSIS — I10 Essential (primary) hypertension: Secondary | ICD-10-CM | POA: Diagnosis not present

## 2020-09-18 DIAGNOSIS — I2510 Atherosclerotic heart disease of native coronary artery without angina pectoris: Secondary | ICD-10-CM | POA: Diagnosis not present

## 2020-09-18 DIAGNOSIS — Z0000 Encounter for general adult medical examination without abnormal findings: Secondary | ICD-10-CM | POA: Diagnosis not present

## 2020-09-18 DIAGNOSIS — I700 Atherosclerosis of aorta: Secondary | ICD-10-CM | POA: Diagnosis not present

## 2020-09-18 DIAGNOSIS — J849 Interstitial pulmonary disease, unspecified: Secondary | ICD-10-CM | POA: Diagnosis not present

## 2020-09-18 DIAGNOSIS — Z1389 Encounter for screening for other disorder: Secondary | ICD-10-CM | POA: Diagnosis not present

## 2020-09-18 DIAGNOSIS — I70219 Atherosclerosis of native arteries of extremities with intermittent claudication, unspecified extremity: Secondary | ICD-10-CM | POA: Diagnosis not present

## 2020-09-18 DIAGNOSIS — E1151 Type 2 diabetes mellitus with diabetic peripheral angiopathy without gangrene: Secondary | ICD-10-CM | POA: Diagnosis not present

## 2020-09-18 DIAGNOSIS — E7849 Other hyperlipidemia: Secondary | ICD-10-CM | POA: Diagnosis not present

## 2020-09-19 DIAGNOSIS — J8410 Pulmonary fibrosis, unspecified: Secondary | ICD-10-CM | POA: Diagnosis not present

## 2020-10-14 DIAGNOSIS — J8410 Pulmonary fibrosis, unspecified: Secondary | ICD-10-CM | POA: Diagnosis not present

## 2020-10-20 DIAGNOSIS — J8410 Pulmonary fibrosis, unspecified: Secondary | ICD-10-CM | POA: Diagnosis not present

## 2020-11-19 DIAGNOSIS — J8410 Pulmonary fibrosis, unspecified: Secondary | ICD-10-CM | POA: Diagnosis not present

## 2020-12-07 DIAGNOSIS — Z872 Personal history of diseases of the skin and subcutaneous tissue: Secondary | ICD-10-CM | POA: Diagnosis not present

## 2020-12-07 DIAGNOSIS — D0462 Carcinoma in situ of skin of left upper limb, including shoulder: Secondary | ICD-10-CM | POA: Diagnosis not present

## 2020-12-07 DIAGNOSIS — Z859 Personal history of malignant neoplasm, unspecified: Secondary | ICD-10-CM | POA: Diagnosis not present

## 2020-12-07 DIAGNOSIS — Z85828 Personal history of other malignant neoplasm of skin: Secondary | ICD-10-CM | POA: Diagnosis not present

## 2020-12-07 DIAGNOSIS — L578 Other skin changes due to chronic exposure to nonionizing radiation: Secondary | ICD-10-CM | POA: Diagnosis not present

## 2020-12-07 DIAGNOSIS — L570 Actinic keratosis: Secondary | ICD-10-CM | POA: Diagnosis not present

## 2020-12-07 DIAGNOSIS — D485 Neoplasm of uncertain behavior of skin: Secondary | ICD-10-CM | POA: Diagnosis not present

## 2020-12-07 DIAGNOSIS — C44319 Basal cell carcinoma of skin of other parts of face: Secondary | ICD-10-CM | POA: Diagnosis not present

## 2020-12-14 DIAGNOSIS — J849 Interstitial pulmonary disease, unspecified: Secondary | ICD-10-CM | POA: Diagnosis not present

## 2020-12-14 DIAGNOSIS — Z01818 Encounter for other preprocedural examination: Secondary | ICD-10-CM | POA: Diagnosis not present

## 2020-12-16 DIAGNOSIS — D649 Anemia, unspecified: Secondary | ICD-10-CM | POA: Diagnosis not present

## 2020-12-16 DIAGNOSIS — E118 Type 2 diabetes mellitus with unspecified complications: Secondary | ICD-10-CM | POA: Diagnosis not present

## 2020-12-20 DIAGNOSIS — J8410 Pulmonary fibrosis, unspecified: Secondary | ICD-10-CM | POA: Diagnosis not present

## 2020-12-23 DIAGNOSIS — R972 Elevated prostate specific antigen [PSA]: Secondary | ICD-10-CM | POA: Diagnosis not present

## 2020-12-23 DIAGNOSIS — J849 Interstitial pulmonary disease, unspecified: Secondary | ICD-10-CM | POA: Diagnosis not present

## 2020-12-23 DIAGNOSIS — I10 Essential (primary) hypertension: Secondary | ICD-10-CM | POA: Diagnosis not present

## 2020-12-23 DIAGNOSIS — D509 Iron deficiency anemia, unspecified: Secondary | ICD-10-CM | POA: Diagnosis not present

## 2020-12-23 DIAGNOSIS — I70219 Atherosclerosis of native arteries of extremities with intermittent claudication, unspecified extremity: Secondary | ICD-10-CM | POA: Diagnosis not present

## 2020-12-23 DIAGNOSIS — E785 Hyperlipidemia, unspecified: Secondary | ICD-10-CM | POA: Diagnosis not present

## 2020-12-23 DIAGNOSIS — J8489 Other specified interstitial pulmonary diseases: Secondary | ICD-10-CM | POA: Diagnosis not present

## 2020-12-23 DIAGNOSIS — E1151 Type 2 diabetes mellitus with diabetic peripheral angiopathy without gangrene: Secondary | ICD-10-CM | POA: Diagnosis not present

## 2021-01-08 DIAGNOSIS — H401133 Primary open-angle glaucoma, bilateral, severe stage: Secondary | ICD-10-CM | POA: Diagnosis not present

## 2021-01-08 DIAGNOSIS — E113293 Type 2 diabetes mellitus with mild nonproliferative diabetic retinopathy without macular edema, bilateral: Secondary | ICD-10-CM | POA: Diagnosis not present

## 2021-01-08 DIAGNOSIS — H2513 Age-related nuclear cataract, bilateral: Secondary | ICD-10-CM | POA: Diagnosis not present

## 2021-01-12 DIAGNOSIS — L988 Other specified disorders of the skin and subcutaneous tissue: Secondary | ICD-10-CM | POA: Diagnosis not present

## 2021-01-12 DIAGNOSIS — C44629 Squamous cell carcinoma of skin of left upper limb, including shoulder: Secondary | ICD-10-CM | POA: Diagnosis not present

## 2021-01-19 DIAGNOSIS — J8410 Pulmonary fibrosis, unspecified: Secondary | ICD-10-CM | POA: Diagnosis not present

## 2021-01-26 DIAGNOSIS — D0462 Carcinoma in situ of skin of left upper limb, including shoulder: Secondary | ICD-10-CM | POA: Diagnosis not present

## 2021-01-26 DIAGNOSIS — C44629 Squamous cell carcinoma of skin of left upper limb, including shoulder: Secondary | ICD-10-CM | POA: Diagnosis not present

## 2021-02-19 DIAGNOSIS — J8410 Pulmonary fibrosis, unspecified: Secondary | ICD-10-CM | POA: Diagnosis not present

## 2021-02-25 DIAGNOSIS — E1136 Type 2 diabetes mellitus with diabetic cataract: Secondary | ICD-10-CM | POA: Diagnosis not present

## 2021-02-25 DIAGNOSIS — H401123 Primary open-angle glaucoma, left eye, severe stage: Secondary | ICD-10-CM | POA: Diagnosis not present

## 2021-02-25 DIAGNOSIS — I2510 Atherosclerotic heart disease of native coronary artery without angina pectoris: Secondary | ICD-10-CM | POA: Diagnosis not present

## 2021-02-25 DIAGNOSIS — J449 Chronic obstructive pulmonary disease, unspecified: Secondary | ICD-10-CM | POA: Diagnosis not present

## 2021-02-25 DIAGNOSIS — Z85828 Personal history of other malignant neoplasm of skin: Secondary | ICD-10-CM | POA: Diagnosis not present

## 2021-02-25 DIAGNOSIS — E785 Hyperlipidemia, unspecified: Secondary | ICD-10-CM | POA: Diagnosis not present

## 2021-02-25 DIAGNOSIS — H401133 Primary open-angle glaucoma, bilateral, severe stage: Secondary | ICD-10-CM | POA: Diagnosis not present

## 2021-02-25 DIAGNOSIS — H2512 Age-related nuclear cataract, left eye: Secondary | ICD-10-CM | POA: Diagnosis not present

## 2021-02-25 DIAGNOSIS — I10 Essential (primary) hypertension: Secondary | ICD-10-CM | POA: Diagnosis not present

## 2021-02-25 DIAGNOSIS — E1151 Type 2 diabetes mellitus with diabetic peripheral angiopathy without gangrene: Secondary | ICD-10-CM | POA: Diagnosis not present

## 2021-03-22 DIAGNOSIS — J8410 Pulmonary fibrosis, unspecified: Secondary | ICD-10-CM | POA: Diagnosis not present

## 2021-04-13 DIAGNOSIS — J849 Interstitial pulmonary disease, unspecified: Secondary | ICD-10-CM | POA: Diagnosis not present

## 2021-04-15 DIAGNOSIS — E118 Type 2 diabetes mellitus with unspecified complications: Secondary | ICD-10-CM | POA: Diagnosis not present

## 2021-04-15 DIAGNOSIS — D649 Anemia, unspecified: Secondary | ICD-10-CM | POA: Diagnosis not present

## 2021-04-15 DIAGNOSIS — E7849 Other hyperlipidemia: Secondary | ICD-10-CM | POA: Diagnosis not present

## 2021-04-19 DIAGNOSIS — J8410 Pulmonary fibrosis, unspecified: Secondary | ICD-10-CM | POA: Diagnosis not present

## 2021-04-22 DIAGNOSIS — I700 Atherosclerosis of aorta: Secondary | ICD-10-CM | POA: Diagnosis not present

## 2021-04-22 DIAGNOSIS — R972 Elevated prostate specific antigen [PSA]: Secondary | ICD-10-CM | POA: Diagnosis not present

## 2021-04-22 DIAGNOSIS — I10 Essential (primary) hypertension: Secondary | ICD-10-CM | POA: Diagnosis not present

## 2021-04-22 DIAGNOSIS — E1151 Type 2 diabetes mellitus with diabetic peripheral angiopathy without gangrene: Secondary | ICD-10-CM | POA: Diagnosis not present

## 2021-04-22 DIAGNOSIS — I70219 Atherosclerosis of native arteries of extremities with intermittent claudication, unspecified extremity: Secondary | ICD-10-CM | POA: Diagnosis not present

## 2021-04-22 DIAGNOSIS — J849 Interstitial pulmonary disease, unspecified: Secondary | ICD-10-CM | POA: Diagnosis not present

## 2021-05-04 DIAGNOSIS — C44319 Basal cell carcinoma of skin of other parts of face: Secondary | ICD-10-CM | POA: Diagnosis not present

## 2021-05-11 DIAGNOSIS — C44319 Basal cell carcinoma of skin of other parts of face: Secondary | ICD-10-CM | POA: Diagnosis not present

## 2021-05-20 DIAGNOSIS — J8410 Pulmonary fibrosis, unspecified: Secondary | ICD-10-CM | POA: Diagnosis not present

## 2021-05-21 DIAGNOSIS — I739 Peripheral vascular disease, unspecified: Secondary | ICD-10-CM | POA: Diagnosis not present

## 2021-05-31 DIAGNOSIS — H318 Other specified disorders of choroid: Secondary | ICD-10-CM | POA: Diagnosis not present

## 2021-05-31 DIAGNOSIS — H401133 Primary open-angle glaucoma, bilateral, severe stage: Secondary | ICD-10-CM | POA: Diagnosis not present

## 2021-06-03 DIAGNOSIS — H211X2 Other vascular disorders of iris and ciliary body, left eye: Secondary | ICD-10-CM | POA: Diagnosis not present

## 2021-06-03 DIAGNOSIS — H318 Other specified disorders of choroid: Secondary | ICD-10-CM | POA: Diagnosis not present

## 2021-06-03 DIAGNOSIS — E113293 Type 2 diabetes mellitus with mild nonproliferative diabetic retinopathy without macular edema, bilateral: Secondary | ICD-10-CM | POA: Diagnosis not present

## 2021-06-03 DIAGNOSIS — H3589 Other specified retinal disorders: Secondary | ICD-10-CM | POA: Diagnosis not present

## 2021-06-04 DIAGNOSIS — I6523 Occlusion and stenosis of bilateral carotid arteries: Secondary | ICD-10-CM | POA: Diagnosis not present

## 2021-06-04 DIAGNOSIS — Z9889 Other specified postprocedural states: Secondary | ICD-10-CM | POA: Diagnosis not present

## 2021-06-04 DIAGNOSIS — H401133 Primary open-angle glaucoma, bilateral, severe stage: Secondary | ICD-10-CM | POA: Diagnosis not present

## 2021-06-09 DIAGNOSIS — Z85828 Personal history of other malignant neoplasm of skin: Secondary | ICD-10-CM | POA: Diagnosis not present

## 2021-06-09 DIAGNOSIS — Z872 Personal history of diseases of the skin and subcutaneous tissue: Secondary | ICD-10-CM | POA: Diagnosis not present

## 2021-06-09 DIAGNOSIS — L578 Other skin changes due to chronic exposure to nonionizing radiation: Secondary | ICD-10-CM | POA: Diagnosis not present

## 2021-06-09 DIAGNOSIS — Z859 Personal history of malignant neoplasm, unspecified: Secondary | ICD-10-CM | POA: Diagnosis not present

## 2021-06-09 DIAGNOSIS — L570 Actinic keratosis: Secondary | ICD-10-CM | POA: Diagnosis not present

## 2021-06-11 DIAGNOSIS — H401113 Primary open-angle glaucoma, right eye, severe stage: Secondary | ICD-10-CM | POA: Diagnosis not present

## 2021-06-11 DIAGNOSIS — H2511 Age-related nuclear cataract, right eye: Secondary | ICD-10-CM | POA: Diagnosis not present

## 2021-06-11 DIAGNOSIS — Z09 Encounter for follow-up examination after completed treatment for conditions other than malignant neoplasm: Secondary | ICD-10-CM | POA: Diagnosis not present

## 2021-06-19 DIAGNOSIS — J8410 Pulmonary fibrosis, unspecified: Secondary | ICD-10-CM | POA: Diagnosis not present

## 2021-07-05 DIAGNOSIS — H3582 Retinal ischemia: Secondary | ICD-10-CM | POA: Diagnosis not present

## 2021-07-05 DIAGNOSIS — E113293 Type 2 diabetes mellitus with mild nonproliferative diabetic retinopathy without macular edema, bilateral: Secondary | ICD-10-CM | POA: Diagnosis not present

## 2021-07-08 DIAGNOSIS — E785 Hyperlipidemia, unspecified: Secondary | ICD-10-CM | POA: Diagnosis not present

## 2021-07-08 DIAGNOSIS — H2511 Age-related nuclear cataract, right eye: Secondary | ICD-10-CM | POA: Diagnosis not present

## 2021-07-08 DIAGNOSIS — E1136 Type 2 diabetes mellitus with diabetic cataract: Secondary | ICD-10-CM | POA: Diagnosis not present

## 2021-07-08 DIAGNOSIS — Z7902 Long term (current) use of antithrombotics/antiplatelets: Secondary | ICD-10-CM | POA: Diagnosis not present

## 2021-07-08 DIAGNOSIS — H401113 Primary open-angle glaucoma, right eye, severe stage: Secondary | ICD-10-CM | POA: Diagnosis not present

## 2021-07-08 DIAGNOSIS — E1151 Type 2 diabetes mellitus with diabetic peripheral angiopathy without gangrene: Secondary | ICD-10-CM | POA: Diagnosis not present

## 2021-07-08 DIAGNOSIS — Z7984 Long term (current) use of oral hypoglycemic drugs: Secondary | ICD-10-CM | POA: Diagnosis not present

## 2021-07-08 DIAGNOSIS — I10 Essential (primary) hypertension: Secondary | ICD-10-CM | POA: Diagnosis not present

## 2021-07-08 DIAGNOSIS — Z79899 Other long term (current) drug therapy: Secondary | ICD-10-CM | POA: Diagnosis not present

## 2021-07-20 DIAGNOSIS — J8410 Pulmonary fibrosis, unspecified: Secondary | ICD-10-CM | POA: Diagnosis not present

## 2021-09-07 DIAGNOSIS — L570 Actinic keratosis: Secondary | ICD-10-CM | POA: Diagnosis not present

## 2021-09-21 DIAGNOSIS — L570 Actinic keratosis: Secondary | ICD-10-CM | POA: Diagnosis not present

## 2021-09-23 DIAGNOSIS — C44319 Basal cell carcinoma of skin of other parts of face: Secondary | ICD-10-CM | POA: Diagnosis not present

## 2021-10-05 DIAGNOSIS — L570 Actinic keratosis: Secondary | ICD-10-CM | POA: Diagnosis not present

## 2021-10-08 DIAGNOSIS — H401133 Primary open-angle glaucoma, bilateral, severe stage: Secondary | ICD-10-CM | POA: Diagnosis not present

## 2021-10-08 DIAGNOSIS — Z961 Presence of intraocular lens: Secondary | ICD-10-CM | POA: Diagnosis not present

## 2021-10-12 DIAGNOSIS — E118 Type 2 diabetes mellitus with unspecified complications: Secondary | ICD-10-CM | POA: Diagnosis not present

## 2021-10-12 DIAGNOSIS — I10 Essential (primary) hypertension: Secondary | ICD-10-CM | POA: Diagnosis not present

## 2021-10-12 DIAGNOSIS — D649 Anemia, unspecified: Secondary | ICD-10-CM | POA: Diagnosis not present

## 2021-10-12 DIAGNOSIS — E7849 Other hyperlipidemia: Secondary | ICD-10-CM | POA: Diagnosis not present

## 2021-10-19 DIAGNOSIS — J849 Interstitial pulmonary disease, unspecified: Secondary | ICD-10-CM | POA: Diagnosis not present

## 2021-10-19 DIAGNOSIS — I700 Atherosclerosis of aorta: Secondary | ICD-10-CM | POA: Diagnosis not present

## 2021-10-19 DIAGNOSIS — E785 Hyperlipidemia, unspecified: Secondary | ICD-10-CM | POA: Diagnosis not present

## 2021-10-19 DIAGNOSIS — R972 Elevated prostate specific antigen [PSA]: Secondary | ICD-10-CM | POA: Diagnosis not present

## 2021-10-19 DIAGNOSIS — E1151 Type 2 diabetes mellitus with diabetic peripheral angiopathy without gangrene: Secondary | ICD-10-CM | POA: Diagnosis not present

## 2021-10-19 DIAGNOSIS — Z0000 Encounter for general adult medical examination without abnormal findings: Secondary | ICD-10-CM | POA: Diagnosis not present

## 2021-10-19 DIAGNOSIS — I10 Essential (primary) hypertension: Secondary | ICD-10-CM | POA: Diagnosis not present

## 2021-10-20 DIAGNOSIS — L570 Actinic keratosis: Secondary | ICD-10-CM | POA: Diagnosis not present

## 2021-11-02 DIAGNOSIS — L570 Actinic keratosis: Secondary | ICD-10-CM | POA: Diagnosis not present

## 2021-11-30 DIAGNOSIS — H401133 Primary open-angle glaucoma, bilateral, severe stage: Secondary | ICD-10-CM | POA: Diagnosis not present

## 2021-11-30 DIAGNOSIS — Z961 Presence of intraocular lens: Secondary | ICD-10-CM | POA: Diagnosis not present

## 2021-12-09 DIAGNOSIS — J8410 Pulmonary fibrosis, unspecified: Secondary | ICD-10-CM | POA: Diagnosis not present

## 2021-12-09 DIAGNOSIS — J849 Interstitial pulmonary disease, unspecified: Secondary | ICD-10-CM | POA: Diagnosis not present

## 2021-12-14 DIAGNOSIS — Z859 Personal history of malignant neoplasm, unspecified: Secondary | ICD-10-CM | POA: Diagnosis not present

## 2021-12-14 DIAGNOSIS — L814 Other melanin hyperpigmentation: Secondary | ICD-10-CM | POA: Diagnosis not present

## 2021-12-14 DIAGNOSIS — D485 Neoplasm of uncertain behavior of skin: Secondary | ICD-10-CM | POA: Diagnosis not present

## 2021-12-14 DIAGNOSIS — D492 Neoplasm of unspecified behavior of bone, soft tissue, and skin: Secondary | ICD-10-CM | POA: Diagnosis not present

## 2021-12-14 DIAGNOSIS — Z872 Personal history of diseases of the skin and subcutaneous tissue: Secondary | ICD-10-CM | POA: Diagnosis not present

## 2021-12-14 DIAGNOSIS — L570 Actinic keratosis: Secondary | ICD-10-CM | POA: Diagnosis not present

## 2021-12-14 DIAGNOSIS — Z85828 Personal history of other malignant neoplasm of skin: Secondary | ICD-10-CM | POA: Diagnosis not present

## 2021-12-14 DIAGNOSIS — L578 Other skin changes due to chronic exposure to nonionizing radiation: Secondary | ICD-10-CM | POA: Diagnosis not present

## 2021-12-14 DIAGNOSIS — D045 Carcinoma in situ of skin of trunk: Secondary | ICD-10-CM | POA: Diagnosis not present

## 2021-12-19 ENCOUNTER — Ambulatory Visit (INDEPENDENT_AMBULATORY_CARE_PROVIDER_SITE_OTHER): Payer: Medicare HMO

## 2021-12-19 ENCOUNTER — Ambulatory Visit
Admission: EM | Admit: 2021-12-19 | Discharge: 2021-12-19 | Disposition: A | Discharge status: Home / Self Care | Payer: Medicare HMO | Attending: Family Medicine | Admitting: Family Medicine

## 2021-12-19 DIAGNOSIS — Z79899 Other long term (current) drug therapy: Secondary | ICD-10-CM | POA: Diagnosis not present

## 2021-12-19 DIAGNOSIS — J439 Emphysema, unspecified: Secondary | ICD-10-CM | POA: Insufficient documentation

## 2021-12-19 DIAGNOSIS — R918 Other nonspecific abnormal finding of lung field: Secondary | ICD-10-CM | POA: Diagnosis not present

## 2021-12-19 DIAGNOSIS — J849 Interstitial pulmonary disease, unspecified: Secondary | ICD-10-CM | POA: Insufficient documentation

## 2021-12-19 DIAGNOSIS — J101 Influenza due to other identified influenza virus with other respiratory manifestations: Secondary | ICD-10-CM | POA: Diagnosis not present

## 2021-12-19 DIAGNOSIS — Z1152 Encounter for screening for COVID-19: Secondary | ICD-10-CM | POA: Insufficient documentation

## 2021-12-19 LAB — RESP PANEL BY RT-PCR (FLU A&B, COVID) ARPGX2
Influenza A by PCR: POSITIVE — AB
Influenza B by PCR: NEGATIVE
SARS Coronavirus 2 by RT PCR: NEGATIVE

## 2021-12-19 MED ORDER — LEVOFLOXACIN 750 MG PO TABS: 750.0000 mg | ORAL_TABLET | Freq: Every day | ORAL | 0 refills | Status: AC

## 2021-12-19 MED ORDER — OSELTAMIVIR PHOSPHATE 75 MG PO CAPS: 75.0000 mg | ORAL_CAPSULE | Freq: Two times a day (BID) | ORAL | 0 refills | Status: DC

## 2021-12-19 NOTE — Discharge Instructions (Signed)
Medication as prescribed.  Follow up with Pulmonary given xray findings.  Take care  Dr. Lacinda Axon

## 2021-12-19 NOTE — ED Triage Notes (Signed)
Pt is with his daughter   Pt c/o chest congestion, sore throat, headahce x4days  Pt is worried about possible pneumonia  Pt asks for a covid and flu test.

## 2021-12-19 NOTE — ED Provider Notes (Signed)
[facility]    CSN: [id number] Arrival date & time: 12/19/21  0820      History   Chief Complaint Chief Complaint  Patient presents with   Headache   Nasal Congestion    HPI  76 year old male with interstitial lung disease and emphysema presents for evaluation of respiratory symptoms.  Patient reports that he has had symptoms for the past few days.  Reports chest congestion, sore throat, headache, and cough.  He reports chest tightness and wheezing.  He has a history of pneumonia.  He is concerned about this.  He is followed by pulmonology.  He is not on any current medication regarding interstitial lung disease and emphysema.  No reported sick contacts.  No relieving factors.  No other complaints.  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   Malnutrition of moderate degree 11/28/2018   Acute hypoxemic respiratory failure ([place]) 11/28/2018   Mediastinal adenopathy 11/27/2018   Interstitial lung disease ([place])    Hypokalemia    Type 2 diabetes mellitus with hyperglycemia, without long-term current use of insulin (HCC)    PAD (peripheral artery disease) ([place]) 12/13/2016   Hypertension 10/07/2016   Diabetes mellitus without complication ([place]) 15/17/6160   Hyperlipemia 10/07/2016   Atherosclerosis of native arteries of extremity with intermittent claudication ([address]) 10/07/2016    Past Surgical History:  Procedure Laterality Date   COLONOSCOPY N/A 06/02/2014   Procedure: COLONOSCOPY;  Surgeon: Josefine Class, MD;  Location: [facility] ENDOSCOPY;  Service: Endoscopy;   Laterality: N/A;   COLONOSCOPY WITH PROPOFOL N/A 10/01/2019   Procedure: COLONOSCOPY WITH PROPOFOL;  Surgeon: Lesly Rubenstein, MD;  Location: [facility] ENDOSCOPY;  Service: Endoscopy;  Laterality: N/A;   ESOPHAGOGASTRODUODENOSCOPY (EGD) WITH PROPOFOL N/A 10/01/2019   Procedure: ESOPHAGOGASTRODUODENOSCOPY (EGD) WITH PROPOFOL;  Surgeon: Lesly Rubenstein, MD;  Location: [facility] ENDOSCOPY;  Service: Endoscopy;  Laterality: N/A;   HERNIA REPAIR     INGUINAL HERNIA REPAIR Left 06/28/2016   Procedure: HERNIA REPAIR INGUINAL ADULT WITH MESH;  Surgeon: Leonie Green, MD;  Location: [facility] ORS;  Service: General;  Laterality: Left;   LOWER EXTREMITY ANGIOGRAPHY Right 10/24/2016   Procedure: Lower Extremity Angiography;  Surgeon: Algernon Huxley, MD;  Location: [facility];  Service: Cardiovascular;  Laterality: Right;       Home Medications    Prior to Admission medications   Medication Sig Start Date End Date Taking? Authorizing Provider  aspirin 81 MG tablet Take 81 mg by mouth daily.   Yes [provider]  atorvastatin (LIPITOR) 80 MG tablet Take 1 tablet by mouth daily. 03/19/20  Yes [provider]  clopidogrel (PLAVIX) 75 MG tablet TAKE 1 TABLET BY MOUTH EVERY DAY 10/11/17  Yes Dew, Erskine Squibb, MD  dorzolamide (TRUSOPT) 2 % ophthalmic solution Place 1 drop into both eyes 2 (two) times daily.   Yes [provider]  empagliflozin (JARDIANCE) 25 MG TABS tablet Take 25 mg by mouth daily.   Yes [provider]  glipiZIDE (GLUCOTROL)  10 MG tablet Take 10 mg by mouth daily before breakfast.   Yes [provider]  hydrochlorothiazide (HYDRODIURIL) 25 MG tablet Take 25 mg by mouth daily.   Yes [provider]  ipratropium-albuterol (DUONEB) 0.5-2.5 (3) MG/3ML SOLN Take 3 mLs by nebulization 2 (two) times daily with breakfast and lunch.   Yes [provider]  levofloxacin (LEVAQUIN) 750 MG tablet Take 1 tablet (750 mg total) by mouth daily.  12/19/21  Yes Juliah Scadden G, DO  lisinopril (ZESTRIL) 40 MG tablet Take 40 mg by mouth daily.   Yes [provider]  metFORMIN (GLUCOPHAGE) 1000 MG tablet Take 1,000 mg by mouth daily with breakfast.   Yes [provider]  mirtazapine (REMERON) 15 MG tablet Take 1 tablet (15 mg total) by mouth at bedtime. 12/03/18  Yes Ezekiel Slocumb, DO  Multiple Vitamin (MULTIVITAMIN WITH MINERALS) TABS tablet Take 1 tablet by mouth daily. 12/04/18  Yes Nicole Kindred A, DO  oseltamivir (TAMIFLU) 75 MG capsule Take 1 capsule (75 mg total) by mouth every 12 (twelve) hours. 12/19/21  Yes Seleen Walter G, DO  pioglitazone (ACTOS) 15 MG tablet Take 15 mg by mouth daily.   Yes [provider]  timolol (TIMOPTIC) 0.5 % ophthalmic solution Place 1 drop into both eyes 2 (two) times daily.   Yes [provider]    Family History Family History  Problem Relation Age of Onset   Hypertension Mother    Hypertension Father    Cancer Father    Breast cancer Sister    Cancer Sister     Social History Social History   Tobacco Use   Smoking status: Former    Packs/day: 2.00    Types: Cigarettes    Quit date: 04/25/2011    Years since quitting: 10.6   Smokeless tobacco: Never  Vaping Use   Vaping Use: Never used  Substance Use Topics   Alcohol use: Yes    Alcohol/week: 5.0 standard drinks of alcohol    Types: 5 Cans of beer per week    Comment: weekly    Drug use: No     Allergies   Patient has no known allergies.   Review of Systems Review of Systems Per HPI  Physical Exam Triage Vital Signs ED Triage Vitals  Enc Vitals Group     BP 12/19/21 0851 133/80     Pulse Rate 12/19/21 0851 72     Resp 12/19/21 0851 18     Temp 12/19/21 0851 97.8 F (36.6 C)     Temp Source 12/19/21 0851 Oral     SpO2 12/19/21 0851 98 %     Weight 12/19/21 0848 159 lb (72.1 kg)     Height 12/19/21 0848 '5\' 5"'$  (1.651 m)     Head Circumference --      Peak Flow --      Pain Score  12/19/21 0848 0     Pain Loc --      Pain Edu? --      Excl. in [place]? --    Updated Vital Signs BP 133/80 (BP Location: Left Arm)   Pulse 72   Temp 97.8 F (36.6 C) (Oral)   Resp 18   Ht '5\' 5"'$  (1.651 m)   Wt 72.1 kg   SpO2 98%   BMI 26.46 kg/m   Visual Acuity Right Eye Distance:   Left Eye Distance:   Bilateral Distance:    Right Eye Near:   Left Eye Near:  Bilateral Near:     Physical Exam Vitals and nursing note reviewed.  Constitutional:      General: He is not in acute distress. HENT:     Head: Normocephalic and atraumatic.     Mouth/Throat:     Pharynx: Oropharynx is clear.  Eyes:     General:        Right eye: No discharge.        Left eye: No discharge.     Conjunctiva/sclera: Conjunctivae normal.  Cardiovascular:     Rate and Rhythm: Normal rate and regular rhythm.  Pulmonary:     Effort: Pulmonary effort is normal.     Breath sounds: Wheezing present.  Neurological:     Mental Status: He is alert.  Psychiatric:        Mood and Affect: Mood normal.        Behavior: Behavior normal.      UC Treatments / Results  Labs (all labs ordered are listed, but only abnormal results are displayed) Labs Reviewed  RESP PANEL BY RT-PCR (FLU A&B, COVID) ARPGX2 - Abnormal; Notable for the following components:      Result Value   Influenza A by PCR POSITIVE (*)    All other components within normal limits    EKG   Radiology DG Chest 2 View  Result Date: 12/19/2021 CLINICAL DATA:  Emphysema and interstitial lung disease. EXAM: CHEST - 2 VIEW COMPARISON:  12/02/2018 FINDINGS: Lungs are hyperexpanded. Interstitial markings are diffusely coarsened with chronic features. Opacity in the medial right lung base is new in the interval. Cardiopericardial silhouette is at upper limits of normal for size. Bones are diffusely demineralized. IMPRESSION: Emphysema with new opacity in the medial right lung base, potentially a fat pad. As pulmonary parenchymal lesion is  not excluded, consider CT chest without contrast to further evaluate. Electronically Signed   By: Misty Stanley M.D.   On: 12/19/2021 09:37    Procedures Procedures (including critical care time)  Medications Ordered in UC Medications - No data to display  Initial Impression / Assessment and Plan / UC Course  I have reviewed the triage vital signs and the nursing notes.  Pertinent labs & imaging results that were available during my care of the patient were reviewed by me and considered in my medical decision making (see chart for details).    76 year old male presents with respiratory symptoms.  Influenza positive.  Treating with Tamiflu.  Chest x-ray was done given patient's complaints as well as his a long history.  Chest x-ray was independently reviewed by me.  Interpretation: Opacity in the right lung base. I am covering for commune acquired pneumonia with Levaquin.  Advised to follow-up with pulmonology.  Will likely need CT.  Final Clinical Impressions(s) / UC Diagnoses   Final diagnoses:  Influenza A  Opacity of lung on imaging study     Discharge Instructions      Medication as prescribed.  Follow up with Pulmonary given xray findings.  Take care  Dr. Lacinda Axon    ED Prescriptions     Medication Sig Dispense Auth. Provider   levofloxacin (LEVAQUIN) 750 MG tablet Take 1 tablet (750 mg total) by mouth daily. 7 tablet Cristan Scherzer G, DO   oseltamivir (TAMIFLU) 75 MG capsule Take 1 capsule (75 mg total) by mouth every 12 (twelve) hours. 10 capsule Coral Spikes, DO      PDMP not reviewed this encounter.   Coral Spikes, DO 12/19/21 1005

## 2021-12-27 ENCOUNTER — Other Ambulatory Visit: Payer: Self-pay | Admitting: Pulmonary Disease

## 2021-12-27 DIAGNOSIS — J849 Interstitial pulmonary disease, unspecified: Secondary | ICD-10-CM

## 2021-12-31 ENCOUNTER — Ambulatory Visit
Admission: RE | Admit: 2021-12-31 | Discharge: 2021-12-31 | Disposition: A | Discharge status: Home / Self Care | Payer: Medicare HMO | Source: Ambulatory Visit | Attending: Pulmonary Disease | Admitting: Pulmonary Disease

## 2021-12-31 DIAGNOSIS — J849 Interstitial pulmonary disease, unspecified: Secondary | ICD-10-CM | POA: Diagnosis not present

## 2021-12-31 DIAGNOSIS — J929 Pleural plaque without asbestos: Secondary | ICD-10-CM | POA: Diagnosis not present

## 2021-12-31 DIAGNOSIS — J432 Centrilobular emphysema: Secondary | ICD-10-CM | POA: Diagnosis not present

## 2021-12-31 DIAGNOSIS — K449 Diaphragmatic hernia without obstruction or gangrene: Secondary | ICD-10-CM | POA: Diagnosis not present

## 2021-12-31 DIAGNOSIS — J84112 Idiopathic pulmonary fibrosis: Secondary | ICD-10-CM | POA: Diagnosis not present

## 2022-01-14 DIAGNOSIS — H31401 Unspecified choroidal detachment, right eye: Secondary | ICD-10-CM | POA: Diagnosis not present

## 2022-01-21 DIAGNOSIS — H31401 Unspecified choroidal detachment, right eye: Secondary | ICD-10-CM | POA: Diagnosis not present

## 2022-01-21 DIAGNOSIS — H4440 Unspecified hypotony of eye: Secondary | ICD-10-CM | POA: Diagnosis not present

## 2022-03-04 IMAGING — CT CT CHEST W/O CM
2 of 4 series · 15 of 36 positions shown, 18 images · non-contrast
Comparison: 11/25/2018

CLINICAL DATA: Pneumonia October 2019, previous tobacco abuse

EXAM:
CT CHEST WITHOUT CONTRAST
TECHNIQUE: Multidetector CT imaging of the chest was performed following the
standard protocol without IV contrast.

[Series 2: thorax · axial · 0.70mm/px · z∈[-748,-460]mm · 12 of 168 slices shown, 15 images]
[im 12/168  mediastinal]
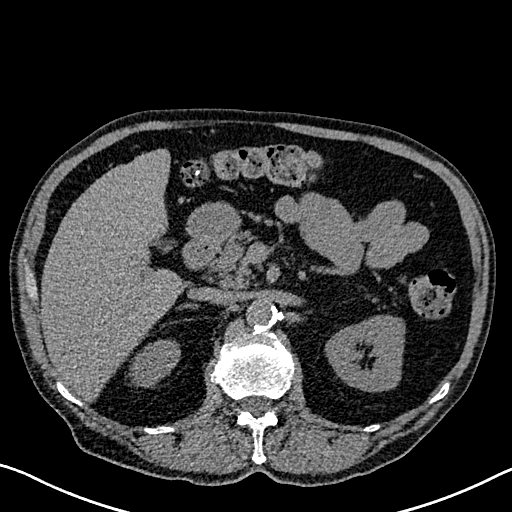
[im 12/168  lung]
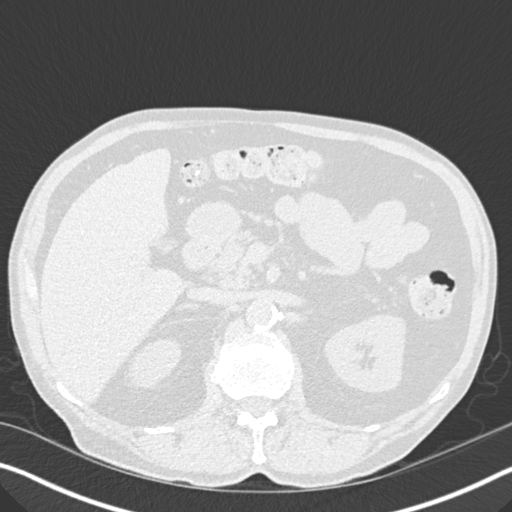
[im 24/168  lung]
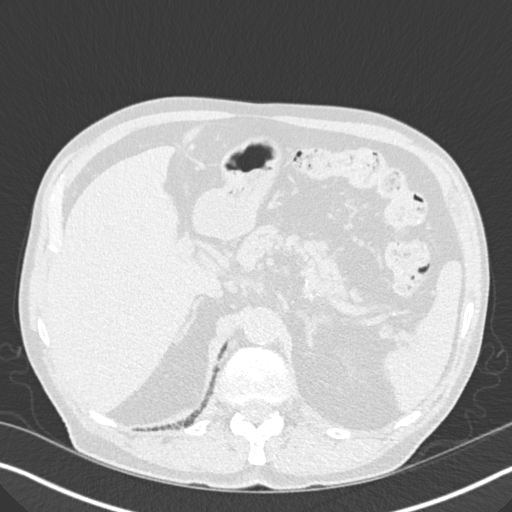
[im 36/168  lung]
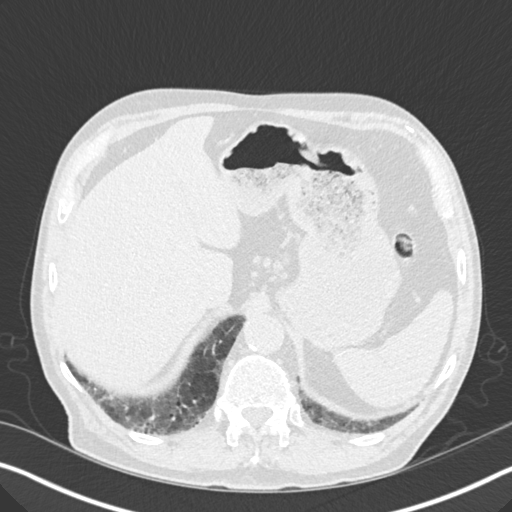
[im 48/168  lung]
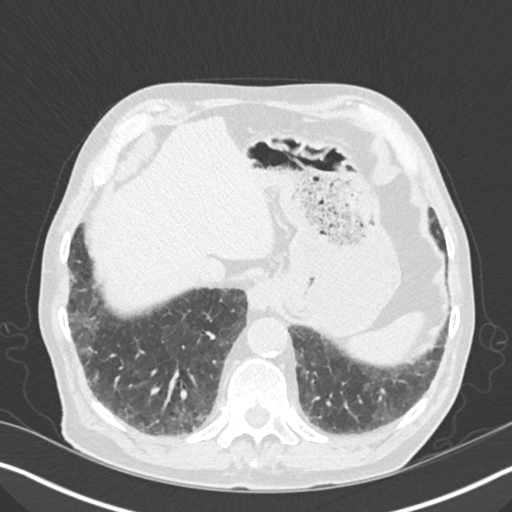
[im 60/168  mediastinal]
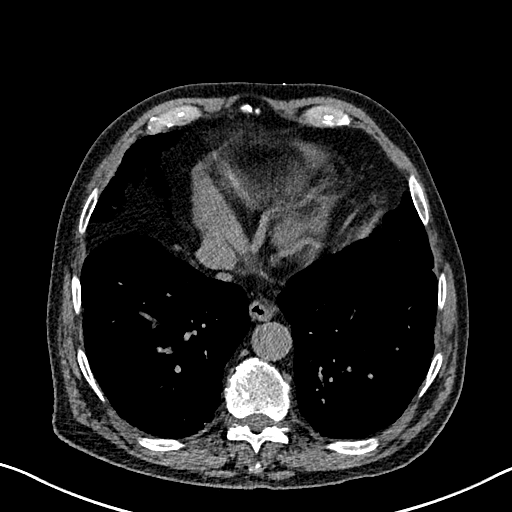
[im 60/168  lung]
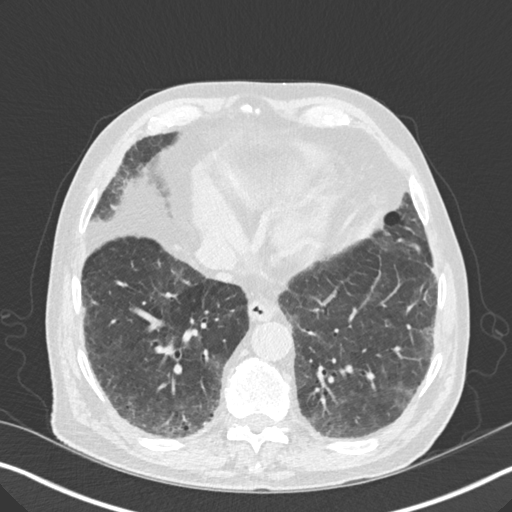
[im 72/168  lung]
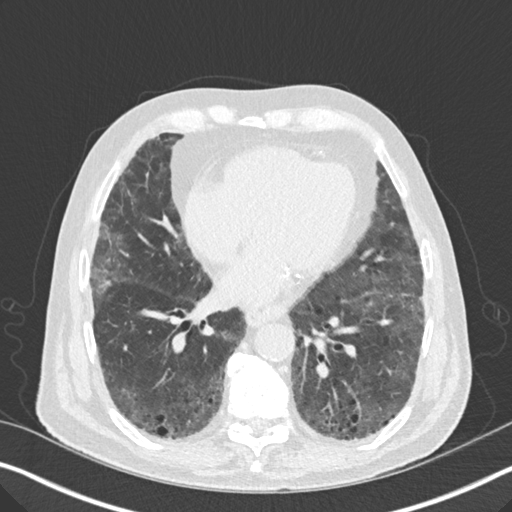
[im 96/168  lung]
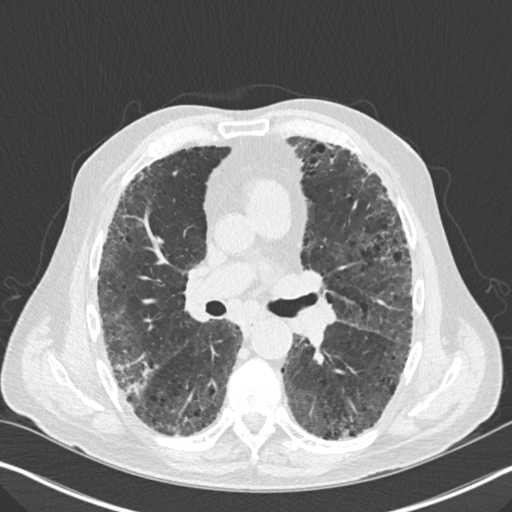
[im 108/168  lung]
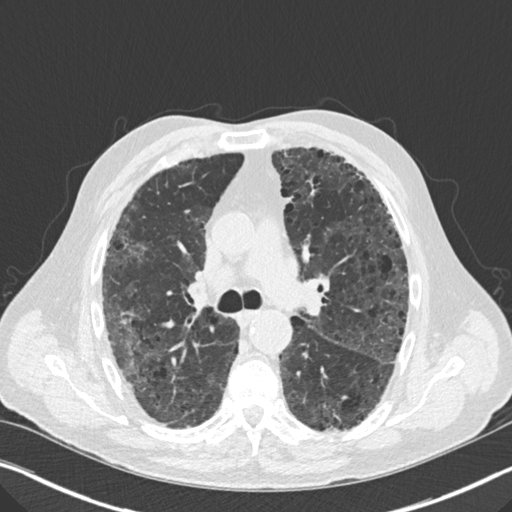
[im 120/168  mediastinal]
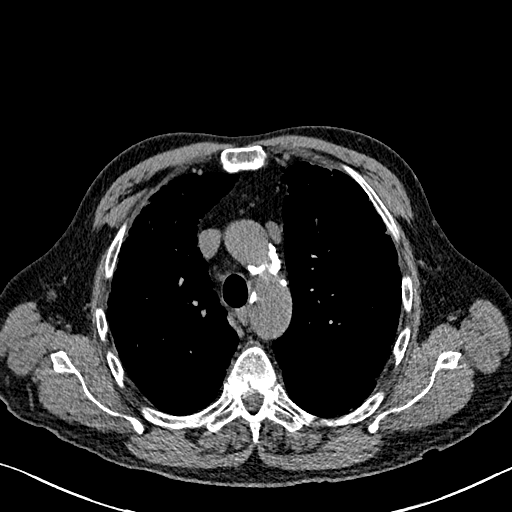
[im 120/168  lung]
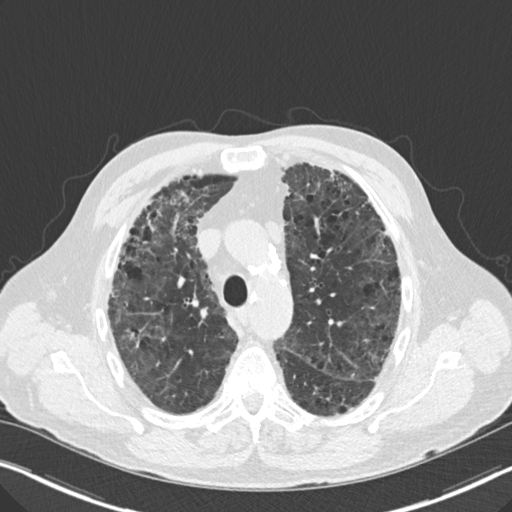
[im 132/168  lung]
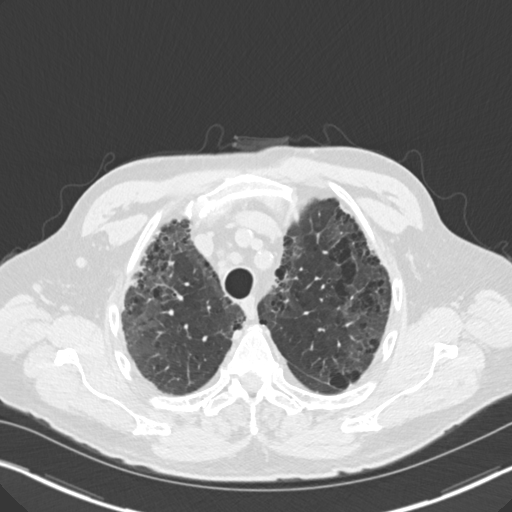
[im 144/168  lung]
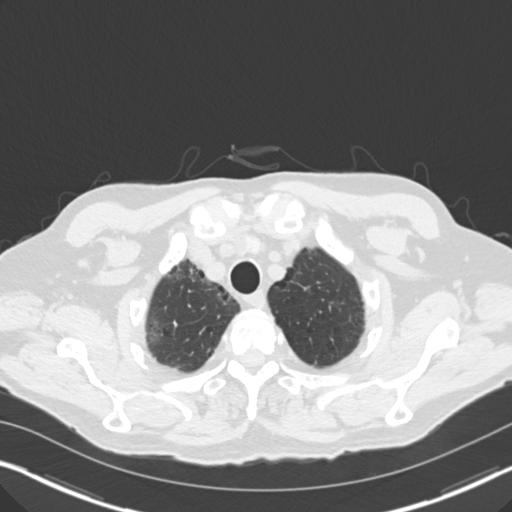
[im 156/168  lung]
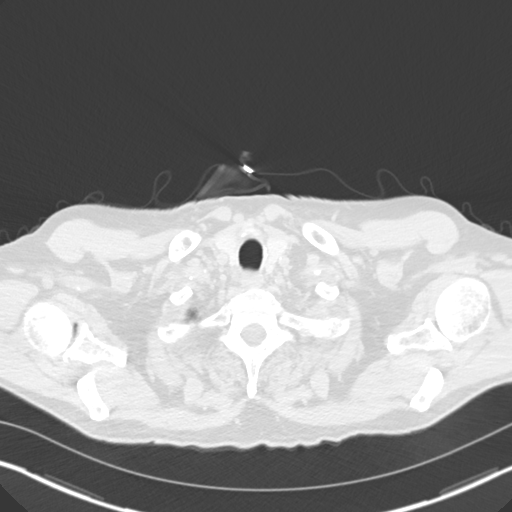

[Series 5: coronal · coronal · 0.65mm/px · 3 of 146 slices shown]
[im 30/146  lung]
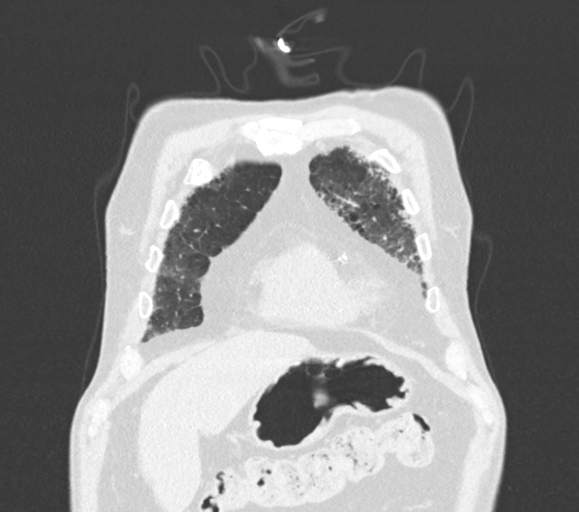
[im 59/146  lung]
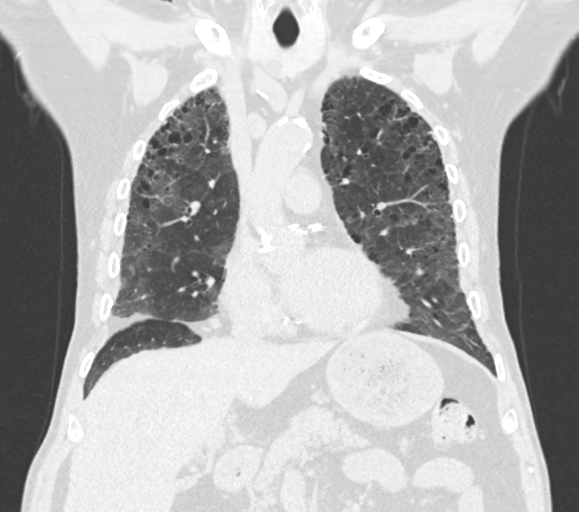
[im 88/146  lung]
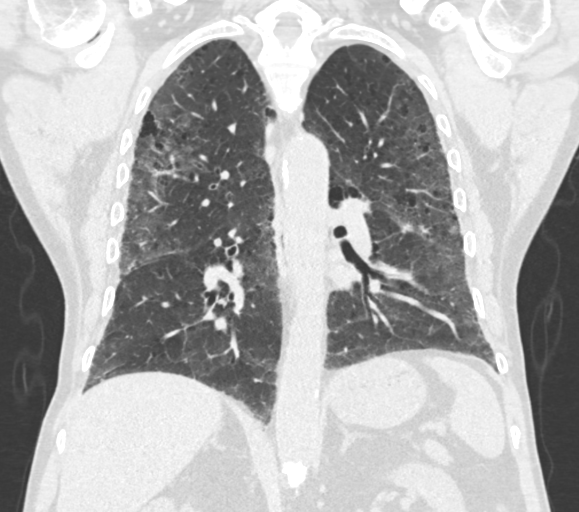

[15 of 36 positions shown; findings below may reference images not displayed]

FINDINGS: Cardiovascular: Unenhanced imaging of the heart and great vessels
demonstrates no pericardial effusion. There is extensive
atherosclerosis of the coronary vasculature. Normal caliber of the
thoracic aorta, with extensive atherosclerosis throughout the aortic
arch and descending thoracic aorta.

Mediastinum/Nodes: Continued mediastinal and hilar adenopathy,
though overall decreased size of the lymph nodes seen previously.
Index lymph node in the precarinal region measures 17 mm, stable.
Index lymph node in the subcarinal region measures 14 mm image 81/2,
previously having measured 20 mm. There is a 14 mm hypodensity
inferior aspect right lobe thyroid, previously measuring
approximately 9 mm.

The trachea and esophagus are unremarkable.

Lungs/Pleura: There are continued emphysematous changes, not
significantly changed since prior exam. Diffuse interlobular septal
thickening again noted, without significant change. There is been
significant improvement in the peripheral ground-glass opacity seen
on prior exam, with mild residual subpleural ground-glass densities
at the lung bases on today's exam. No effusion or pneumothorax. The
central airways are patent.

Upper Abdomen: No acute abnormality.

Musculoskeletal: No acute or destructive bony lesions. Reconstructed
images demonstrate no additional findings.
IMPRESSION: 1. Continued findings of extensive interstitial lung disease and
emphysema. Persistent but improved ground-glass attenuation at the
lung bases consistent with alveolitis.
2. Persistent but decreased mediastinal and hilar adenopathy as
above.
3. Incidental 14 mm hypodensity right lobe thyroid, not
significantly changed by my measurements since prior exam. Not
clinically significant; no follow-up imaging recommended. (Ref: [HOSPITAL]. [DATE]): 143-50).
4. Aortic Atherosclerosis (YBQ1C-DHB.B) and Emphysema (YBQ1C-8RV.S).
5. Extensive coronary artery atherosclerosis.

## 2022-05-13 DIAGNOSIS — H31401 Unspecified choroidal detachment, right eye: Secondary | ICD-10-CM | POA: Diagnosis not present

## 2022-06-12 ENCOUNTER — Ambulatory Visit
Admission: EM | Admit: 2022-06-12 | Discharge: 2022-06-12 | Disposition: A | Discharge status: Home / Self Care | Payer: Medicare HMO | Attending: Emergency Medicine | Admitting: Emergency Medicine

## 2022-06-12 ENCOUNTER — Encounter: Payer: Self-pay | Admitting: Emergency Medicine

## 2022-06-12 DIAGNOSIS — E1151 Type 2 diabetes mellitus with diabetic peripheral angiopathy without gangrene: Secondary | ICD-10-CM | POA: Insufficient documentation

## 2022-06-12 DIAGNOSIS — H409 Unspecified glaucoma: Secondary | ICD-10-CM | POA: Insufficient documentation

## 2022-06-12 DIAGNOSIS — I10 Essential (primary) hypertension: Secondary | ICD-10-CM | POA: Diagnosis not present

## 2022-06-12 DIAGNOSIS — Z7984 Long term (current) use of oral hypoglycemic drugs: Secondary | ICD-10-CM | POA: Diagnosis not present

## 2022-06-12 DIAGNOSIS — J45909 Unspecified asthma, uncomplicated: Secondary | ICD-10-CM | POA: Diagnosis not present

## 2022-06-12 DIAGNOSIS — Z1152 Encounter for screening for COVID-19: Secondary | ICD-10-CM | POA: Diagnosis not present

## 2022-06-12 DIAGNOSIS — J069 Acute upper respiratory infection, unspecified: Secondary | ICD-10-CM

## 2022-06-12 DIAGNOSIS — E785 Hyperlipidemia, unspecified: Secondary | ICD-10-CM | POA: Diagnosis not present

## 2022-06-12 DIAGNOSIS — B9789 Other viral agents as the cause of diseases classified elsewhere: Secondary | ICD-10-CM | POA: Insufficient documentation

## 2022-06-12 LAB — SARS CORONAVIRUS 2 BY RT PCR: SARS Coronavirus 2 by RT PCR: NEGATIVE

## 2022-06-12 MED ORDER — IPRATROPIUM BROMIDE 0.06 % NA SOLN: 2.0000 | Freq: Four times a day (QID) | NASAL | 12 refills | Status: AC

## 2022-06-12 NOTE — ED Provider Notes (Signed)
[facility]    CSN: [id number] Arrival date & time: 06/12/22  0856      History   Chief Complaint Chief Complaint  Patient presents with   Nasal Congestion   Headache    HPI Gerald Clark is a 77 y.o. male.   HPI  77 year old male with a past medical history significant for type 2 diabetes, asthma, basal cell carcinoma, glaucoma, GERD, hypertension, hyperlipidemia, and PVD presents for evaluation of 1 day worth of nasal congestion and headache.  He denies any associated body aches, fever, nasal discharge, sore throat, sneezing, ear pain, or cough.  No known sick contacts or recent travel.  Past Medical History:  Diagnosis Date   Arthritis    Asthma    childhood asthma   Cancer (HCC)    Basal Cell Skin Cancer   Diabetes mellitus without complication (HCC)    GERD (gastroesophageal reflux disease)    Glaucoma    History of chicken pox    Hyperlipemia    Hypertension    Peripheral vascular disease (HCC)     Patient Active Problem List   Diagnosis Date Noted   SIADH (syndrome of inappropriate ADH production) (HCC) 12/04/2018   Malnutrition of moderate degree 11/28/2018   Mediastinal adenopathy 11/27/2018   Interstitial lung disease (HCC)    Type 2 diabetes mellitus with hyperglycemia, without long-term current use of insulin (HCC)    PAD (peripheral artery disease) (HCC) 12/13/2016   Hypertension 10/07/2016   Hyperlipemia 10/07/2016   Atherosclerosis of native arteries of extremity with intermittent claudication (HCC) 10/07/2016    Past Surgical History:  Procedure Laterality Date   COLONOSCOPY N/A 06/02/2014   Procedure: COLONOSCOPY;  Surgeon: Elnita Maxwell, MD;  Location: [facility] ENDOSCOPY;  Service: Endoscopy;  Laterality: N/A;   COLONOSCOPY WITH PROPOFOL N/A 10/01/2019   Procedure: COLONOSCOPY WITH PROPOFOL;  Surgeon: Regis Bill, MD;  Location: [facility] ENDOSCOPY;  Service: Endoscopy;  Laterality: N/A;   ESOPHAGOGASTRODUODENOSCOPY (EGD)  WITH PROPOFOL N/A 10/01/2019   Procedure: ESOPHAGOGASTRODUODENOSCOPY (EGD) WITH PROPOFOL;  Surgeon: Regis Bill, MD;  Location: [facility] ENDOSCOPY;  Service: Endoscopy;  Laterality: N/A;   HERNIA REPAIR     INGUINAL HERNIA REPAIR Left 06/28/2016   Procedure: HERNIA REPAIR INGUINAL ADULT WITH MESH;  Surgeon: Nadeen Landau, MD;  Location: [facility] ORS;  Service: General;  Laterality: Left;   LOWER EXTREMITY ANGIOGRAPHY Right 10/24/2016   Procedure: Lower Extremity Angiography;  Surgeon: Annice Needy, MD;  Location: [facility] INVASIVE CV LAB;  Service: Cardiovascular;  Laterality: Right;       Home Medications    Prior to Admission medications   Medication Sig Start Date End Date Taking? Authorizing Provider  ipratropium (ATROVENT) 0.06 % nasal spray Place 2 sprays into both nostrils 4 (four) times daily. 06/12/22  Yes Becky Augusta, NP  aspirin 81 MG tablet Take 81 mg by mouth daily.    [provider]  atorvastatin (LIPITOR) 80 MG tablet Take 1 tablet by mouth daily. 03/19/20   [provider]  clopidogrel (PLAVIX) 75 MG tablet TAKE 1 TABLET BY MOUTH EVERY DAY 10/11/17   Annice Needy, MD  dorzolamide (TRUSOPT) 2 % ophthalmic solution Place 1 drop into both eyes 2 (two) times daily.    [provider]  empagliflozin (JARDIANCE) 25 MG TABS tablet Take 25 mg by mouth daily.    [provider]  glipiZIDE (GLUCOTROL) 10 MG tablet Take 10 mg by mouth daily before breakfast.    [provider]  hydrochlorothiazide (HYDRODIURIL) 25 MG tablet Take 25 mg by mouth daily.    [provider]  ipratropium-albuterol (DUONEB) 0.5-2.5 (3) MG/3ML SOLN Take 3 mLs by nebulization 2 (two) times daily with breakfast and lunch.    [provider]  levofloxacin (LEVAQUIN) 750 MG tablet Take 1 tablet (750 mg total) by mouth daily. 12/19/21   Tommie Sams, DO  lisinopril (ZESTRIL) 40 MG tablet Take 40 mg by mouth daily.    [provider]  metFORMIN  (GLUCOPHAGE) 1000 MG tablet Take 1,000 mg by mouth daily with breakfast.    [provider]  mirtazapine (REMERON) 15 MG tablet Take 1 tablet (15 mg total) by mouth at bedtime. 12/03/18   Pennie Banter, DO  Multiple Vitamin (MULTIVITAMIN WITH MINERALS) TABS tablet Take 1 tablet by mouth daily. 12/04/18   Esaw Grandchild A, DO  pioglitazone (ACTOS) 15 MG tablet Take 15 mg by mouth daily.    [provider]  timolol (TIMOPTIC) 0.5 % ophthalmic solution Place 1 drop into both eyes 2 (two) times daily.    [provider]    Family History Family History  Problem Relation Age of Onset   Hypertension Mother    Hypertension Father    Cancer Father    Breast cancer Sister    Cancer Sister     Social History Social History   Tobacco Use   Smoking status: Former    Packs/day: 2    Types: Cigarettes    Quit date: 04/25/2011    Years since quitting: 11.1   Smokeless tobacco: Never  Vaping Use   Vaping Use: Never used  Substance Use Topics   Alcohol use: Yes    Alcohol/week: 5.0 standard drinks of alcohol    Types: 5 Cans of beer per week    Comment: weekly    Drug use: No     Allergies   Patient has no known allergies.   Review of Systems Review of Systems  Constitutional:  Negative for fever.  HENT:  Positive for congestion. Negative for ear pain, rhinorrhea and sore throat.   Respiratory:  Negative for cough, shortness of breath and wheezing.   Gastrointestinal:  Negative for diarrhea, nausea and vomiting.  Musculoskeletal:  Negative for arthralgias and myalgias.  Neurological:  Positive for headaches.     Physical Exam Triage Vital Signs ED Triage Vitals  Enc Vitals Group     BP 06/12/22 0907 (!) 143/70     Pulse Rate 06/12/22 0907 99     Resp 06/12/22 0907 15     Temp 06/12/22 0907 98.3 F (36.8 C)     Temp Source 06/12/22 0907 Oral     SpO2 06/12/22 0907 95 %     Weight 06/12/22 0904 160 lb (72.6 kg)     Height 06/12/22 0904 5\' 5"   (1.651 m)     Head Circumference --      Peak Flow --      Pain Score 06/12/22 0904 4     Pain Loc --      Pain Edu? --      Excl. in [place]? --    No data found.  Updated Vital Signs BP (!) 143/70 (BP Location: Left Arm)   Pulse 99   Temp 98.3 F (36.8 C) (Oral)   Resp 15   Ht 5\' 5"  (1.651 m)   Wt 160 lb (72.6 kg)   SpO2 95%   BMI 26.63 kg/m   Visual  Acuity Right Eye Distance:   Left Eye Distance:   Bilateral Distance:    Right Eye Near:   Left Eye Near:    Bilateral Near:     Physical Exam Vitals and nursing note reviewed.  Constitutional:      Appearance: Normal appearance. He is not ill-appearing.  HENT:     Head: Normocephalic and atraumatic.     Right Ear: Tympanic membrane, ear canal and external ear normal. There is no impacted cerumen.     Left Ear: Tympanic membrane, ear canal and external ear normal. There is no impacted cerumen.     Nose: Congestion and rhinorrhea present.     Comments: Nasal mucosa is erythematous and mildly edematous with yellow discharge in both nares.    Mouth/Throat:     Mouth: Mucous membranes are moist.     Pharynx: Oropharynx is clear. No oropharyngeal exudate or posterior oropharyngeal erythema.  Cardiovascular:     Rate and Rhythm: Normal rate and regular rhythm.     Pulses: Normal pulses.     Heart sounds: Normal heart sounds. No murmur heard.    No friction rub. No gallop.  Pulmonary:     Effort: Pulmonary effort is normal.     Breath sounds: Normal breath sounds. No wheezing, rhonchi or rales.  Musculoskeletal:     Cervical back: Normal range of motion and neck supple.  Lymphadenopathy:     Cervical: No cervical adenopathy.  Skin:    General: Skin is warm and dry.     Capillary Refill: Capillary refill takes less than 2 seconds.     Findings: No rash.  Neurological:     General: No focal deficit present.     Mental Status: He is alert and oriented to person, place, and time.      UC Treatments / Results   Labs (all labs ordered are listed, but only abnormal results are displayed) Labs Reviewed  SARS CORONAVIRUS 2 BY RT PCR    EKG   Radiology No results found.  Procedures Procedures (including critical care time)  Medications Ordered in UC Medications - No data to display  Initial Impression / Assessment and Plan / UC Course  I have reviewed the triage vital signs and the nursing notes.  Pertinent labs & imaging results that were available during my care of the patient were reviewed by me and considered in my medical decision making (see chart for details).   Patient is a nontoxic-appearing 77 year old male presenting for evaluation of headache and nasal congestion without any other associated upper or lower respiratory symptoms.  He also denies GI symptoms.  No known sick contacts.  He does have a history of asthma but he has not had any shortness of breath or wheezing.  His room air oxygen saturation is 95% with a respiratory rate of 15.  He is afebrile at 98.3.  He does have nasal congestion on exam with yellow nasal discharge but the rest of his upper respiratory tract is unremarkable.  Differential diagnosis include COVID, influenza, and viral URI.  I feel it is less likely influenza as patient has not had a fever.  I will order a COVID PCR given patient's comorbidities.  COVID PCR is negative.  I will discharge patient home with a diagnosis of viral URI.  I will prescribe Atrovent nasal spray that the patient use for the nasal congestion and he can use over-the-counter Tylenol and/or ibuprofen as needed for pain.  Any new symptoms develop he can  return for reevaluation or see his PCP.   Final Clinical Impressions(s) / UC Diagnoses   Final diagnoses:  Viral upper respiratory tract infection     Discharge Instructions      Your COVID test was negative but your exam is consistent with an upper respiratory infection.  Is most likely viral in nature.  Please use  over-the-counter Tylenol and/or ibuprofen according to the package instructions as needed for pain.  Use the Atrovent nasal spray, 2 squirts up each nostril every 6 hours, as needed for nasal congestion.  If you develop any new or worsening symptoms please return for reevaluation or see your primary care provider.     ED Prescriptions     Medication Sig Dispense Auth. Provider   ipratropium (ATROVENT) 0.06 % nasal spray Place 2 sprays into both nostrils 4 (four) times daily. 15 mL Becky Augusta, NP      PDMP not reviewed this encounter.   Becky Augusta, NP 06/12/22 [phone]

## 2022-06-12 NOTE — Discharge Instructions (Addendum)
Your COVID test was negative but your exam is consistent with an upper respiratory infection.  Is most likely viral in nature.  Please use over-the-counter Tylenol and/or ibuprofen according to the package instructions as needed for pain.  Use the Atrovent nasal spray, 2 squirts up each nostril every 6 hours, as needed for nasal congestion.  If you develop any new or worsening symptoms please return for reevaluation or see your primary care provider.

## 2022-06-12 NOTE — ED Triage Notes (Signed)
Patient c/o nasal congestion and headache that started yesterday.  Patient unsure of fevers. Patient has been taking Sudafed and Tylenol at home.

## 2022-06-14 DIAGNOSIS — L570 Actinic keratosis: Secondary | ICD-10-CM | POA: Diagnosis not present

## 2022-06-14 DIAGNOSIS — Z85828 Personal history of other malignant neoplasm of skin: Secondary | ICD-10-CM | POA: Diagnosis not present

## 2022-06-14 DIAGNOSIS — Z859 Personal history of malignant neoplasm, unspecified: Secondary | ICD-10-CM | POA: Diagnosis not present

## 2022-06-14 DIAGNOSIS — L578 Other skin changes due to chronic exposure to nonionizing radiation: Secondary | ICD-10-CM | POA: Diagnosis not present

## 2022-06-14 DIAGNOSIS — Z872 Personal history of diseases of the skin and subcutaneous tissue: Secondary | ICD-10-CM | POA: Diagnosis not present

## 2022-06-14 DIAGNOSIS — C44519 Basal cell carcinoma of skin of other part of trunk: Secondary | ICD-10-CM | POA: Diagnosis not present

## 2022-06-14 DIAGNOSIS — D485 Neoplasm of uncertain behavior of skin: Secondary | ICD-10-CM | POA: Diagnosis not present

## 2022-06-15 DIAGNOSIS — J8410 Pulmonary fibrosis, unspecified: Secondary | ICD-10-CM | POA: Diagnosis not present

## 2022-07-04 DIAGNOSIS — L570 Actinic keratosis: Secondary | ICD-10-CM | POA: Diagnosis not present

## 2022-07-18 DIAGNOSIS — L570 Actinic keratosis: Secondary | ICD-10-CM | POA: Diagnosis not present

## 2022-08-01 DIAGNOSIS — L570 Actinic keratosis: Secondary | ICD-10-CM | POA: Diagnosis not present

## 2022-08-12 DIAGNOSIS — H401133 Primary open-angle glaucoma, bilateral, severe stage: Secondary | ICD-10-CM | POA: Diagnosis not present

## 2022-08-12 DIAGNOSIS — E113293 Type 2 diabetes mellitus with mild nonproliferative diabetic retinopathy without macular edema, bilateral: Secondary | ICD-10-CM | POA: Diagnosis not present

## 2022-08-15 DIAGNOSIS — L570 Actinic keratosis: Secondary | ICD-10-CM | POA: Diagnosis not present

## 2022-08-30 DIAGNOSIS — C4491 Basal cell carcinoma of skin, unspecified: Secondary | ICD-10-CM | POA: Diagnosis not present

## 2022-08-30 DIAGNOSIS — C44519 Basal cell carcinoma of skin of other part of trunk: Secondary | ICD-10-CM | POA: Diagnosis not present

## 2022-09-13 DIAGNOSIS — L570 Actinic keratosis: Secondary | ICD-10-CM | POA: Diagnosis not present

## 2022-09-27 DIAGNOSIS — L570 Actinic keratosis: Secondary | ICD-10-CM | POA: Diagnosis not present

## 2022-10-11 DIAGNOSIS — L570 Actinic keratosis: Secondary | ICD-10-CM | POA: Diagnosis not present

## 2022-10-13 DIAGNOSIS — E118 Type 2 diabetes mellitus with unspecified complications: Secondary | ICD-10-CM | POA: Diagnosis not present

## 2022-10-13 DIAGNOSIS — D649 Anemia, unspecified: Secondary | ICD-10-CM | POA: Diagnosis not present

## 2022-10-13 DIAGNOSIS — E7849 Other hyperlipidemia: Secondary | ICD-10-CM | POA: Diagnosis not present

## 2022-10-20 DIAGNOSIS — R972 Elevated prostate specific antigen [PSA]: Secondary | ICD-10-CM | POA: Diagnosis not present

## 2022-10-20 DIAGNOSIS — I700 Atherosclerosis of aorta: Secondary | ICD-10-CM | POA: Diagnosis not present

## 2022-10-20 DIAGNOSIS — E1151 Type 2 diabetes mellitus with diabetic peripheral angiopathy without gangrene: Secondary | ICD-10-CM | POA: Diagnosis not present

## 2022-10-20 DIAGNOSIS — Z1331 Encounter for screening for depression: Secondary | ICD-10-CM | POA: Diagnosis not present

## 2022-10-20 DIAGNOSIS — Z0000 Encounter for general adult medical examination without abnormal findings: Secondary | ICD-10-CM | POA: Diagnosis not present

## 2022-10-20 DIAGNOSIS — I10 Essential (primary) hypertension: Secondary | ICD-10-CM | POA: Diagnosis not present

## 2022-10-20 DIAGNOSIS — J849 Interstitial pulmonary disease, unspecified: Secondary | ICD-10-CM | POA: Diagnosis not present

## 2022-10-20 DIAGNOSIS — E785 Hyperlipidemia, unspecified: Secondary | ICD-10-CM | POA: Diagnosis not present

## 2023-02-08 DIAGNOSIS — L570 Actinic keratosis: Secondary | ICD-10-CM | POA: Diagnosis not present

## 2023-03-08 DIAGNOSIS — L570 Actinic keratosis: Secondary | ICD-10-CM | POA: Diagnosis not present

## 2023-03-23 DIAGNOSIS — L570 Actinic keratosis: Secondary | ICD-10-CM | POA: Diagnosis not present

## 2023-04-12 DIAGNOSIS — E118 Type 2 diabetes mellitus with unspecified complications: Secondary | ICD-10-CM | POA: Diagnosis not present

## 2023-04-12 DIAGNOSIS — D649 Anemia, unspecified: Secondary | ICD-10-CM | POA: Diagnosis not present

## 2023-04-12 DIAGNOSIS — E7849 Other hyperlipidemia: Secondary | ICD-10-CM | POA: Diagnosis not present

## 2023-04-12 DIAGNOSIS — I10 Essential (primary) hypertension: Secondary | ICD-10-CM | POA: Diagnosis not present

## 2023-04-19 DIAGNOSIS — I739 Peripheral vascular disease, unspecified: Secondary | ICD-10-CM | POA: Diagnosis not present

## 2023-04-19 DIAGNOSIS — I2510 Atherosclerotic heart disease of native coronary artery without angina pectoris: Secondary | ICD-10-CM | POA: Diagnosis not present

## 2023-04-19 DIAGNOSIS — I700 Atherosclerosis of aorta: Secondary | ICD-10-CM | POA: Diagnosis not present

## 2023-04-19 DIAGNOSIS — I70219 Atherosclerosis of native arteries of extremities with intermittent claudication, unspecified extremity: Secondary | ICD-10-CM | POA: Diagnosis not present

## 2023-04-19 DIAGNOSIS — E7849 Other hyperlipidemia: Secondary | ICD-10-CM | POA: Diagnosis not present

## 2023-04-19 DIAGNOSIS — D649 Anemia, unspecified: Secondary | ICD-10-CM | POA: Diagnosis not present

## 2023-04-19 DIAGNOSIS — J849 Interstitial pulmonary disease, unspecified: Secondary | ICD-10-CM | POA: Diagnosis not present

## 2023-04-19 DIAGNOSIS — E118 Type 2 diabetes mellitus with unspecified complications: Secondary | ICD-10-CM | POA: Diagnosis not present

## 2023-04-19 DIAGNOSIS — I10 Essential (primary) hypertension: Secondary | ICD-10-CM | POA: Diagnosis not present

## 2023-04-19 DIAGNOSIS — R972 Elevated prostate specific antigen [PSA]: Secondary | ICD-10-CM | POA: Diagnosis not present

## 2023-05-02 DIAGNOSIS — L570 Actinic keratosis: Secondary | ICD-10-CM | POA: Diagnosis not present

## 2023-05-05 DIAGNOSIS — H401133 Primary open-angle glaucoma, bilateral, severe stage: Secondary | ICD-10-CM | POA: Diagnosis not present

## 2023-05-05 DIAGNOSIS — E113293 Type 2 diabetes mellitus with mild nonproliferative diabetic retinopathy without macular edema, bilateral: Secondary | ICD-10-CM | POA: Diagnosis not present

## 2023-05-05 DIAGNOSIS — Z961 Presence of intraocular lens: Secondary | ICD-10-CM | POA: Diagnosis not present

## 2023-06-15 DIAGNOSIS — L570 Actinic keratosis: Secondary | ICD-10-CM | POA: Diagnosis not present

## 2023-11-01 ENCOUNTER — Other Ambulatory Visit: Payer: Self-pay | Admitting: Internal Medicine

## 2023-11-01 DIAGNOSIS — I10 Essential (primary) hypertension: Secondary | ICD-10-CM

## 2023-11-01 DIAGNOSIS — J849 Interstitial pulmonary disease, unspecified: Secondary | ICD-10-CM

## 2023-11-09 ENCOUNTER — Ambulatory Visit
Admission: RE | Admit: 2023-11-09 | Discharge: 2023-11-09 | Disposition: A | Discharge status: Home / Self Care | Source: Ambulatory Visit | Attending: Internal Medicine | Admitting: Internal Medicine

## 2023-11-09 DIAGNOSIS — J849 Interstitial pulmonary disease, unspecified: Secondary | ICD-10-CM | POA: Diagnosis present

## 2023-11-09 DIAGNOSIS — I10 Essential (primary) hypertension: Secondary | ICD-10-CM | POA: Diagnosis present

## 2023-12-06 DIAGNOSIS — R911 Solitary pulmonary nodule: Secondary | ICD-10-CM | POA: Diagnosis not present

## 2023-12-06 DIAGNOSIS — J449 Chronic obstructive pulmonary disease, unspecified: Secondary | ICD-10-CM | POA: Diagnosis not present

## 2024-01-10 ENCOUNTER — Other Ambulatory Visit: Payer: Self-pay | Admitting: Pulmonary Disease

## 2024-01-10 DIAGNOSIS — R911 Solitary pulmonary nodule: Secondary | ICD-10-CM

## 2024-01-26 ENCOUNTER — Ambulatory Visit
Admission: RE | Admit: 2024-01-26 | Discharge: 2024-01-26 | Disposition: A | Discharge status: Home / Self Care | Source: Ambulatory Visit | Attending: Pulmonary Disease | Admitting: Pulmonary Disease

## 2024-01-26 DIAGNOSIS — R911 Solitary pulmonary nodule: Secondary | ICD-10-CM | POA: Diagnosis present

## 2024-01-31 ENCOUNTER — Other Ambulatory Visit: Payer: Self-pay | Admitting: Pulmonary Disease

## 2024-01-31 DIAGNOSIS — C801 Malignant (primary) neoplasm, unspecified: Secondary | ICD-10-CM

## 2024-01-31 NOTE — Progress Notes (Signed)
 "                                DIVISION OF PULMONARY AND CRITICAL CARE MEDICINE                              FOLLOW UP ENCOUNTER     Chief complaint: COPD with chronic bronchitic phenotype  History of Present Illness Gerald Clark is a 79 year old male with emphysema and COPD who presents for follow-up of a recent CT scan.  He recently underwent a CT scan which revealed emphysema and COPD, consistent with his known medical history. The scan also showed scarring and inflammation around the left upper lobe.  He feels well overall, maintaining good energy levels and a strong appetite, although he is not gaining weight. He denies any feelings of being unwell or having low energy and continues to lead an active lifestyle.  His family history is significant for cancer and arthritis. Both of his sisters and his father had cancer, and one sister had a breathing problem related to smoking. His grandmother had severe rheumatoid arthritis.   Past Medical History:   Past Medical History:  Diagnosis Date   Basal cell carcinoma of skin of face 12/01/2014   Followed by [facility]   Chickenpox    Coronary artery calcification seen on CT scan 03/19/2020   Three-vessel   Diabetes mellitus type 2, uncomplicated (CMS/HHS-HCC)    Diverticulosis 06/02/2014   Glaucoma    Hx of adenomatous colonic polyps    Hyperlipidemia    Hyperplastic colon polyp 06/02/2014   Hypertension    Internal hemorrhoids 06/02/2014   PVD (peripheral vascular disease)    ABI 5/14 with 0.67 on L, nl on R - declines referral to [facility]   Tubular adenoma of colon 06/02/2014    Past Surgical History:   Past Surgical History:  Procedure Laterality Date   SIGMOIDOSCOPY  11/10/2006   GLAUCOMA EYE SURGERY  2014   COLONOSCOPY  06/02/2014   Tubular adenoma/Hyperplastic colon polyp/Repeat 67yrs/MGR   HERNIA REPAIR Left 06/2016   Left inguinal hernia repair   COLONOSCOPY  10/01/2019   Tubular  adenoma/Hyperplastic polyp/Repeat 78yrs/CTL   EGD  10/01/2019   Intestinal metaplasia/GERD/Repeat 50yrs/CTL   COLONOSCOPY  11/10/2006, 09/29/2006   Adenomas    Allergies:  No Known Allergies  Current Medications:   Prior to Admission medications  Medication Sig Taking? Last Dose  alcohol  swabs (DROPSAFE ALCOHOL  PREP PADS) PadM Apply 1 each topically 2 (two) times daily Use daily Yes Taking  aspirin  81 MG chewable tablet Take 81 mg by mouth once daily. Yes Taking  atorvastatin (LIPITOR) 80 MG tablet Take 1 tablet (80 mg total) by mouth once daily Yes Taking  blood glucose control, normal Soln Use 1 each as needed Yes Taking  blood glucose diagnostic (TRUE METRIX GLUCOSE TEST STRIP) test strip once daily Yes Taking  blood glucose diagnostic test strip 1 each (1 strip total) once daily Use as instructed. Yes Taking  blood glucose diagnostic test strip 1 each (1 strip total) once daily Use as instructed. Yes Taking  blood glucose meter kit as directed Yes Taking  blood glucose meter kit as directed Yes Taking  blood glucose meter kit as directed Yes Taking  clopidogreL  (PLAVIX ) 75 mg tablet Take 1 tablet (75 mg total) by mouth once daily Yes Taking  empagliflozin  (  JARDIANCE ) 25 mg tablet Take 1 tablet (25 mg total) by mouth daily with breakfast Yes Taking  glipiZIDE  (GLUCOTROL ) 10 MG tablet Take 1 tablet (10 mg total) by mouth 2 (two) times daily before meals Yes Taking  hydroCHLOROthiazide  (HYDRODIURIL ) 25 MG tablet Take 1 tablet (25 mg total) by mouth once daily Yes Taking  lancets 33 gauge Misc Use 1 each once daily Yes Taking  lancets Use 1 each once daily Use as instructed. Yes Taking  lisinopriL  (ZESTRIL ) 40 MG tablet Take 1 tablet (40 mg total) by mouth once daily Yes Taking  metFORMIN (GLUCOPHAGE) 1000 MG tablet Take 1 tablet (1,000 mg total) by mouth 2 (two) times daily with meals Yes Taking  pantoprazole  (PROTONIX ) 40 MG DR tablet Take 1 tablet (40 mg total) by mouth once daily  Yes Taking  niacin  500 MG tablet Take 500 mg by mouth daily with breakfast    Patient not taking: Reported on 01/31/2024  Not Taking  sildenafiL (VIAGRA) 100 MG tablet 1/2 to 1 tab PO daily as needed Patient not taking: Reported on 01/31/2024  Not Taking    Family History:   Family History  Problem Relation Name Age of Onset   Tuberculosis Mother         Died after pneumonectomy   Cancer Father     Breast cancer Sister      Social History:   Social History   Socioeconomic History   Marital status: Married  Tobacco Use   Smoking status: Former    Current packs/day: 0.00    Average packs/day: 1.5 packs/day for 40.0 years (60.0 ttl pk-yrs)    Types: Cigarettes    Start date: 04/1961    Quit date: 04/2001    Years since quitting: 22.7    Passive exposure: Past   Smokeless tobacco: Never  Substance and Sexual Activity   Alcohol  use: Yes    Alcohol /week: 4.0 standard drinks of alcohol     Types: 4 Cans of beer per week   Drug use: No   Sexual activity: Defer   Social Drivers of Corporate Investment Banker Strain: Low Risk  (12/06/2023)   Overall Financial Resource Strain (CARDIA)    Difficulty of Paying Living Expenses: Not hard at all  Food Insecurity: No Food Insecurity (12/06/2023)   Hunger Vital Sign    Worried About Running Out of Food in the Last Year: Never true    Ran Out of Food in the Last Year: Never true  Transportation Needs: No Transportation Needs (12/06/2023)   PRAPARE - Administrator, Civil Service (Medical): No    Lack of Transportation (Non-Medical): No  Housing Stability: Low Risk  (01/08/2024)   Housing Stability Vital Sign    Unable to Pay for Housing in the Last Year: No    Number of Times Moved in the Last Year: 0    Homeless in the Last Year: No    Review of Systems:   A 10 point review of systems is negative, except for the pertinent positives and negatives detailed in the HPI.  Vitals:   Vitals:   01/31/24  1049  BP: 105/61  Pulse: 95  SpO2: 94%  Weight: 65.3 kg (144 lb)  Height: 165.1 cm (5' 5)     Body mass index is 23.96 kg/m.  Physical Exam:   Physical Exam Vitals and nursing note reviewed.  Constitutional:      General: in no acute distress.    Appearance: Normal appearance. Is  not ill-appearing, toxic-appearing or diaphoretic.  HENT:     Head: Normocephalic and atraumatic.     Right Ear: External ear normal.     Left Ear: External ear normal.  Eyes:     General:        Right eye: No discharge.        Left eye: No discharge.     Extraocular Movements: Extraocular movements intact.     Pupils: Pupils are equal, round, and reactive to light.  Cardiovascular:     Rate and Rhythm: Normal rate and regular rhythm.     Pulses: Normal pulses.     Heart sounds: Normal heart sounds. No murmur heard.    No friction rub. No gallop.  Abdominal:     General: Bowel sounds are normal.  Skin:    General: Skin is warm and dry.     Capillary Refill: Capillary refill takes less than 2 seconds.  Neurological:     Mental Status: Patient is alert.     Lab and Imaging Results:   Results Radiology Chest CT (10/2023): Emphysema and chronic obstructive pulmonary disease; pulmonary fibrosis; mild left upper lobe inflammation; no evidence of malignancy; stable compared to prior imaging (Independently interpreted)    Assessment and Plan:   Diagnoses and all orders for this visit:  Occult malignancy (CMS/HHS-HCC) -     PET SBMT with nondiagnostic concurrent CT subsequent FDG (F18 fluorodeoxyglucose) - [id number]; Future  Pulmonary fibrosis (CMS/HHS-HCC)  Lung nodules    Assessment & Plan Left upper lobe pulmonary nodule with scarring, under evaluation for occult malignancy CT scan reveals emphysema, COPD, and scarring, with inflammation around the left upper lobe. The nodule is likely scar tissue with no significant changes. No current symptoms suggestive of malignancy. Family history  of cancer necessitates further evaluation. - Ordered PET scan to evaluate for occult malignancy and assess for active disease or cancer cells. - Scheduled follow-up appointment in three months.  COPD with chronic bronchitic phenotype    Stage 2 currently     On inhaler therapy     Currently with mMRC 2    COPD Asessment test is 16 today     I spent 41 minutes in both face-to-face and non-face-to-face activities including reviewing history, performing an exam and evaluation, entering clinical information EHR, interpreting results, counseling patient/family/caregiver, reviewing x-rays/CT scans/MRI/labs/echocardiography/PFTs, ordering meds/tests/procedures, referring and communicating with consulting healthcare professionals and care coordination. This does not include time spent with staff.    The patient and/or family voices understanding of the plans. All questions and concerns were answered. The patient and /or family was instructed to call if the patient needs to be seen sooner. Thank you for allowing me to participate in the care of this patient. Do not hesitate to contact me via Epic or by calling our office at [phone] with any questions or concerns.     This note has been created using dictation software tool and any typographical errors are purely unintentional.  Patient received an After Visit Summary     "

## 2024-02-07 ENCOUNTER — Ambulatory Visit
Admission: RE | Admit: 2024-02-07 | Discharge: 2024-02-07 | Disposition: A | Discharge status: Home / Self Care | Source: Ambulatory Visit | Attending: Pulmonary Disease | Admitting: Pulmonary Disease

## 2024-02-07 DIAGNOSIS — C801 Malignant (primary) neoplasm, unspecified: Secondary | ICD-10-CM

## 2024-02-07 DIAGNOSIS — J984 Other disorders of lung: Secondary | ICD-10-CM | POA: Insufficient documentation

## 2024-02-07 DIAGNOSIS — I700 Atherosclerosis of aorta: Secondary | ICD-10-CM | POA: Diagnosis not present

## 2024-02-07 DIAGNOSIS — K8020 Calculus of gallbladder without cholecystitis without obstruction: Secondary | ICD-10-CM | POA: Diagnosis not present

## 2024-02-07 DIAGNOSIS — R918 Other nonspecific abnormal finding of lung field: Secondary | ICD-10-CM | POA: Insufficient documentation

## 2024-02-07 DIAGNOSIS — I999 Unspecified disorder of circulatory system: Secondary | ICD-10-CM | POA: Insufficient documentation

## 2024-02-07 LAB — GLUCOSE, CAPILLARY: Glucose-Capillary: 116 mg/dL — ABNORMAL HIGH (ref 70–99)

## 2024-02-07 MED ORDER — FLUDEOXYGLUCOSE F - 18 (FDG) INJECTION
8.8900 | Freq: Once | INTRAVENOUS | Status: AC | PRN
  Administered 2024-02-07: 8.89 via INTRAVENOUS
